# Patient Record
Sex: Female | Born: 1935 | Race: White | Hispanic: No | Marital: Married | Smoking: Never smoker
Health system: Southern US, Community
[De-identification: ages and names within clinical notes are randomized; demographics above are authoritative.]

## PROBLEM LIST (undated history)

## (undated) DIAGNOSIS — I639 Cerebral infarction, unspecified: Secondary | ICD-10-CM

## (undated) DIAGNOSIS — R569 Unspecified convulsions: Secondary | ICD-10-CM

## (undated) HISTORY — DX: Cerebral infarction, unspecified: I63.9

## (undated) HISTORY — PX: APPENDECTOMY: SHX54

## (undated) HISTORY — DX: Unspecified convulsions: R56.9

## (undated) HISTORY — PX: BREAST BIOPSY: SHX20

## (undated) HISTORY — PX: SHOULDER SURGERY: SHX246

---

## 2003-12-21 ENCOUNTER — Emergency Department: Payer: Self-pay | Admitting: Emergency Medicine

## 2003-12-21 ENCOUNTER — Other Ambulatory Visit: Payer: Self-pay

## 2004-05-08 ENCOUNTER — Ambulatory Visit: Payer: Self-pay | Admitting: Internal Medicine

## 2005-09-24 ENCOUNTER — Emergency Department: Payer: Self-pay | Admitting: Emergency Medicine

## 2005-09-24 ENCOUNTER — Other Ambulatory Visit: Payer: Self-pay

## 2005-12-10 ENCOUNTER — Ambulatory Visit: Payer: Self-pay | Admitting: Internal Medicine

## 2005-12-25 ENCOUNTER — Ambulatory Visit: Payer: Self-pay | Admitting: Internal Medicine

## 2006-01-08 ENCOUNTER — Ambulatory Visit: Payer: Self-pay | Admitting: Internal Medicine

## 2006-07-04 ENCOUNTER — Ambulatory Visit: Payer: Self-pay | Admitting: Internal Medicine

## 2008-04-05 ENCOUNTER — Ambulatory Visit: Payer: Self-pay | Admitting: Internal Medicine

## 2008-08-04 ENCOUNTER — Ambulatory Visit: Payer: Self-pay | Admitting: Internal Medicine

## 2009-06-05 ENCOUNTER — Ambulatory Visit: Payer: Self-pay | Admitting: Internal Medicine

## 2010-04-09 ENCOUNTER — Inpatient Hospital Stay: Payer: Self-pay | Admitting: Internal Medicine

## 2010-04-11 ENCOUNTER — Encounter: Payer: Self-pay | Admitting: Internal Medicine

## 2010-04-12 ENCOUNTER — Encounter: Payer: Self-pay | Admitting: Internal Medicine

## 2010-05-13 ENCOUNTER — Encounter: Payer: Self-pay | Admitting: Internal Medicine

## 2010-06-12 ENCOUNTER — Encounter: Payer: Self-pay | Admitting: Internal Medicine

## 2010-06-14 ENCOUNTER — Encounter: Payer: Self-pay | Admitting: Internal Medicine

## 2010-07-13 ENCOUNTER — Encounter: Payer: Self-pay | Admitting: Internal Medicine

## 2010-08-12 ENCOUNTER — Encounter: Payer: Self-pay | Admitting: Internal Medicine

## 2010-09-12 ENCOUNTER — Encounter: Payer: Self-pay | Admitting: Internal Medicine

## 2011-05-14 ENCOUNTER — Ambulatory Visit: Payer: Self-pay | Admitting: Internal Medicine

## 2011-06-09 LAB — COMPREHENSIVE METABOLIC PANEL
Albumin: 3.7 g/dL (ref 3.4–5.0)
Anion Gap: 8 (ref 7–16)
BUN: 11 mg/dL (ref 7–18)
Bilirubin,Total: 0.2 mg/dL (ref 0.2–1.0)
Calcium, Total: 8.1 mg/dL — ABNORMAL LOW (ref 8.5–10.1)
Chloride: 102 mmol/L (ref 98–107)
Creatinine: 0.71 mg/dL (ref 0.60–1.30)
EGFR (African American): >60
EGFR (Non-African Amer.): >60
Glucose: 84 mg/dL (ref 65–99)
Potassium: 4.1 mmol/L (ref 3.5–5.1)
SGOT(AST): 37 U/L (ref 15–37)
SGPT (ALT): 39 U/L
Sodium: 135 mmol/L — ABNORMAL LOW (ref 136–145)

## 2011-06-09 LAB — URINALYSIS, COMPLETE
Bacteria: NONE SEEN
Bilirubin,UR: NEGATIVE
Blood: NEGATIVE
Glucose,UR: NEGATIVE mg/dL (ref 0–75)
Ketone: NEGATIVE
Ph: 7 (ref 4.5–8.0)
Specific Gravity: 1.003 (ref 1.003–1.030)
Squamous Epithelial: 1

## 2011-06-09 LAB — TROPONIN I: Troponin-I: <0.02 ng/mL

## 2011-06-09 LAB — CBC
HCT: 41.6 % (ref 35.0–47.0)
HGB: 13.9 g/dL (ref 12.0–16.0)
MCH: 31.1 pg (ref 26.0–34.0)
MCHC: 33.3 g/dL (ref 32.0–36.0)
MCV: 93 fL (ref 80–100)
Platelet: 197 10*3/uL (ref 150–440)
RDW: 13.1 % (ref 11.5–14.5)

## 2011-06-09 LAB — CK TOTAL AND CKMB (NOT AT ARMC): CK, Total: 78 U/L (ref 21–215)

## 2011-06-09 LAB — PHENYTOIN LEVEL, TOTAL: Dilantin: 19.6 ug/mL (ref 10.0–20.0)

## 2011-06-10 ENCOUNTER — Observation Stay: Payer: Self-pay | Admitting: *Deleted

## 2011-06-10 LAB — CK TOTAL AND CKMB (NOT AT ARMC)
CK, Total: 66 U/L (ref 21–215)
CK-MB: 1.7 ng/mL (ref 0.5–3.6)
CK-MB: 1.5 ng/mL (ref 0.5–3.6)

## 2011-06-10 LAB — PHENOBARBITAL LEVEL: Phenobarbital: 37.3 ug/mL (ref 15.0–40.0)

## 2011-06-11 LAB — MAGNESIUM: Magnesium: 1.9 mg/dL

## 2011-06-11 LAB — CBC WITH DIFFERENTIAL/PLATELET
Basophil #: 0.1 10*3/uL (ref 0.0–0.1)
Basophil %: 1.3 %
Eosinophil #: 0.3 10*3/uL (ref 0.0–0.7)
Eosinophil %: 6 %
HCT: 40.6 % (ref 35.0–47.0)
HGB: 13.7 g/dL (ref 12.0–16.0)
Lymphocyte #: 1.6 10*3/uL (ref 1.0–3.6)
MCH: 31.1 pg (ref 26.0–34.0)
MCV: 92 fL (ref 80–100)
Monocyte #: 0.6 x10 3/mm (ref 0.2–0.9)
Neutrophil %: 41.1 %
RBC: 4.41 10*6/uL (ref 3.80–5.20)

## 2011-06-11 LAB — BASIC METABOLIC PANEL
Anion Gap: 7 (ref 7–16)
BUN: 7 mg/dL (ref 7–18)
Calcium, Total: 8 mg/dL — ABNORMAL LOW (ref 8.5–10.1)
Creatinine: 0.73 mg/dL (ref 0.60–1.30)
EGFR (African American): >60
Glucose: 75 mg/dL (ref 65–99)
Potassium: 4.6 mmol/L (ref 3.5–5.1)

## 2011-06-11 LAB — LIPID PANEL
HDL Cholesterol: 63 mg/dL — ABNORMAL HIGH (ref 40–60)
VLDL Cholesterol, Calc: 16 mg/dL (ref 5–40)

## 2011-06-11 LAB — PHENYTOIN LEVEL, TOTAL: Dilantin: 15.6 ug/mL (ref 10.0–20.0)

## 2011-08-19 ENCOUNTER — Other Ambulatory Visit: Payer: Self-pay | Admitting: Family Medicine

## 2011-08-19 LAB — COMPREHENSIVE METABOLIC PANEL
Anion Gap: 7 (ref 7–16)
Chloride: 100 mmol/L (ref 98–107)
Co2: 28 mmol/L (ref 21–32)
EGFR (African American): >60
EGFR (Non-African Amer.): >60
Osmolality: 269 (ref 275–301)
SGOT(AST): 38 U/L — ABNORMAL HIGH (ref 15–37)
SGPT (ALT): 40 U/L
Sodium: 135 mmol/L — ABNORMAL LOW (ref 136–145)

## 2011-08-19 LAB — CBC WITH DIFFERENTIAL/PLATELET
Basophil %: 1.2 %
Eosinophil %: 3 %
HCT: 41.2 % (ref 35.0–47.0)
HGB: 13.7 g/dL (ref 12.0–16.0)
Lymphocyte #: 1.7 10*3/uL (ref 1.0–3.6)
Lymphocyte %: 29.2 %
Monocyte %: 11.9 %
Neutrophil %: 54.7 %
Platelet: 271 10*3/uL (ref 150–440)
RBC: 4.4 10*6/uL (ref 3.80–5.20)
WBC: 6 10*3/uL (ref 3.6–11.0)

## 2012-05-14 ENCOUNTER — Ambulatory Visit: Payer: Self-pay | Admitting: Internal Medicine

## 2012-09-15 ENCOUNTER — Ambulatory Visit: Payer: Self-pay | Admitting: Internal Medicine

## 2013-06-23 ENCOUNTER — Ambulatory Visit: Payer: Self-pay | Admitting: Internal Medicine

## 2013-07-02 ENCOUNTER — Ambulatory Visit: Payer: Self-pay | Admitting: Internal Medicine

## 2013-09-06 ENCOUNTER — Ambulatory Visit: Payer: Self-pay | Admitting: Internal Medicine

## 2013-11-21 ENCOUNTER — Emergency Department: Payer: Self-pay | Admitting: Emergency Medicine

## 2013-11-21 LAB — CBC WITH DIFFERENTIAL/PLATELET
Basophil #: 0 10*3/uL (ref 0.0–0.1)
Basophil %: 0.5 %
EOS PCT: 1.2 %
Eosinophil #: 0.1 10*3/uL (ref 0.0–0.7)
HCT: 47 % (ref 35.0–47.0)
HGB: 15.4 g/dL (ref 12.0–16.0)
Lymphocyte #: 1.5 10*3/uL (ref 1.0–3.6)
Lymphocyte %: 17.5 %
MCH: 30.6 pg (ref 26.0–34.0)
MCHC: 32.7 g/dL (ref 32.0–36.0)
MCV: 94 fL (ref 80–100)
MONO ABS: 1.1 x10 3/mm — AB (ref 0.2–0.9)
MONOS PCT: 12.4 %
NEUTROS ABS: 5.8 10*3/uL (ref 1.4–6.5)
Neutrophil %: 68.4 %
PLATELETS: 248 10*3/uL (ref 150–440)
RBC: 5.01 10*6/uL (ref 3.80–5.20)
RDW: 12.7 % (ref 11.5–14.5)
WBC: 8.5 10*3/uL (ref 3.6–11.0)

## 2013-11-21 LAB — URINALYSIS, COMPLETE
BLOOD: NEGATIVE
Bilirubin,UR: NEGATIVE
Glucose,UR: NEGATIVE mg/dL (ref 0–75)
Ketone: NEGATIVE
NITRITE: NEGATIVE
PROTEIN: NEGATIVE
Ph: 7 (ref 4.5–8.0)
RBC,UR: 2 /HPF (ref 0–5)
Specific Gravity: 1.006 (ref 1.003–1.030)
Squamous Epithelial: 1
WBC UR: 12 /HPF (ref 0–5)

## 2013-11-21 LAB — BASIC METABOLIC PANEL
ANION GAP: 6 — AB (ref 7–16)
BUN: 9 mg/dL (ref 7–18)
CO2: 27 mmol/L (ref 21–32)
CREATININE: 0.88 mg/dL (ref 0.60–1.30)
Calcium, Total: 8.3 mg/dL — ABNORMAL LOW (ref 8.5–10.1)
Chloride: 107 mmol/L (ref 98–107)
EGFR (Non-African Amer.): >60
Glucose: 111 mg/dL — ABNORMAL HIGH (ref 65–99)
Osmolality: 279 (ref 275–301)
Potassium: 3.9 mmol/L (ref 3.5–5.1)
SODIUM: 140 mmol/L (ref 136–145)

## 2013-11-21 LAB — TROPONIN I

## 2014-06-05 NOTE — Discharge Summary (Signed)
PATIENT NAME:  Brittany Hogan, Brittany Hogan MR#:  [id number] DATE OF BIRTH:  05/16/1935  DATE OF ADMISSION:  06/10/2011 DATE OF DISCHARGE:  06/11/2011  DISCHARGE DIAGNOSES:  1. Lower extremity weakness, most likely secondary to Lipitor. Stroke ruled out. MRI of brain negative for acute ischemia.  2. Seizure disorder. 3. History of left thalamic infarct in February 2012.  4. History of anxiety and depression. 5. History of hyperlipidemia.  CONSULTANTS: Suzan SlickPeter Clarke, MD - Neurology.  HOSPITAL COURSE: This is a 79 year old female who has history of seizure disorder, hyperlipidemia, and history of cerebrovascular accident in February with left thalamic infarct with some residual right-sided weakness, also history of anxiety and depression. She presented with lower extremity weakness going on for 2 to 3 weeks. She says that her legs gave away. She denies any back pain or any lower extremity pain or numbness. She was taking Lipitor in the past and her primary care physician suggested to stop her Lipitor, so she stopped her Lipitor. She was admitted as lower extremity weakness to rule out any stroke. When she came in, she had a CT of the head done that was negative for any acute abnormality. She had a MRI of the brain done and it showed chronic ischemic changes, no acute intracranial abnormality. Diffusion weighted images were negative, no evidence of acute ischemia . We checked vitamin B12 levels on her and her B12 level is normal at 403. We also checked Dilantin levels for her and they are therapeutic, not in the toxic range. Her Dilantin level when she came was 19.6 and today is 15.6. Her phenobarbital levels were also therapeutic, in the range of 37.3. She ruled out for myocardial infarction. Her LFTs are normal. Her creatinine is stable at 0.7. Her LDL is stable at 72. She already stopped Lipitor at home. Her urinalysis has been negative for any infection. Neurology, Dr. Suzan SlickPeter Clarke, saw her and thinks that this is  possibly a side effect of Lipitor, stroke has been ruled out, and this is unlikely secondary to Dilantin toxicity considering the therapeutic Dilantin levels. The patient was evaluated by physical therapy today and she did extremely well. She did walk 300 feet with physical therapy and they suggested that the patient does not have any skilled needs at home and they only suggested home with assist. The patient feels better. Her lower extremity weakness is better. She uses a cane at home.  DISCHARGE MEDICATIONS: Home Medications: 1. Sertraline 50 mg daily. 2. Dilantin 100 mg three times daily, extended-release. 3. Phenobarbital 32.4 mg two tablets in the morning and three at night.  4. Aspirin 325 mg daily. 5. Alendronate 70 mg once a week.   NOTE: Do not take Lipitor.   DIET: Low cholesterol diet.   CONDITION AT DISCHARGE: She is comfortable, no acute distress. She walked with physical therapy 300 feet. T-max 98.7, heart rate 69, blood pressure 122/74, and saturating 96% on room air. Chest is clear. Heart sounds are regular. Abdomen is soft and nontender. She is awake, alert, and oriented x3. She has minimal residual right lower extremity weakness from her prior stroke.   DISCHARGE FOLLOWUP: The patient should follow-up with Dr. Dewaine Oatsenny Tate in 1 to 2 weeks.   TIME SPENT ON DISCHARGE: 40 minutes. ____________________________ Fredia SorrowAbhinav Bethene Hankinson, MD ag:slb D: 06/11/2011 09:50:24 ET T: 06/11/2011 12:49:46 ET JOB#: [id number]  cc: Fredia SorrowAbhinav Rily Nickey, MD, <Dictator> Jillene Bucksenny C. Arlana Pouchate, MD Fredia SorrowABHINAV Denora Wysocki MD ELECTRONICALLY SIGNED 06/19/2011 12:01

## 2014-06-05 NOTE — H&P (Signed)
PATIENT NAME:  Brittany Hogan, Brittany Hogan MR#:  [id number] DATE OF BIRTH:  04-28-1935  DATE OF ADMISSION:  06/10/2011  REFERRING PHYSICIAN: ER physician, Dr. Carollee MassedKaminski PRIMARY CARE PHYSICIAN: Dr. Arlana Pouchate   CHIEF COMPLAINT: Lower extremity weakness.   HISTORY OF PRESENT ILLNESS: Patient is a 79 year old female with past medical history of prior cerebrovascular accident, seizures, anxiety who reports that she has been having progressive bilateral lower extremity weakness for the past 2 to 3 weeks. Her physician thought that it may be related to her Lipitor therefore it was discontinued two weeks ago. Patient reports that she was more or less at her baseline today, did all her usual activities, went to church, however, after dinner she developed sudden onset of extreme bilateral lower extremity weakness, with her legs giving under her and being unable to bear her weight. She denies having any recent falls and inability to bear weight. She reports that her legs are just giving out on her. The ER physician tried ambulating the patient and patient was unable to ambulate in the ED.   PAST MEDICAL HISTORY: 1. Seizures. 2. Hyperlipidemia, patient was taken off statin recently because it was thought to be contributing to her symptoms.  3. Anxiety, depression.  4. History of cerebrovascular accident. MRI of the brain done in February 2012 showed left thalamic infarct. Patient has some residual right-sided weakness. Her carotid ultrasound and echo done at admission were normal.   ALLERGIES: No known drug allergies.   CURRENT MEDICATIONS:  1. Zoloft 50 mg daily.  2. Dilantin 100 mg t.i.d.  3. Phenobarbital 32.4 mg 2 tablets in the morning and 3 tablets in the evening. 4. Aspirin 325 mg daily.  5. Alendronate 70 mg once a week.  6. Patient was taking Lipitor 40 mg a day which was stopped by her PCP about two weeks ago.   PAST SURGICAL HISTORY: Appendectomy.   FAMILY HISTORY: Mother died at the age of 77 of old age.  Father died of myocardial infarction at the age of 77.   SOCIAL HISTORY: Patient is married, lives with her husband. There is no history of smoking, alcohol or drug abuse.   REVIEW OF SYSTEMS: CONSTITUTIONAL: Denies any fever, fatigue. EYES: Denies any blurred or double vision. ENT: Denies any tinnitus, ear pain. RESPIRATORY: Denies any coughing, wheezing. CARDIOVASCULAR: Denies any chest pain, palpitations. GASTROINTESTINAL: Denies any nausea, vomiting, diarrhea, abdominal pain. GENITOURINARY: Denies in dysuria, hematuria. ENDO: Denies any polyuria, nocturia. HEME/LYMPH: Denies any easy bruisability. INTEGUMENT: Denies any acne, rash. MUSCULOSKELETAL: Denies any swelling, gout. NEUROLOGICAL: Denies any dysarthria. Has a prior history of cerebrovascular accident. Reports bilateral lower extremity weakness. PSYCH: Has history of anxiety and depression.   PHYSICAL EXAMINATION:  VITAL SIGNS: Temperature 98.5, heart rate 69, respiratory rate 20, blood pressure 164/89, pulse oximetry 95% on room air.   GENERAL: Patient is a 79 year old Caucasian female lying comfortably in bed, not in acute distress.   HEAD: Atraumatic, normocephalic.   EYES: There is no pallor, icterus, or cyanosis. Pupils are equal, round, and reactive to light and accommodation. Extraocular movements intact.   ENT: Wet mucous membranes. No oropharyngeal erythema or thrush.   NECK: Supple. No masses. No JVD. No thyromegaly. No lymphadenopathy.   CHEST WALL: No tenderness to palpation. Not using accessory muscles of respiration. No intercostal muscle retractions.   LUNGS: Bilaterally clear to auscultation. No wheezing, rales, or rhonchi.   CARDIOVASCULAR: S1, S2 regular. No murmur, rubs, or gallops.   ABDOMEN: Soft, nontender, nondistended. No guarding. No  rigidity. No organomegaly. Normal bowel sounds.   SKIN: No rashes or lesions.   PERIPHERIES: No pedal edema. 2+ pedal pulses.   MUSCULOSKELETAL: No cyanosis or clubbing.    NEUROLOGIC: Patient is awake, alert, oriented x3. Motor strength 5/5 in upper extremities. Cranial nerves grossly intact. In bed patient appears to have motor strength of 5/5 in her lower extremities with intact position sense.   PSYCH: Normal mood and affect.   LABORATORY, DIAGNOSTIC AND RADIOLOGICAL DATA: White count normal. Cardiac enzymes negative. EKG normal. Complete metabolic panel essentially normal. Urinalysis shows no evidence of infection. Dilantin level 19.6. CT of the head shows no acute abnormalities.   ASSESSMENT AND PLAN: 79 year old female with history of seizure disorder, anxiety, depression, prior CVA presents with bilateral lower extremity weakness. 1. Bilateral lower extremity weakness. Patient reported that she has some weakness in her right lower extremity from her prior stroke in February 2012. The current symptoms could be related to cerebrovascular accident/transient ischemic attack but could also be related to Dilantin. Her Dilantin level is therapeutic but it is in the high normal range. No changes in the dose was made recently. Will obtain MRI of the brain, PT consultation. Continued her aspirin. Will get a neurology consultation. Repeat Dilantin level in a.m. Will not repeat a 2-D echo or carotid ultrasound because they were essentially normal in February 2012.  2. Seizure disorder. Will continue Dilantin and phenobarbital.  3. Hyperlipidemia. Patient reports that her Lipitor was discontinued about two weeks ago because it was felt to be contributing to her symptoms. Will hold her Lipitor for the time being and check a fasting lipid profile.  4. Will place on GI and deep vein thrombosis prophylaxis.   Reviewed old medical records, discussed with the ED physician, discussed with the patient and husband the plan of care and management.   TIME SPENT: 75 minutes.   ____________________________ Darrick Meigs, MD sp:cms D: 06/10/2011 00:30:45 ET T: 06/10/2011  08:44:19 ET JOB#: [id number]  cc: Darrick Meigs, MD, <Dictator> Jillene Bucks. Arlana Pouch, MD Darrick Meigs MD ELECTRONICALLY SIGNED 06/15/2011 6:18

## 2014-06-05 NOTE — Consult Note (Signed)
PATIENT NAME:  Brittany Hogan, Brittany Hogan MR#:  [id number] DATE OF BIRTH:  06/02/1935  DATE OF CONSULTATION:  06/10/2011  REFERRING PHYSICIAN:  Dr. Dava Hogan CONSULTING PHYSICIAN:  Brittany PhiPeter R. Kemper Durielarke, MD  HISTORY: Brittany Hogan is a 79 year old right-handed married white patient of Dr. Dewaine Oatsenny Hogan, a former Surveyor, miningpoultry worker with history of Dilantin and phenobarbital treatment for chronic temporal lobe epilepsy, history of anxiety and depression, remote Bell'Hogan palsy in November 2005, and history of  atherosclerotic cerebrovascular disease status post 04/08/2010 left lateral thalamus infarction leaving mild right upper more than lower extremity weakness. The patient is now admitted 06/10/2011 and is referred for evaluation of lower extremity weakness. History comes from the patient, her hospital chart, and her hospital records.   The patient was brought to the Emergency Room at 8:00 p.m. on 06/09/2011 by EMTs summoned to her home secondary to leg weakness. She reports the onset approximately three weeks prior to admission of a feeling of weakness of her legs which gradually worsened to the point that, on getting up from sitting in the afternoon of 06/09/2011, she was unable to walk safely for fear of falling, secondary to legs feeling too weak.   She reports she was taken off of Lipitor 40 mg a day 05/28/2011 by Dr. Arlana Pouchate because of developing weakness feeling of her legs.  Lipitor had been started during her admission to [facility] February to March 2012 for stroke prevention. Her lipid values at that time included cholesterol 177, triglycerides 50, HDL 67 (range 40-60). Along with feeling of weakness of the legs in the past few weeks, the patient denies feeling of weakness of her upper extremities or her face or neck, trouble chewing or swallowing, double vision, eyelids droop, change of vision or hearing, change of bowel/ bladder habits, pain of her legs or arms, or numbness of her limbs or face. She denies vertigo, lightheadedness, or  feeling of off balance with the exception of a feeling a risk of falling secondary to feeling of weakness of her legs or feeling "weak all over". She denies shortness of breath, chest pressure or pain, lower extremity swelling, orthopnea, or PND. Other than discontinuation of Lipitor, she has had no recent change of prescriptions. She remains on phenobarbital 65 mg in the morning, and 100 mg at night, and on Dilantin 100 mg three times a day. Her Dilantin level on admission was 19.6 with albumin 3.7. On 04/09/2010, Dilantin was 18.5 with albumin 3.7. She denies any recent illness, denies having cold or flu symptoms. Brain MRI scan performed this morning showed no acute findings.   EXAMINATION: The patient is a well-developed and mild to moderately overweight elderly white woman who was examined initially lying semisupine, in no apparent distress with blood pressure 140/75 and heart rate 64. There was no fever. She was normocephalic without evidence of trauma. Her neck was supple. Mental status was normal, though cognitive testing was not performed in detail. She was alert and oriented with clear speech and normal expression, and was lucid and a good historian with normal affect. Cranial nerve examination was normal with the exception of very mild asymmetry of facial appearance with lesser nasolabial fold on the right and palpebral fissure a little greater on the left than the right. Eye movements were normal and visual fields were full to finger count for each eye.   On motor examination of the extremities, there was mild increase of tone in the right arm and leg. Strength was five out of five throughout bilaterally  in the arms and legs with the exception of slight weakness of right hand grip and of right ankle dorsiflexion. Reflexes were asymmetric, rated 3+ on the right and only 2+ on the left in the upper extremities, 2 to 3 on the right and 1 to 2 on the left at the knees, 1 on the right versus trace on the  left at the ankles. On coordination examination, there was mild right side hand and foot tapping dysrhythmia, and very mild right finger-to-nose dystaxia, mild impairment of right hand fine finger movements. She was able to sit at the side of the bed without complaint, required one hand assistance to stand and slowly walk forward and backward, reporting feeling of weakness of her legs. With one hand balancing assistance the patient was able to stand on each foot alone.   IMPRESSION:  1. History of remote Bell'Hogan palsy with very mild residual facial asymmetry.  2. History of 04/08/2010 left lateral thalamus infarction leaving mild residual right upper lower extremity impairment of coordination and very mild weakness of right hand grip and ankle dorsiflexion.  3. Recent subacute onset, of approximately three weeks, of problems with feeling of weakness of the legs, suggestive of a side effect of Lipitor treatment. I have low suspicion that this reflects feeling of poor gait steadiness associated with high therapeutic Dilantin level.  Her examination at this point does not suggest myasthenia or other neuromuscular disorder.   RECOMMENDATIONS:  1. Her phenobarbital level will be checked.  2. I agree with her present work-up and treatment in the hospital including imaging and laboratory studies and physical therapy referral.  3. I recommend continuing off of statin treatment.  4. Hold for now on consideration of decreasing her daily Dilantin dose.      I appreciate being asked to see this pleasant and interesting lady.      ____________________________ Brittany Hogan. Kemper Durie, MD prc:bjt D: 06/10/2011 14:20:53 ET T: 06/10/2011 15:44:37 ET JOB#: [id number]  cc: Brittany Hogan. Kemper Durie, MD, <Dictator> Jillene Bucks. Arlana Pouch, MD Gaspar Garbe MD ELECTRONICALLY SIGNED 06/13/2011 16:07

## 2014-06-27 ENCOUNTER — Other Ambulatory Visit: Payer: Self-pay | Admitting: Internal Medicine

## 2014-06-27 DIAGNOSIS — Z1231 Encounter for screening mammogram for malignant neoplasm of breast: Secondary | ICD-10-CM

## 2014-07-08 ENCOUNTER — Ambulatory Visit
Admission: RE | Admit: 2014-07-08 | Discharge: 2014-07-08 | Disposition: A | Discharge status: Home / Self Care | Payer: Medicare HMO | Source: Ambulatory Visit | Attending: Internal Medicine | Admitting: Internal Medicine

## 2014-07-08 ENCOUNTER — Other Ambulatory Visit: Payer: Self-pay | Admitting: Internal Medicine

## 2014-07-08 DIAGNOSIS — Z1231 Encounter for screening mammogram for malignant neoplasm of breast: Secondary | ICD-10-CM | POA: Diagnosis present

## 2014-09-14 ENCOUNTER — Other Ambulatory Visit: Payer: Self-pay | Admitting: Internal Medicine

## 2014-09-14 DIAGNOSIS — R109 Unspecified abdominal pain: Secondary | ICD-10-CM

## 2014-09-16 ENCOUNTER — Ambulatory Visit
Admission: RE | Admit: 2014-09-16 | Discharge: 2014-09-16 | Disposition: A | Discharge status: Home / Self Care | Payer: Medicare HMO | Source: Ambulatory Visit | Attending: Internal Medicine | Admitting: Internal Medicine

## 2014-09-16 DIAGNOSIS — R109 Unspecified abdominal pain: Secondary | ICD-10-CM | POA: Diagnosis present

## 2014-09-16 DIAGNOSIS — R160 Hepatomegaly, not elsewhere classified: Secondary | ICD-10-CM | POA: Diagnosis not present

## 2014-09-21 ENCOUNTER — Encounter: Payer: Self-pay | Admitting: General Surgery

## 2014-09-21 ENCOUNTER — Other Ambulatory Visit: Payer: Medicare HMO

## 2014-09-21 ENCOUNTER — Ambulatory Visit (INDEPENDENT_AMBULATORY_CARE_PROVIDER_SITE_OTHER): Payer: Medicare HMO | Admitting: General Surgery

## 2014-09-21 DIAGNOSIS — K8010 Calculus of gallbladder with chronic cholecystitis without obstruction: Secondary | ICD-10-CM

## 2014-09-21 NOTE — Patient Instructions (Addendum)

## 2014-09-21 NOTE — Progress Notes (Signed)
Patient ID: Brittany Hogan, female   DOB: 12/08/1935, 79 y.o.   MRN: [id number]  Chief Complaint  Patient presents with  . Other    gallbladder    HPI Brittany Hogan is a 79 y.o. female here today for a evaluation of her gallbladder. Patient states she has been going on for about 3 weeks. Right upper abdomen pain that comes and go. She states it last about 10-30 minutes and occurs during the daytime. Not associated with any particular foods. She did have an abdominal ultrasound last week.  HPI  Past Medical History  Diagnosis Date  . Stroke   . Seizures     Past Surgical History  Procedure Laterality Date  . Breast biopsy Right     neg  . Appendectomy      History reviewed. No pertinent family history.  Social History Social History  Substance Use Topics  . Smoking status: Never Smoker   . Smokeless tobacco: Never Used  . Alcohol Use: No    No Known Allergies  Current Outpatient Prescriptions  Medication Sig Dispense Refill  . aspirin 325 MG tablet Take 325 mg by mouth daily.    Marland Kitchen DILANTIN 100 MG ER capsule Take 1 tablet by mouth daily.    Marland Kitchen PHENobarbital (LUMINAL) 64.8 MG tablet Take 1 tablet by mouth 2 (two) times daily.     No current facility-administered medications for this visit.    Review of Systems Review of Systems  Constitutional: Negative.   Respiratory: Negative.   Cardiovascular: Negative.   Gastrointestinal: Positive for constipation. Negative for nausea, vomiting and diarrhea.    Blood pressure 118/64, height  (1.575 m), weight 173 lb (78.472 kg).  Physical Exam Physical Exam  Constitutional: She is oriented to person, place, and time. She appears well-developed and well-nourished.  HENT:  Mouth/Throat: Oropharynx is clear and moist.  Eyes: Conjunctivae are normal. No scleral icterus.  Neck: Neck supple.  Cardiovascular: Normal rate, regular rhythm and normal heart sounds.   Pulmonary/Chest: Effort normal and breath sounds normal.   Abdominal: Soft. Normal appearance and bowel sounds are normal. There is tenderness.  Lymphadenopathy:    She has no cervical adenopathy.  Neurological: She is alert and oriented to person, place, and time.  Skin: Skin is warm and dry.  Psychiatric: Her behavior is normal.    Data Reviewed Notes and ultrasound reviewed.  CBC and MetC from 7/19 normal  Assessment    Chronic cholecystitis and cholelithiasis. Patient seems to have pain consistent with biliary colic      Plan    Laparoscopic Cholecystectomy with Intraoperative Cholangiogram. The procedure, including it's potential risks and complications (including but not limited to infection, bleeding, injury to intra-abdominal organs or bile ducts, bile leak, poor cosmetic result, sepsis and death) were discussed with the patient in detail. Non-operative options, including their inherent risks (acute calculous cholecystitis with possible choledocholithiasis or gallstone pancreatitis, with the risk of ascending cholangitis, sepsis, and death) were discussed as well. The patient expressed and understanding of what we discussed and wishes to proceed with laparoscopic cholecystectomy. The patient further understands that if it is technically not possible, or it is unsafe to proceed laparoscopically, that I will convert to an open cholecystectomy.      Patient's surgery has been scheduled for 09-27-14 at [facility]. This patient has been asked to decrease current 325 mg aspirin to 81 mg aspirin starting tomorrow.   PCP:  Doroteo Glassman G 09/21/2014, 2:13 PM

## 2014-09-22 ENCOUNTER — Inpatient Hospital Stay: Admission: RE | Admit: 2014-09-22 | Payer: Medicare HMO | Source: Ambulatory Visit

## 2014-09-26 ENCOUNTER — Encounter
Admission: RE | Admit: 2014-09-26 | Discharge: 2014-09-26 | Disposition: A | Discharge status: Home / Self Care | Payer: Medicare HMO | Source: Ambulatory Visit | Attending: General Surgery | Admitting: General Surgery

## 2014-09-26 DIAGNOSIS — Z8673 Personal history of transient ischemic attack (TIA), and cerebral infarction without residual deficits: Secondary | ICD-10-CM | POA: Diagnosis not present

## 2014-09-26 DIAGNOSIS — R1010 Upper abdominal pain, unspecified: Secondary | ICD-10-CM | POA: Diagnosis present

## 2014-09-26 DIAGNOSIS — R569 Unspecified convulsions: Secondary | ICD-10-CM | POA: Diagnosis not present

## 2014-09-26 DIAGNOSIS — K219 Gastro-esophageal reflux disease without esophagitis: Secondary | ICD-10-CM | POA: Diagnosis not present

## 2014-09-26 DIAGNOSIS — Z7982 Long term (current) use of aspirin: Secondary | ICD-10-CM | POA: Diagnosis not present

## 2014-09-26 DIAGNOSIS — Z79899 Other long term (current) drug therapy: Secondary | ICD-10-CM | POA: Diagnosis not present

## 2014-09-26 DIAGNOSIS — K8010 Calculus of gallbladder with chronic cholecystitis without obstruction: Secondary | ICD-10-CM | POA: Diagnosis not present

## 2014-09-26 NOTE — Patient Instructions (Signed)
  Your procedure is scheduled on: 09/27/14 Report to Day Surgery. 2nd floor medical mall entrance To find out your arrival time please call [phone] between 1PM - 3PM on TODAY.  Remember: Instructions that are not followed completely may result in serious medical risk, up to and including death, or upon the discretion of your surgeon and anesthesiologist your surgery may need to be rescheduled.    __X__ 1. Do not eat food or drink liquids after midnight. No gum chewing or hard candies.     __X__ 2. No Alcohol for 24 hours before or after surgery.   ____ 3. Bring all medications with you on the day of surgery if instructed.    __X__ 4. Notify your doctor if there is any change in your medical condition     (cold, fever, infections).     Do not wear jewelry, make-up, hairpins, clips or nail polish.  Do not wear lotions, powders, or perfumes. You may wear deodorant.  Do not shave 48 hours prior to surgery. Men may shave face and neck.  Do not bring valuables to the hospital.    [facility] is not responsible for any belongings or valuables.               Contacts, dentures or bridgework may not be worn into surgery.  Leave your suitcase in the car. After surgery it may be brought to your room.  For patients admitted to the hospital, discharge time is determined by your                treatment team.   Patients discharged the day of surgery will not be allowed to drive home.   Please read over the following fact sheets that you were given:   Surgical Site Infection Prevention   __X__ Take these medicines the morning of surgery with A SIP OF WATER:    1. DILANTIN  2. PHENOBARBITOL  3.   4.  5.  6.  ____ Fleet Enema (as directed)   __X__ Use CHG Soap as directed  ____ Use inhalers on the day of surgery  ____ Stop metformin 2 days prior to surgery    ____ Take 1/2 of usual insulin dose the night before surgery and none on the morning of surgery.   __X__ Stop  Coumadin/Plavix/aspirin on Continue aspirin 81 mg as directed by Dr. Bernadette Hoit  ____ Stop Anti-inflammatories on    ____ Stop supplements until after surgery.    ____ Bring C-Pap to the hospital.

## 2014-09-26 NOTE — OR Nursing (Signed)
DR Isabella Stalling REQUESTED PCP REVIEW PREOP EKG. CALLED AND FAXED TO DR TATE. ALSO CALLED TO DR [facility] OFFICE

## 2014-09-27 ENCOUNTER — Ambulatory Visit: Payer: Medicare HMO | Admitting: Registered Nurse

## 2014-09-27 ENCOUNTER — Ambulatory Visit
Admission: RE | Admit: 2014-09-27 | Discharge: 2014-09-27 | Disposition: A | Discharge status: Home / Self Care | Payer: Medicare HMO | Source: Ambulatory Visit | Attending: General Surgery | Admitting: General Surgery

## 2014-09-27 ENCOUNTER — Ambulatory Visit: Payer: Medicare HMO

## 2014-09-27 ENCOUNTER — Encounter: Payer: Self-pay | Admitting: *Deleted

## 2014-09-27 ENCOUNTER — Encounter
Admission: RE | Disposition: A | Discharge status: Home / Self Care | Payer: Self-pay | Source: Ambulatory Visit | Attending: General Surgery

## 2014-09-27 DIAGNOSIS — Z7982 Long term (current) use of aspirin: Secondary | ICD-10-CM | POA: Insufficient documentation

## 2014-09-27 DIAGNOSIS — Z8673 Personal history of transient ischemic attack (TIA), and cerebral infarction without residual deficits: Secondary | ICD-10-CM | POA: Insufficient documentation

## 2014-09-27 DIAGNOSIS — R569 Unspecified convulsions: Secondary | ICD-10-CM | POA: Insufficient documentation

## 2014-09-27 DIAGNOSIS — K8010 Calculus of gallbladder with chronic cholecystitis without obstruction: Secondary | ICD-10-CM | POA: Diagnosis not present

## 2014-09-27 DIAGNOSIS — K219 Gastro-esophageal reflux disease without esophagitis: Secondary | ICD-10-CM | POA: Insufficient documentation

## 2014-09-27 DIAGNOSIS — Z79899 Other long term (current) drug therapy: Secondary | ICD-10-CM | POA: Insufficient documentation

## 2014-09-27 DIAGNOSIS — K8020 Calculus of gallbladder without cholecystitis without obstruction: Secondary | ICD-10-CM

## 2014-09-27 HISTORY — PX: CHOLECYSTECTOMY: SHX55

## 2014-09-27 SURGERY — LAPAROSCOPIC CHOLECYSTECTOMY
Anesthesia: General | Wound class: Clean Contaminated

## 2014-09-27 MED ORDER — LIDOCAINE HCL (CARDIAC) 20 MG/ML IV SOLN
INTRAVENOUS | Status: DC | PRN
  Administered 2014-09-27: 100 mg via INTRAVENOUS

## 2014-09-27 MED ORDER — FENTANYL CITRATE (PF) 100 MCG/2ML IJ SOLN: 25.0000 ug | INTRAMUSCULAR | Status: DC | PRN

## 2014-09-27 MED ORDER — ROCURONIUM BROMIDE 100 MG/10ML IV SOLN
INTRAVENOUS | Status: DC | PRN
  Administered 2014-09-27: 40 mg via INTRAVENOUS
  Administered 2014-09-27: 10 mg via INTRAVENOUS

## 2014-09-27 MED ORDER — CEFAZOLIN SODIUM-DEXTROSE 2-3 GM-% IV SOLR
2.0000 g | INTRAVENOUS | Status: AC
  Administered 2014-09-27: 2 g via INTRAVENOUS

## 2014-09-27 MED ORDER — MIDAZOLAM HCL 2 MG/2ML IJ SOLN
INTRAMUSCULAR | Status: DC | PRN
  Administered 2014-09-27: 1 mg via INTRAVENOUS

## 2014-09-27 MED ORDER — CEFAZOLIN SODIUM-DEXTROSE 2-3 GM-% IV SOLR
INTRAVENOUS | Status: AC
  Administered 2014-09-27: 2 g via INTRAVENOUS
  Filled 2014-09-27: qty 50

## 2014-09-27 MED ORDER — ONDANSETRON HCL 4 MG/2ML IJ SOLN
INTRAMUSCULAR | Status: DC | PRN
  Administered 2014-09-27: 4 mg via INTRAVENOUS

## 2014-09-27 MED ORDER — SODIUM CHLORIDE 0.9 % IJ SOLN
INTRAMUSCULAR | Status: AC
  Filled 2014-09-27: qty 50

## 2014-09-27 MED ORDER — CHLORHEXIDINE GLUCONATE 4 % EX LIQD: 1.0000 "application " | Freq: Once | CUTANEOUS | Status: DC

## 2014-09-27 MED ORDER — EPHEDRINE SULFATE 50 MG/ML IJ SOLN
INTRAMUSCULAR | Status: DC | PRN
  Administered 2014-09-27: 10 mg via INTRAVENOUS

## 2014-09-27 MED ORDER — LACTATED RINGERS IR SOLN
Status: DC | PRN
  Administered 2014-09-27: 100 mL

## 2014-09-27 MED ORDER — GLYCOPYRROLATE 0.2 MG/ML IJ SOLN
INTRAMUSCULAR | Status: DC | PRN
  Administered 2014-09-27: 0.2 mg via INTRAVENOUS

## 2014-09-27 MED ORDER — OXYCODONE-ACETAMINOPHEN 5-325 MG PO TABS: 1.0000 | ORAL_TABLET | ORAL | Status: DC | PRN

## 2014-09-27 MED ORDER — SODIUM CHLORIDE 0.9 % IV SOLN
INTRAVENOUS | Status: DC | PRN
  Administered 2014-09-27: 20 mL

## 2014-09-27 MED ORDER — DEXAMETHASONE SODIUM PHOSPHATE 4 MG/ML IJ SOLN
INTRAMUSCULAR | Status: DC | PRN
  Administered 2014-09-27: 10 mg via INTRAVENOUS

## 2014-09-27 MED ORDER — ONDANSETRON HCL 4 MG/2ML IJ SOLN: 4.0000 mg | Freq: Once | INTRAMUSCULAR | Status: DC | PRN

## 2014-09-27 MED ORDER — SUGAMMADEX SODIUM 200 MG/2ML IV SOLN
INTRAVENOUS | Status: DC | PRN
  Administered 2014-09-27: 200 mg via INTRAVENOUS

## 2014-09-27 MED ORDER — FAMOTIDINE 20 MG PO TABS
20.0000 mg | ORAL_TABLET | Freq: Once | ORAL | Status: AC
  Administered 2014-09-27: 20 mg via ORAL

## 2014-09-27 MED ORDER — FENTANYL CITRATE (PF) 100 MCG/2ML IJ SOLN
INTRAMUSCULAR | Status: DC | PRN
  Administered 2014-09-27: 50 ug via INTRAVENOUS

## 2014-09-27 MED ORDER — PROPOFOL 10 MG/ML IV BOLUS
INTRAVENOUS | Status: DC | PRN
  Administered 2014-09-27: 20 mg via INTRAVENOUS
  Administered 2014-09-27: 130 mg via INTRAVENOUS

## 2014-09-27 MED ORDER — ACETAMINOPHEN 10 MG/ML IV SOLN
INTRAVENOUS | Status: AC
  Filled 2014-09-27: qty 100

## 2014-09-27 MED ORDER — KETOROLAC TROMETHAMINE 30 MG/ML IJ SOLN
INTRAMUSCULAR | Status: DC | PRN
  Administered 2014-09-27: 30 mg via INTRAVENOUS

## 2014-09-27 MED ORDER — FAMOTIDINE 20 MG PO TABS
ORAL_TABLET | ORAL | Status: AC
  Administered 2014-09-27: 20 mg via ORAL
  Filled 2014-09-27: qty 1

## 2014-09-27 MED ORDER — LACTATED RINGERS IV SOLN
INTRAVENOUS | Status: DC
  Administered 2014-09-27: 09:00:00 via INTRAVENOUS

## 2014-09-27 MED ORDER — ACETAMINOPHEN 10 MG/ML IV SOLN
INTRAVENOUS | Status: DC | PRN
  Administered 2014-09-27: 1000 mg via INTRAVENOUS

## 2014-09-27 SURGICAL SUPPLY — 40 items
ANCHOR TIS RET SYS 235ML (MISCELLANEOUS) ×3 IMPLANT
APPLICATOR SURGIFLO (MISCELLANEOUS) IMPLANT
APPLIER CLIP LOGIC TI 5 (MISCELLANEOUS) ×3 IMPLANT
BENZOIN TINCTURE PRP APPL 2/3 (GAUZE/BANDAGES/DRESSINGS) ×3 IMPLANT
BLADE SURG 11 STRL SS SAFETY (MISCELLANEOUS) ×3 IMPLANT
CANISTER SUCT 1200ML W/VALVE (MISCELLANEOUS) ×3 IMPLANT
CANNULA DILATOR 10 W/SLV (CANNULA) ×2 IMPLANT
CANNULA DILATOR 10MM W/SLV (CANNULA) ×1
CATH CHOLANG 76X19 KUMAR (CATHETERS) ×3 IMPLANT
CHLORAPREP W/TINT 26ML (MISCELLANEOUS) ×3 IMPLANT
CLOSURE WOUND 1/2 X4 (GAUZE/BANDAGES/DRESSINGS) ×1
DEFOGGER SCOPE WARMER CLEARIFY (MISCELLANEOUS) ×3 IMPLANT
DRAPE C-ARM XRAY 36X54 (DRAPES) ×3 IMPLANT
DRAPE INCISE IOBAN 66X45 STRL (DRAPES) ×3 IMPLANT
DRESSING TELFA 4X3 1S ST N-ADH (GAUZE/BANDAGES/DRESSINGS) ×3 IMPLANT
DRSG TEGADERM 2-3/8X2-3/4 SM (GAUZE/BANDAGES/DRESSINGS) ×3 IMPLANT
GLOVE BIO SURGEON STRL SZ7 (GLOVE) ×15 IMPLANT
GOWN STRL REUS W/ TWL LRG LVL3 (GOWN DISPOSABLE) ×3 IMPLANT
GOWN STRL REUS W/TWL LRG LVL3 (GOWN DISPOSABLE) ×6
GRASPER SUT TROCAR 14GX15 (MISCELLANEOUS) ×3 IMPLANT
HEMOSTAT SURGICEL 2X3 (HEMOSTASIS) IMPLANT
IRRIGATION STRYKERFLOW (MISCELLANEOUS) ×1 IMPLANT
IRRIGATOR STRYKERFLOW (MISCELLANEOUS) ×3
IV LACTATED RINGERS 1000ML (IV SOLUTION) ×3 IMPLANT
KIT RM TURNOVER STRD PROC AR (KITS) ×3 IMPLANT
LABEL OR SOLS (LABEL) ×3 IMPLANT
NEEDLE INSUFFLATION 150MM (ENDOMECHANICALS) ×3 IMPLANT
PACK LAP CHOLECYSTECTOMY (MISCELLANEOUS) ×3 IMPLANT
PAD GROUND ADULT SPLIT (MISCELLANEOUS) ×3 IMPLANT
SCISSORS METZENBAUM CVD 33 (INSTRUMENTS) ×3 IMPLANT
SLEEVE ENDOPATH XCEL 5M (ENDOMECHANICALS) ×6 IMPLANT
SPOGE SURGIFLO 8M (HEMOSTASIS)
SPONGE SURGIFLO 8M (HEMOSTASIS) IMPLANT
STRIP CLOSURE SKIN 1/2X4 (GAUZE/BANDAGES/DRESSINGS) ×2 IMPLANT
SUT VIC AB 0 SH 27 (SUTURE) ×3 IMPLANT
SUT VIC AB 4-0 FS2 27 (SUTURE) ×3 IMPLANT
SWABSTK COMLB BENZOIN TINCTURE (MISCELLANEOUS) ×3 IMPLANT
TROCAR XCEL NON-BLD 5MMX100MML (ENDOMECHANICALS) ×3 IMPLANT
TROCAR XCEL UNIV SLVE 11M 100M (ENDOMECHANICALS) IMPLANT
TUBING INSUFFLATOR HI FLOW (MISCELLANEOUS) ×3 IMPLANT

## 2014-09-27 NOTE — H&P (View-Only) (Signed)
Patient ID: Brittany Hogan, female   DOB: 05/30/1935, 78 y.o.   MRN: [id number]  Chief Complaint  Patient presents with  . Other    gallbladder    HPI Brittany Hogan is a 78 y.o. female here today for a evaluation of her gallbladder. Patient states she has been going on for about 3 weeks. Right upper abdomen pain that comes and go. She states it last about 10-30 minutes and occurs during the daytime. Not associated with any particular foods. She did have an abdominal ultrasound last week.  HPI  Past Medical History  Diagnosis Date  . Stroke   . Seizures     Past Surgical History  Procedure Laterality Date  . Breast biopsy Right     neg  . Appendectomy      History reviewed. No pertinent family history.  Social History Social History  Substance Use Topics  . Smoking status: Never Smoker   . Smokeless tobacco: Never Used  . Alcohol Use: No    No Known Allergies  Current Outpatient Prescriptions  Medication Sig Dispense Refill  . aspirin 325 MG tablet Take 325 mg by mouth daily.    . DILANTIN 100 MG ER capsule Take 1 tablet by mouth daily.    . PHENobarbital (LUMINAL) 64.8 MG tablet Take 1 tablet by mouth 2 (two) times daily.     No current facility-administered medications for this visit.    Review of Systems Review of Systems  Constitutional: Negative.   Respiratory: Negative.   Cardiovascular: Negative.   Gastrointestinal: Positive for constipation. Negative for nausea, vomiting and diarrhea.    Blood pressure 118/64, height 5' 2" (1.575 m), weight 173 lb (78.472 kg).  Physical Exam Physical Exam  Constitutional: She is oriented to person, place, and time. She appears well-developed and well-nourished.  HENT:  Mouth/Throat: Oropharynx is clear and moist.  Eyes: Conjunctivae are normal. No scleral icterus.  Neck: Neck supple.  Cardiovascular: Normal rate, regular rhythm and normal heart sounds.   Pulmonary/Chest: Effort normal and breath sounds normal.   Abdominal: Soft. Normal appearance and bowel sounds are normal. There is tenderness.  Lymphadenopathy:    She has no cervical adenopathy.  Neurological: She is alert and oriented to person, place, and time.  Skin: Skin is warm and dry.  Psychiatric: Her behavior is normal.    Data Reviewed Notes and ultrasound reviewed.  CBC and MetC from 7/19 normal  Assessment    Chronic cholecystitis and cholelithiasis. Patient seems to have pain consistent with biliary colic      Plan    Laparoscopic Cholecystectomy with Intraoperative Cholangiogram. The procedure, including it's potential risks and complications (including but not limited to infection, bleeding, injury to intra-abdominal organs or bile ducts, bile leak, poor cosmetic result, sepsis and death) were discussed with the patient in detail. Non-operative options, including their inherent risks (acute calculous cholecystitis with possible choledocholithiasis or gallstone pancreatitis, with the risk of ascending cholangitis, sepsis, and death) were discussed as well. The patient expressed and understanding of what we discussed and wishes to proceed with laparoscopic cholecystectomy. The patient further understands that if it is technically not possible, or it is unsafe to proceed laparoscopically, that I will convert to an open cholecystectomy.      Patient's surgery has been scheduled for 09-27-14 at [facility]. This patient has been asked to decrease current 325 mg aspirin to 81 mg aspirin starting tomorrow.   PCP:  Tate, Allen D  Faylinn Schwenn G 09/21/2014, 2:13 PM    

## 2014-09-27 NOTE — Transfer of Care (Signed)
Immediate Anesthesia Transfer of Care Note  Patient: Brittany Hogan  Procedure(s) Performed: Procedure(s): LAPAROSCOPIC CHOLECYSTECTOMY with cholangiogram (N/A)  Patient Location: PACU  Anesthesia Type:General  Level of Consciousness: sedated  Airway & Oxygen Therapy: Patient Spontanous Breathing and Patient connected to face mask oxygen  Post-op Assessment: Report given to RN and Post -op Vital signs reviewed and stable  Post vital signs: Reviewed and stable  Last Vitals:  Filed Vitals:   09/27/14 1036  BP: 117/70  Pulse: 78  Temp: 36.8 C  Resp: 16    Complications: No apparent anesthesia complications

## 2014-09-27 NOTE — Anesthesia Procedure Notes (Signed)
Date/Time: 09/27/2014 9:20 AM Performed by: Stormy Fabian Pre-anesthesia Checklist: Patient identified, Patient being monitored, Timeout performed, Emergency Drugs available and Suction available Patient Re-evaluated:Patient Re-evaluated prior to inductionOxygen Delivery Method: Circle system utilized Preoxygenation: Pre-oxygenation with 100% oxygen Intubation Type: IV induction Ventilation: Mask ventilation without difficulty Laryngoscope Size: Mac and 3 Grade View: Grade I Tube type: Oral Tube size: 7.0 mm Number of attempts: 1 Airway Equipment and Method: Stylet Placement Confirmation: positive ETCO2 and breath sounds checked- equal and bilateral Secured at: 21 cm Tube secured with: Tape Dental Injury: Teeth and Oropharynx as per pre-operative assessment

## 2014-09-27 NOTE — Op Note (Signed)
Preop diagnosis: Chronic cholecystitis and cholelithiasis  Post op diagnosis: Same  Operation: Laparoscopy cholecystectomy and attempted cholangiogram  Surgeon: S.G.Sankar  Assistant:     Anesthesia: Gen.  Complications: None  EBL: Less than 25 mL  Drains: None  Description: Patient was put to sleep in supine position the operating table. Abdomen was prepped and draped as a sterile. Timeout was performed. Port site incision made at the inferior lip of the umbilicus and a Veress needle introduced in the peritoneal cavity verified of the hanging drop method. Pneumoperitoneum was obtained and 10 mm port was placed. Camera was introduced with good visualization visualization the peritoneal cavity. Epigastric and 2 lateral 5 mm ports were placed. The gallbladder was draped with the adhesions tenting up the stomach and the hepatic flexure and transverse colon area. With careful traction and cautery these adhesions were taken down completely. The Hartman's pouch of the gallbladder was lifted up and the cystic duct and artery were easily identified and freed. Cystic artery was hemoclipped and cut and attempt was made for cholangiogram with the Kumar clamp and catheter. However the flow was preferentially going into the gallbladder and the cystic duct could not the filled with the contrast normal bile duct. The cystic duct was fairly long and narrow and was felt that this likely was obstructed at some point. Accordingly the cystic duct was hemoclipped and cut on the gallbladder was then dissected free from its bed using cautery for control of bleeding. After this was freed and the area was irrigated with some fluid and all fluid suctioned out the clips were noted to be intact. The gallbladder was placed in a retrieval bag and brought out through the umbilical port site. It contained several small 4-5 mm round stones. The fascial opening the umbilicus closed 0 Vicryl. After ensuring hemostasis the ports were  removed. All skin incisions were closed with subcuticular 4-0 Vicryl reinforced with tincture benzoin and  Steri-Strips. Sterile dressing with Telfa and Tegaderm placed. Patient subsequently extubated and returned recovery room stable condition.

## 2014-09-27 NOTE — Interval H&P Note (Signed)
History and Physical Interval Note:  09/27/2014 8:42 AM  Brittany Hogan  has presented today for surgery, with the diagnosis of GALLSTONE  The various methods of treatment have been discussed with the patient and family. After consideration of risks, benefits and other options for treatment, the patient has consented to  Procedure(s): LAPAROSCOPIC CHOLECYSTECTOMY (N/A) as a surgical intervention .  The patient's history has been reviewed, patient examined, no change in status, stable for surgery.  I have reviewed the patient's chart and labs.  Questions were answered to the patient's satisfaction.     [name] G

## 2014-09-27 NOTE — Anesthesia Preprocedure Evaluation (Signed)
Anesthesia Evaluation  Patient identified by MRN, date of birth, ID band Patient awake    Reviewed: Allergy & Precautions, H&P , NPO status , Patient's Chart, lab work & pertinent test results, reviewed documented beta blocker date and time   History of Anesthesia Complications Negative for: history of anesthetic complications  Airway Mallampati: II  TM Distance: >3 FB Neck ROM: full    Dental no notable dental hx. (+) Poor Dentition   Pulmonary neg pulmonary ROS,  breath sounds clear to auscultation  Pulmonary exam normal       Cardiovascular Exercise Tolerance: Good negative cardio ROS Normal cardiovascular examRhythm:regular Rate:Normal     Neuro/Psych Seizures -, Well Controlled,  CVA (residual R-sided weakness (LE > UE)), Residual Symptoms negative psych ROS   GI/Hepatic Neg liver ROS, GERD-  ,  Endo/Other  negative endocrine ROS  Renal/GU negative Renal ROS  negative genitourinary   Musculoskeletal   Abdominal   Peds  Hematology negative hematology ROS (+)   Anesthesia Other Findings Past Medical History:   Stroke                                                       Seizures                                                     Reproductive/Obstetrics negative OB ROS                             Anesthesia Physical Anesthesia Plan  ASA: II  Anesthesia Plan: General   Post-op Pain Management:    Induction:   Airway Management Planned:   Additional Equipment:   Intra-op Plan:   Post-operative Plan:   Informed Consent: I have reviewed the patients History and Physical, chart, labs and discussed the procedure including the risks, benefits and alternatives for the proposed anesthesia with the patient or authorized representative who has indicated his/her understanding and acceptance.   Dental Advisory Given  Plan Discussed with: Anesthesiologist, CRNA and  Surgeon  Anesthesia Plan Comments:         Anesthesia Quick Evaluation

## 2014-09-27 NOTE — Discharge Instructions (Signed)

## 2014-09-28 LAB — SURGICAL PATHOLOGY

## 2014-09-28 NOTE — Anesthesia Postprocedure Evaluation (Signed)
  Anesthesia Post-op Note  Patient: Brittany Hogan  Procedure(s) Performed: Procedure(s): LAPAROSCOPIC CHOLECYSTECTOMY with cholangiogram (N/A)  Anesthesia type:General  Patient location: PACU  Post pain: Pain level controlled  Post assessment: Post-op Vital signs reviewed, Patient's Cardiovascular Status Stable, Respiratory Function Stable, Patent Airway and No signs of Nausea or vomiting  Post vital signs: Reviewed and stable  Last Vitals:  Filed Vitals:   09/27/14 1132  BP: 126/73  Pulse: 72  Temp: 36.1 C  Resp: 16    Level of consciousness: awake, alert  and patient cooperative  Complications: No apparent anesthesia complications

## 2014-10-04 ENCOUNTER — Ambulatory Visit (INDEPENDENT_AMBULATORY_CARE_PROVIDER_SITE_OTHER): Payer: Medicare HMO | Admitting: General Surgery

## 2014-10-04 ENCOUNTER — Encounter: Payer: Self-pay | Admitting: General Surgery

## 2014-10-04 VITALS — BP 132/84 | HR 80 | Resp 16 | Ht 62.0 in | Wt 174.0 lb

## 2014-10-04 DIAGNOSIS — K8010 Calculus of gallbladder with chronic cholecystitis without obstruction: Secondary | ICD-10-CM

## 2014-10-04 NOTE — Progress Notes (Signed)
Here today for postoperative visit, laparoscopic cholecystectomy done 09-27-14. Minimal to no pain. She does admit to some constipation, no BM in 2 days.   Port sites healing well, minimal bruising. Lungs clear, abdomen soft.   Miralax as needed for constipation.  The patient is aware to call back for any questions or concerns. Follow up as needed.  PCP:  Dewaine Oats

## 2014-10-04 NOTE — Patient Instructions (Signed)
The patient is aware to call back for any questions or concerns.  

## 2015-01-30 ENCOUNTER — Emergency Department
Admission: EM | Admit: 2015-01-30 | Discharge: 2015-01-30 | Disposition: A | Discharge status: Home / Self Care | Payer: Medicare HMO | Attending: Emergency Medicine | Admitting: Emergency Medicine

## 2015-01-30 ENCOUNTER — Encounter: Payer: Self-pay | Admitting: Emergency Medicine

## 2015-01-30 DIAGNOSIS — Z79899 Other long term (current) drug therapy: Secondary | ICD-10-CM | POA: Diagnosis not present

## 2015-01-30 DIAGNOSIS — Z7982 Long term (current) use of aspirin: Secondary | ICD-10-CM | POA: Diagnosis not present

## 2015-01-30 DIAGNOSIS — K5900 Constipation, unspecified: Secondary | ICD-10-CM | POA: Diagnosis not present

## 2015-01-30 MED ORDER — POLYETHYLENE GLYCOL 3350 17 G PO PACK: 17.0000 g | PACK | Freq: Every day | ORAL | Status: AC | PRN

## 2015-01-30 MED ORDER — MINERAL OIL RE ENEM
1.0000 | ENEMA | Freq: Once | RECTAL | Status: AC
  Administered 2015-01-30: 1 via RECTAL

## 2015-01-30 MED ORDER — DOCUSATE SODIUM 50 MG/5ML PO LIQD
100.0000 mg | Freq: Once | ORAL | Status: AC
  Administered 2015-01-30: 100 mg
  Filled 2015-01-30: qty 10

## 2015-01-30 MED ORDER — MAGNESIUM CITRATE PO SOLN
150.0000 mL | Freq: Once | ORAL | Status: AC
  Administered 2015-01-30: 150 mL
  Filled 2015-01-30: qty 296

## 2015-01-30 NOTE — ED Notes (Signed)
Pt had minimal output of feces after enema X2. States that her stomach does feel better however.

## 2015-01-30 NOTE — Discharge Instructions (Signed)

## 2015-01-30 NOTE — ED Notes (Signed)
Pt given drink. Awaiting colace from pharmacy.

## 2015-01-30 NOTE — ED Notes (Signed)
Pt c/o constipation X 6 days. Constipation followed diarrhea that the patient was experiencing before. Pt c/o abdominal pain when trying to go to the bathroom. Stool softeners have not helped patient. Pt has not tried enema.

## 2015-01-30 NOTE — ED Notes (Signed)
Pt on toilet at this time 

## 2015-01-30 NOTE — ED Provider Notes (Signed)
[facility] Emergency Department Provider Note  ____________________________________________  Time seen: Approximately 1120 AM  I have reviewed the triage vital signs and the nursing notes.   HISTORY  Chief Complaint Constipation    HPI Brittany Hogan is a 79 y.o. female with a history of constipation who is presenting today without a bowel movement over the past 6 days. She says she has had intermittent abdominal pain over this time. She says that she was having a diarrheal illness which ceased. She took 1 dose of loperamide to control her diarrhea. She is passing gas. No nausea or vomiting.Says that she has been using prune is without any relief.   Past Medical History  Diagnosis Date  . Stroke (HCC)   . Seizures (HCC)     There are no active problems to display for this patient.   Past Surgical History  Procedure Laterality Date  . Breast biopsy Right     neg  . Appendectomy    . Shoulder surgery Right   . Cholecystectomy N/A 09/27/2014    Procedure: LAPAROSCOPIC CHOLECYSTECTOMY with cholangiogram;  Surgeon: Kieth BrightlySeeplaputhur G Sankar, MD;  Location: [facility] ORS;  Service: General;  Laterality: N/A;    Current Outpatient Rx  Name  Route  Sig  Dispense  Refill  . alendronate (FOSAMAX) 70 MG tablet   Oral   Take 70 mg by mouth once a week. On Tuesday         . aspirin EC 325 MG tablet   Oral   Take 325 mg by mouth daily.         Marland Kitchen. PHENobarbital (LUMINAL) 64.8 MG tablet   Oral   Take 64.8-97.2 tablets by mouth See admin instructions. 64.8 mg every morning and 97.2 every evening         . phenytoin (DILANTIN) 100 MG ER capsule   Oral   Take 100 mg by mouth 3 (three) times daily.           Allergies Review of patient's allergies indicates no known allergies.  No family history on file.  Social History Social History  Substance Use Topics  . Smoking status: Never Smoker   . Smokeless tobacco: Never Used  . Alcohol Use: No     Review of Systems Constitutional: No fever/chills Eyes: No visual changes. ENT: No sore throat. Cardiovascular: Denies chest pain. Respiratory: Denies shortness of breath. Gastrointestinal: No nausea, no vomiting.   Genitourinary: Negative for dysuria. Musculoskeletal: Negative for back pain. Skin: Negative for rash. Neurological: Negative for headaches, focal weakness or numbness.  10-point ROS otherwise negative.  ____________________________________________   PHYSICAL EXAM:  VITAL SIGNS: ED Triage Vitals  Enc Vitals Group     BP 01/30/15 0913 153/76 mmHg     Pulse Rate 01/30/15 0913 77     Resp 01/30/15 0913 18     Temp 01/30/15 0913 98.3 F (36.8 C)     Temp Source 01/30/15 0913 Oral     SpO2 01/30/15 0913 97 %     Weight 01/30/15 0913 170 lb (77.111 kg)     Height 01/30/15 0913 5\' 2"  (1.575 m)     Head Cir --      Peak Flow --      Pain Score 01/30/15 0923 6     Pain Loc --      Pain Edu? --      Excl. in GC? --     Constitutional: Alert and oriented. Well appearing and in no acute  distress. Eyes: Conjunctivae are normal. PERRL. EOMI. Head: Atraumatic. Nose: No congestion/rhinnorhea. Mouth/Throat: Mucous membranes are moist.  Oropharynx non-erythematous. Neck: No stridor.   Cardiovascular: Normal rate, regular rhythm. Grossly normal heart sounds.  Good peripheral circulation. Respiratory: Normal respiratory effort.  No retractions. Lungs CTAB. Gastrointestinal: Soft and nontender. No distention. No abdominal bruits. No CVA tenderness. Rectal exam without fecal impaction. Musculoskeletal: No lower extremity tenderness nor edema.  No joint effusions. Neurologic:  Normal speech and language. No gross focal neurologic deficits are appreciated. No gait instability. Skin:  Skin is warm, dry and intact. No rash noted. Psychiatric: Mood and affect are normal. Speech and behavior are normal.  ____________________________________________   LABS (all labs  ordered are listed, but only abnormal results are displayed)  Labs Reviewed - No data to display ____________________________________________  EKG   ____________________________________________  RADIOLOGY   ____________________________________________   PROCEDURES   ____________________________________________   INITIAL IMPRESSION / ASSESSMENT AND PLAN / ED COURSE  Pertinent labs & imaging results that were available during my care of the patient were reviewed by me and considered in my medical decision making (see chart for details).  ----------------------------------------- 2:40 PM on 01/30/2015 -----------------------------------------  Patient had small bowel movement after enemas. Patient was reexamined and the abdomen is nondistended and persistently soft. I discussed with the patient that I would've expected there to be more stool production after 2 enemas. I discussed with the patient and we could work her up further with labs and imaging but the patient said that she would rather try laxatives at home. We discussed eating a diet rich in fruits and vegetables as well as trying MiraLAX at home. I discussed with the patient coming back to the hospital she does not have more stool production over the next 24 hours as there may be another cause for this such as a mass. Discussed other return precautions chest pain nausea and vomiting. ____________________________________________   FINAL CLINICAL IMPRESSION(S) / ED DIAGNOSES  Constipation.    Myrna Blazer, MD 01/30/15 [phone]

## 2015-01-30 NOTE — ED Notes (Signed)
Pt alert and oriented X4, active, cooperative, pt in NAD. RR even and unlabored, color WNL.  Pt informed to return if any life threatening symptoms occur.   

## 2015-01-30 NOTE — ED Notes (Signed)
Pt presents with constipation for about five days now, has tried otc meds with no relief.

## 2015-02-01 ENCOUNTER — Emergency Department
Admission: EM | Admit: 2015-02-01 | Discharge: 2015-02-01 | Disposition: A | Discharge status: Home / Self Care | Payer: Medicare HMO | Attending: Emergency Medicine | Admitting: Emergency Medicine

## 2015-02-01 ENCOUNTER — Encounter: Payer: Self-pay | Admitting: Emergency Medicine

## 2015-02-01 ENCOUNTER — Emergency Department: Payer: Medicare HMO

## 2015-02-01 DIAGNOSIS — S32020A Wedge compression fracture of second lumbar vertebra, initial encounter for closed fracture: Secondary | ICD-10-CM

## 2015-02-01 DIAGNOSIS — S3991XA Unspecified injury of abdomen, initial encounter: Secondary | ICD-10-CM | POA: Diagnosis not present

## 2015-02-01 DIAGNOSIS — S32028A Other fracture of second lumbar vertebra, initial encounter for closed fracture: Secondary | ICD-10-CM | POA: Diagnosis not present

## 2015-02-01 DIAGNOSIS — Y92009 Unspecified place in unspecified non-institutional (private) residence as the place of occurrence of the external cause: Secondary | ICD-10-CM | POA: Diagnosis not present

## 2015-02-01 DIAGNOSIS — Y9389 Activity, other specified: Secondary | ICD-10-CM | POA: Diagnosis not present

## 2015-02-01 DIAGNOSIS — K5901 Slow transit constipation: Secondary | ICD-10-CM | POA: Insufficient documentation

## 2015-02-01 DIAGNOSIS — S3992XA Unspecified injury of lower back, initial encounter: Secondary | ICD-10-CM | POA: Diagnosis present

## 2015-02-01 DIAGNOSIS — R7989 Other specified abnormal findings of blood chemistry: Secondary | ICD-10-CM | POA: Insufficient documentation

## 2015-02-01 DIAGNOSIS — Y998 Other external cause status: Secondary | ICD-10-CM | POA: Insufficient documentation

## 2015-02-01 DIAGNOSIS — W1839XA Other fall on same level, initial encounter: Secondary | ICD-10-CM | POA: Diagnosis not present

## 2015-02-01 DIAGNOSIS — R778 Other specified abnormalities of plasma proteins: Secondary | ICD-10-CM

## 2015-02-01 LAB — URINALYSIS COMPLETE WITH MICROSCOPIC (ARMC ONLY)
BACTERIA UA: NONE SEEN
Bilirubin Urine: NEGATIVE
Glucose, UA: NEGATIVE mg/dL
HGB URINE DIPSTICK: NEGATIVE
Ketones, ur: NEGATIVE mg/dL
Leukocytes, UA: NEGATIVE
Nitrite: NEGATIVE
PH: 7 (ref 5.0–8.0)
PROTEIN: NEGATIVE mg/dL
Specific Gravity, Urine: 1.011 (ref 1.005–1.030)

## 2015-02-01 LAB — COMPREHENSIVE METABOLIC PANEL
ALK PHOS: 82 U/L (ref 38–126)
ALT: 24 U/L (ref 14–54)
AST: 29 U/L (ref 15–41)
Albumin: 3.7 g/dL (ref 3.5–5.0)
Anion gap: 7 (ref 5–15)
BUN: 9 mg/dL (ref 6–20)
CALCIUM: 8.3 mg/dL — AB (ref 8.9–10.3)
CHLORIDE: 104 mmol/L (ref 101–111)
CO2: 24 mmol/L (ref 22–32)
CREATININE: 0.76 mg/dL (ref 0.44–1.00)
GFR calc non Af Amer: >60 mL/min (ref >60)
Glucose, Bld: 121 mg/dL — ABNORMAL HIGH (ref 65–99)
Potassium: 3.9 mmol/L (ref 3.5–5.1)
SODIUM: 135 mmol/L (ref 135–145)
Total Bilirubin: 0.7 mg/dL (ref 0.3–1.2)
Total Protein: 6.9 g/dL (ref 6.5–8.1)

## 2015-02-01 LAB — CBC WITH DIFFERENTIAL/PLATELET
BASOS ABS: 0 10*3/uL (ref 0–0.1)
Basophils Relative: 1 %
EOS ABS: 0.2 10*3/uL (ref 0–0.7)
Eosinophils Relative: 2 %
HCT: 42.4 % (ref 35.0–47.0)
HEMOGLOBIN: 13.8 g/dL (ref 12.0–16.0)
LYMPHS ABS: 1.1 10*3/uL (ref 1.0–3.6)
LYMPHS PCT: 13 %
MCH: 30 pg (ref 26.0–34.0)
MCHC: 32.5 g/dL (ref 32.0–36.0)
MCV: 92.2 fL (ref 80.0–100.0)
Monocytes Absolute: 1.1 10*3/uL — ABNORMAL HIGH (ref 0.2–0.9)
Monocytes Relative: 13 %
Neutro Abs: 6.1 10*3/uL (ref 1.4–6.5)
Neutrophils Relative %: 71 %
Platelets: 221 10*3/uL (ref 150–440)
RBC: 4.6 MIL/uL (ref 3.80–5.20)
RDW: 13.1 % (ref 11.5–14.5)
WBC: 8.5 10*3/uL (ref 3.6–11.0)

## 2015-02-01 LAB — TROPONIN I
TROPONIN I: 0.07 ng/mL — AB (ref <0.031)
TROPONIN I: 0.07 ng/mL — AB (ref <0.031)

## 2015-02-01 LAB — LIPASE, BLOOD: Lipase: 15 U/L (ref 11–51)

## 2015-02-01 MED ORDER — IOHEXOL 300 MG/ML  SOLN
100.0000 mL | Freq: Once | INTRAMUSCULAR | Status: AC | PRN
  Administered 2015-02-01: 100 mL via INTRAVENOUS

## 2015-02-01 MED ORDER — TRAMADOL HCL 50 MG PO TABS: 50.0000 mg | ORAL_TABLET | Freq: Four times a day (QID) | ORAL | Status: AC | PRN

## 2015-02-01 MED ORDER — IOHEXOL 240 MG/ML SOLN
25.0000 mL | Freq: Once | INTRAMUSCULAR | Status: AC | PRN
  Administered 2015-02-01: 25 mL via INTRAVENOUS

## 2015-02-01 MED ORDER — PEG 3350-KCL-NA BICARB-NACL 420 G PO SOLR: 4000.0000 mL | Freq: Once | ORAL | Status: AC

## 2015-02-01 NOTE — ED Provider Notes (Signed)
[facility] Emergency Department Provider Note     Time seen: ----------------------------------------- 8:21 AM on 02/01/2015 -----------------------------------------    I have reviewed the triage vital signs and the nursing notes.   HISTORY  Chief Complaint Abdominal Pain and Back Pain    HPI Brittany Hogan is a 79 y.o. female who presents ER for abdominal pain and low back pain is started about a week ago after she fell at home. She was seen here in the ER 2 days ago for abdominal pain and back pain was thought to have constipation. She was sent home after receiving an enema and directions to take MiraLAX. Patient states she did have some relief but has not a bowel movement since Monday, states pain is worsened. She points to her lower abdomen, nothing makes it better. Having some back pain is better when she lays down.   Past Medical History  Diagnosis Date  . Stroke (HCC)   . Seizures (HCC)     There are no active problems to display for this patient.   Past Surgical History  Procedure Laterality Date  . Breast biopsy Right     neg  . Appendectomy    . Shoulder surgery Right   . Cholecystectomy N/A 09/27/2014    Procedure: LAPAROSCOPIC CHOLECYSTECTOMY with cholangiogram;  Surgeon: Kieth Brightly, MD;  Location: [facility] ORS;  Service: General;  Laterality: N/A;    Allergies Review of patient's allergies indicates no known allergies.  Social History Social History  Substance Use Topics  . Smoking status: Never Smoker   . Smokeless tobacco: Never Used  . Alcohol Use: No    Review of Systems Constitutional: Negative for fever. Eyes: Negative for visual changes. ENT: Negative for sore throat. Cardiovascular: Negative for chest pain. Respiratory: Negative for shortness of breath. Gastrointestinal: Positive for abdominal pain, positive for constipation Genitourinary: Negative for dysuria. Musculoskeletal: Positive for low back  pain Skin: Negative for rash. Neurological: Negative for headaches, focal weakness or numbness.  10-point ROS otherwise negative.  ____________________________________________   PHYSICAL EXAM:  VITAL SIGNS: ED Triage Vitals  Enc Vitals Group     BP 02/01/15 0813 154/89 mmHg     Pulse Rate 02/01/15 0813 82     Resp 02/01/15 0814 18     Temp 02/01/15 0813 97.9 F (36.6 C)     Temp Source 02/01/15 0813 Oral     SpO2 02/01/15 0813 94 %     Weight 02/01/15 0813 170 lb (77.111 kg)     Height 02/01/15 0813  (1.575 m)     Head Cir --      Peak Flow --      Pain Score 02/01/15 0814 8     Pain Loc --      Pain Edu? --      Excl. in GC? --     Constitutional: Alert and oriented. Well appearing and in no distress. Eyes: Conjunctivae are normal. PERRL. Normal extraocular movements. ENT   Head: Normocephalic and atraumatic.   Nose: No congestion/rhinnorhea.   Mouth/Throat: Mucous membranes are moist.   Neck: No stridor. Cardiovascular: Normal rate, regular rhythm. Normal and symmetric distal pulses are present in all extremities. No murmurs, rubs, or gallops. Respiratory: Normal respiratory effort without tachypnea nor retractions. Breath sounds are clear and equal bilaterally. No wheezes/rales/rhonchi. Gastrointestinal: Nonfocal abdominal tenderness, no rebound or guarding. Normal bowel sounds.  [facility] range of motion in all extremities. No joint effusions.  No lower  extremity tenderness nor edema. Neurologic:  Normal speech and language. No gross focal neurologic deficits are appreciated. Speech is normal. No gait instability. Skin:  Skin is warm, dry and intact. No rash noted. Psychiatric: Mood and affect are normal. Speech and behavior are normal. Patient exhibits appropriate insight and judgment.  ____________________________________________  ED COURSE:  Pertinent labs & imaging results that were available during my care of the  patient were reviewed by me and considered in my medical decision making (see chart for details). Patient is in no acute distress, will check abdominal labs and likely imaging  EKG: Interpreted by me, normal sinus rhythm with a rate of 70 bpm, normal PR interval, normal QS with, normal QT interval. There are anterolateral ST and T wave changes. ____________________________________________    LABS (pertinent positives/negatives)  Labs Reviewed  CBC WITH DIFFERENTIAL/PLATELET - Abnormal; Notable for the following:    Monocytes Absolute 1.1 (*)    All other components within normal limits  COMPREHENSIVE METABOLIC PANEL - Abnormal; Notable for the following:    Glucose, Bld 121 (*)    Calcium 8.3 (*)    All other components within normal limits  URINALYSIS COMPLETEWITH MICROSCOPIC ([facility] ONLY) - Abnormal; Notable for the following:    Color, Urine YELLOW (*)    APPearance CLEAR (*)    Squamous Epithelial / LPF 0-5 (*)    All other components within normal limits  TROPONIN I - Abnormal; Notable for the following:    Troponin I 0.07 (*)    All other components within normal limits  TROPONIN I - Abnormal; Notable for the following:    Troponin I 0.07 (*)    All other components within normal limits  LIPASE, BLOOD    RADIOLOGY Images were viewed by me  CT abdomen and pelvis  IMPRESSION: Changes consistent with a subacute compression deformity at L2. This is likely the etiology of the patient's underlying pain.  Chronic changes without acute abnormality. ____________________________________________  FINAL ASSESSMENT AND PLAN  Abdominal pain, elevated troponin  Plan: Patient with labs and imaging as dictated above. Patient with an L2 compression fracture from recent fall. Troponin here is elevated of uncertain etiology. EKG hasn't changed from prior. Troponin has not changed from one checked in the next 3 to be discharged with pain medicine and go lightly to improve her  constipation.   Hser Belanger, JonEmily Filbertathan E, MD   Emily FilbertJonathan E Jacson Rapaport, MD 02/01/15 1300

## 2015-02-01 NOTE — Discharge Instructions (Signed)
Constipation, Adult Constipation is when a person has fewer than three bowel movements a week, has difficulty having a bowel movement, or has stools that are dry, hard, or larger than normal. As people grow older, constipation is more common. A low-fiber diet, not taking in enough fluids, and taking certain medicines may make constipation worse.  CAUSES   Certain medicines, such as antidepressants, pain medicine, iron supplements, antacids, and water pills.   Certain diseases, such as diabetes, irritable bowel syndrome (IBS), thyroid disease, or depression.   Not drinking enough water.   Not eating enough fiber-rich foods.   Stress or travel.   Lack of physical activity or exercise.   Ignoring the urge to have a bowel movement.   Using laxatives too much.  SIGNS AND SYMPTOMS   Having fewer than three bowel movements a week.   Straining to have a bowel movement.   Having stools that are hard, dry, or larger than normal.   Feeling full or bloated.   Pain in the lower abdomen.   Not feeling relief after having a bowel movement.  DIAGNOSIS  Your health care provider will take a medical history and perform a physical exam. Further testing may be done for severe constipation. Some tests may include:  A barium enema X-ray to examine your rectum, colon, and, sometimes, your small intestine.   A sigmoidoscopy to examine your lower colon.   A colonoscopy to examine your entire colon. TREATMENT  Treatment will depend on the severity of your constipation and what is causing it. Some dietary treatments include drinking more fluids and eating more fiber-rich foods. Lifestyle treatments may include regular exercise. If these diet and lifestyle recommendations do not help, your health care provider may recommend taking over-the-counter laxative medicines to help you have bowel movements. Prescription medicines may be prescribed if over-the-counter medicines do not work.    HOME CARE INSTRUCTIONS   Eat foods that have a lot of fiber, such as fruits, vegetables, whole grains, and beans.  Limit foods high in fat and processed sugars, such as french fries, hamburgers, cookies, candies, and soda.   A fiber supplement may be added to your diet if you cannot get enough fiber from foods.   Drink enough fluids to keep your urine clear or pale yellow.   Exercise regularly or as directed by your health care provider.   Go to the restroom when you have the urge to go. Do not hold it.   Only take over-the-counter or prescription medicines as directed by your health care provider. Do not take other medicines for constipation without talking to your health care provider first.  SEEK IMMEDIATE MEDICAL CARE IF:   You have bright red blood in your stool.   Your constipation lasts for more than 4 days or gets worse.   You have abdominal or rectal pain.   You have thin, pencil-like stools.   You have unexplained weight loss. MAKE SURE YOU:   Understand these instructions.  Will watch your condition.  Will get help right away if you are not doing well or get worse.   This information is not intended to replace advice given to you by your health care provider. Make sure you discuss any questions you have with your health care provider.   Document Released: 10/27/2003 Document Revised: 02/18/2014 Document Reviewed: 11/09/2012 Elsevier Interactive Patient Education 2016 [employer]  Vertebral Fracture A vertebral fracture is a break in one of the bones that make up the spine (vertebrae).  The vertebrae are stacked on top of each other to form the spinal column. They support the body and protect the spinal cord. The vertebral column has an upper part (cervical spine), a middle part (thoracic spine), and a lower part (lumbar spine). Most vertebral fractures occur in the thoracic spine or lumbar spine. There are three main types of vertebral  fractures:  Flexion fracture. This happens when vertebrae collapse. Vertebrae can collapse:  In the front (compression fracture). This type of fracture is common in people who have a condition that causes their bones to be weak and brittle (osteoporosis). The fracture can make a person lose height.  In the front and back (axial burst fracture).  Extension fracture. This happens when an external force pulls apart the vertebrae.  Rotation fracture. This happens when the spine bends extremely in one direction. This type can cause a piece of a vertebra to break off (transverse process fracture) or move out of its normal position (fracture dislocation). This type of fracture has a high risk for spinal cord injury. Vertebral fractures can range from mild to very severe. The most severe types are those that cause the broken bones to move out of place (unstable) and those that injure or press on the spinal cord. CAUSES This condition is usually caused by a forceful injury. This type of injury commonly results from:  Car accidents.  Falling or jumping from a great height.  Collisions in contact sports.  Violent acts, such as an assault or a gunshot wound. RISK FACTORS This injury is more likely to happen to people who:  Have osteoporosis.  Participate in contact sports.  Are in situations that could result in falls or other violent injuries. SYMPTOMS Symptoms of this injury depend on the location and the type of fracture. The most common symptom is back pain that gets worse with movement. You may also have trouble standing or walking. If a fracture has damaged your spinal cord or is pressing on it, you may also have:  Numbness.  Tingling.  Weakness.  Loss of movement.  Loss of bowel or bladder control. DIAGNOSIS This injury may be diagnosed based on symptoms, medical history, and a physical exam. You may also have imaging tests to confirm the diagnosis. These may include:  Spine  X-ray.  CT scan.  MRI. TREATMENT Treatment for this injury depends on the type of fracture. If your fracture is stable and does not affect your spinal cord, it may heal with nonsurgical treatment, such as:  Taking pain medicine.  Wearing a cast or a brace.  Doing physical therapy exercises. If your vertebral fracture is unstable or it affects your spinal cord, you may need surgical treatment, such as:  Laminectomy. This procedure involves removing the part of a vertebra that is pushing on the spinal cord (spinal decompression surgery). Bone fragments may also be removed.  Spinal fusion. This procedure is used to stabilize an unstable fracture. Vertebrae may be joined together with a piece of bone from another part of your body (graft) and held in place with rods, plates, or screws.  Vertebroplasty. In this procedure, bone cement is used to rebuild collapsed vertebrae. HOME CARE INSTRUCTIONS General Instructions  Take medicines only as directed by your health care provider.  Do not drive or operate heavy machinery while taking pain medicine.  If directed, apply ice to the injured area:  Put ice in a plastic bag.  Place a towel between your skin and the bag.  Leave the  ice on for 30 minutes every two hours at first. Then apply the ice as needed.  Wear your neck brace or back brace as directed by your health care provider.  Do not drink alcohol. Alcohol can interfere with your treatment.  Keep all follow-up visits as directed by your health care provider. This is important. It can help to prevent permanent injury, disability, and long-lasting (chronic) pain. Activity  Stay in bed (on bed rest) only as directed by your health care provider. Being on bed rest for too long can make your condition worse.  Return to your normal activities as directed by your health care provider. Ask what activities are safe for you.  Do exercises to improve motion and strength in your back  (physical therapy), as recommended by your health care provider.   Exercise regularly as directed by your health care provider. SEEK MEDICAL CARE IF:  You have a fever.  You develop a cough that makes your pain worse.  Your pain medicine is not helping.  Your pain does not get better over time.  You cannot return to your normal activities as planned or expected. SEEK IMMEDIATE MEDICAL CARE IF:  Your pain is very bad and it suddenly gets worse.  You are unable to move any body part (paralysis) that is below the level of your injury.  You have numbness, tingling, or weakness in any body part that is below the level of your injury.  You cannot control your bladder or bowels.   This information is not intended to replace advice given to you by your health care provider. Make sure you discuss any questions you have with your health care provider.   Document Released: 03/07/2004 Document Revised: 06/14/2014 Document Reviewed: 02/02/2014 Elsevier Interactive Patient Education [employer]

## 2015-02-01 NOTE — ED Notes (Signed)
Patient transported to CT 

## 2015-02-01 NOTE — ED Notes (Signed)
Patient brought to ED by her husband due to lower abdominal and lower back pain that started 1 week ago after patient fell at home.  Pt was seen in [facility] ED 2 days ago for the abdominal and back pain, and was thought to have had constipation, and was sent home after receiving enema and directions to take miralax.  Pt states she did have some relief, but has not had a bowel movement since Monday.  Pt continuing to have pain.

## 2015-02-23 ENCOUNTER — Other Ambulatory Visit: Payer: Self-pay | Admitting: Physician Assistant

## 2015-02-23 DIAGNOSIS — R932 Abnormal findings on diagnostic imaging of liver and biliary tract: Secondary | ICD-10-CM

## 2015-03-08 ENCOUNTER — Ambulatory Visit
Admission: RE | Admit: 2015-03-08 | Discharge: 2015-03-08 | Disposition: A | Discharge status: Home / Self Care | Payer: Medicare HMO | Source: Ambulatory Visit | Attending: Physician Assistant | Admitting: Physician Assistant

## 2015-03-08 DIAGNOSIS — K7689 Other specified diseases of liver: Secondary | ICD-10-CM | POA: Insufficient documentation

## 2015-03-08 DIAGNOSIS — R109 Unspecified abdominal pain: Secondary | ICD-10-CM | POA: Diagnosis not present

## 2015-03-08 DIAGNOSIS — K449 Diaphragmatic hernia without obstruction or gangrene: Secondary | ICD-10-CM | POA: Diagnosis not present

## 2015-03-08 DIAGNOSIS — R932 Abnormal findings on diagnostic imaging of liver and biliary tract: Secondary | ICD-10-CM | POA: Insufficient documentation

## 2015-03-08 MED ORDER — GADOBENATE DIMEGLUMINE 529 MG/ML IV SOLN
20.0000 mL | Freq: Once | INTRAVENOUS | Status: AC | PRN
  Administered 2015-03-08: 16 mL via INTRAVENOUS

## 2015-07-12 ENCOUNTER — Other Ambulatory Visit: Payer: Self-pay | Admitting: Internal Medicine

## 2015-07-12 ENCOUNTER — Ambulatory Visit
Admission: RE | Admit: 2015-07-12 | Discharge: 2015-07-12 | Disposition: A | Discharge status: Home / Self Care | Payer: Medicare HMO | Source: Ambulatory Visit | Attending: Internal Medicine | Admitting: Internal Medicine

## 2015-07-12 DIAGNOSIS — M5137 Other intervertebral disc degeneration, lumbosacral region: Secondary | ICD-10-CM | POA: Diagnosis not present

## 2015-07-12 DIAGNOSIS — M5489 Other dorsalgia: Secondary | ICD-10-CM

## 2015-07-12 DIAGNOSIS — M549 Dorsalgia, unspecified: Secondary | ICD-10-CM | POA: Diagnosis present

## 2015-07-17 ENCOUNTER — Other Ambulatory Visit: Payer: Self-pay | Admitting: Internal Medicine

## 2015-07-17 DIAGNOSIS — M8580 Other specified disorders of bone density and structure, unspecified site: Secondary | ICD-10-CM

## 2015-07-18 ENCOUNTER — Other Ambulatory Visit: Payer: Self-pay | Admitting: Internal Medicine

## 2015-07-18 DIAGNOSIS — M8580 Other specified disorders of bone density and structure, unspecified site: Secondary | ICD-10-CM

## 2015-08-17 ENCOUNTER — Ambulatory Visit
Admission: RE | Admit: 2015-08-17 | Discharge: 2015-08-17 | Disposition: A | Discharge status: Home / Self Care | Payer: Medicare HMO | Source: Ambulatory Visit | Attending: Internal Medicine | Admitting: Internal Medicine

## 2015-08-17 DIAGNOSIS — Z13820 Encounter for screening for osteoporosis: Secondary | ICD-10-CM | POA: Insufficient documentation

## 2015-08-17 DIAGNOSIS — M8580 Other specified disorders of bone density and structure, unspecified site: Secondary | ICD-10-CM | POA: Insufficient documentation

## 2016-01-31 ENCOUNTER — Encounter: Payer: Self-pay | Admitting: Obstetrics and Gynecology

## 2016-10-29 ENCOUNTER — Ambulatory Visit (INDEPENDENT_AMBULATORY_CARE_PROVIDER_SITE_OTHER): Payer: Medicare HMO | Admitting: Obstetrics & Gynecology

## 2016-10-29 ENCOUNTER — Encounter: Payer: Self-pay | Admitting: Obstetrics & Gynecology

## 2016-10-29 VITALS — BP 138/80 | HR 69 | Ht 62.0 in | Wt 163.0 lb

## 2016-10-29 DIAGNOSIS — N898 Other specified noninflammatory disorders of vagina: Secondary | ICD-10-CM | POA: Insufficient documentation

## 2016-10-29 DIAGNOSIS — R1030 Lower abdominal pain, unspecified: Secondary | ICD-10-CM | POA: Insufficient documentation

## 2016-10-29 DIAGNOSIS — Z124 Encounter for screening for malignant neoplasm of cervix: Secondary | ICD-10-CM

## 2016-10-29 NOTE — Patient Instructions (Signed)
Vulva/Vagina Biopsy, Care After Refer to this sheet in the next few weeks. These instructions provide you with information about caring for yourself after your procedure. Your health care provider may also give you more specific instructions. Your treatment has been planned according to current medical practices, but problems sometimes occur. Call your health care provider if you have any problems or questions after your procedure. What can I expect after the procedure? After the procedure, it is common to have:  Slight bleeding from the biopsy site.  Discomfort at the biopsy site.  Follow these instructions at home: Biopsy Site Care   Do not rub the biopsy area after urinating. Gently pat the area dry or use a bottle filled with warm water (peri-bottle) to clean the area. Gently wipe from front to back.  Follow instructions from your health care provider about how to take care of your biopsy site. Make sure you: ? Clean the area using water and mild soap twice a day or as told by your health care provider. Gently pat the area dry. ? If you were prescribed an antibiotic medical ointment, apply it as told by your health care provider. Do not stop using the antibiotic even if your condition improves. ? Take a warm water bath that is taken while you are sitting down (sitz bath) as needed to help with pain or discomfort. ? Leave stitches (sutures), skin glue, or adhesive strips in place. These skin closures may need to stay in place for 2 weeks or longer. If adhesive strip edges start to loosen and curl up, you may trim the loose edges. Do not remove adhesive strips completely unless your health care provider tells you to do that.  Check your biopsy site every day for signs of infection. Check for: ? More redness, swelling, or pain. ? More fluid or blood. ? Warmth. ? Pus or a bad smell. Lifestyle  Wear loose, cotton underwear. Do not wear tight pants.  Do not use a tampon, douche, or put  anything inside your vagina for at least one week or until your health care provider approves.  Do not have sex for at least one week or until your health care provider approves.  Do not exercise, such as running or biking, until your health care provider approves.  Do not take baths, swim, or use a hot tub until your health care provider approves. General instructions  Take over-the-counter and prescription medicines only as told by your health care provider.  Use a sanitary napkin until bleeding stops.  Keep all follow-up visits as told by your health care provider. This is important.  If the sample is being sent for testing, it is your responsibility to get the results of your procedure. Ask your health care provider or the department performing the procedure when your results will be ready. Contact a health care provider if:  You have more redness, swelling, or pain around your biopsy site.  You have more fluid or blood coming from your biopsy site.  Your biopsy site feels warm to the touch.  Your pain is not controlled with medicine. Get help right away if:  You have heavy bleeding from the vulva.  You have pus or a bad smell coming from your biopsy site.  You have a fever.  You have lower belly pain. This information is not intended to replace advice given to you by your health care provider. Make sure you discuss any questions you have with your health care provider. Document  You have a fever.   You have lower belly pain.  This information is not intended to replace advice given to you by your health care provider. Make sure you discuss any questions you have with your health care provider.  Document Released: 01/15/2012 Document Revised: 07/06/2015 Document Reviewed: 12/19/2014  Elsevier Interactive Patient Education  2018 Elsevier Inc.

## 2016-10-29 NOTE — Progress Notes (Signed)
HPI:      [name] is a 81 y.o. G2P2, No LMP recorded. Patient is postmenopausal., presents today for a problem visit.  She complains of:  Vulvar concern:   This is a 81 y.o. old Caucasian/White female who presents for the evaluation 2 weeks h/o lower abdominal discomfort and fatigue/lethargy.  No VB.  Not sexually active.  Prior stroke.  Has occas hemorrhoidal light bleeding.   PMHx: She  has a past medical history of Seizures (HCC) and Stroke (HCC). Also,  has a past surgical history that includes Breast biopsy (Right); Appendectomy; Shoulder surgery (Right); and Cholecystectomy (N/A, 09/27/2014)., family history is not on file.,  reports that she has never smoked. She has never used smokeless tobacco. She reports that she does not drink alcohol or use drugs.  She has a current medication list which includes the following prescription(s): phenytoin, alendronate, aspirin ec, hydrochlorothiazide, pantoprazole, phenobarbital, polyethylene glycol, and polyethylene glycol-electrolytes. Also, has No Known Allergies.  Review of Systems  Constitutional: Negative for chills, fever and malaise/fatigue.  HENT: Negative for congestion, sinus pain and sore throat.   Eyes: Negative for blurred vision and pain.  Respiratory: Negative for cough and wheezing.   Cardiovascular: Negative for chest pain and leg swelling.  Gastrointestinal: Negative for abdominal pain, constipation, diarrhea, heartburn, nausea and vomiting.  Genitourinary: Negative for dysuria, frequency, hematuria and urgency.  Musculoskeletal: Negative for back pain, joint pain, myalgias and neck pain.  Skin: Negative for itching and rash.  Neurological: Negative for dizziness, tremors and weakness.  Endo/Heme/Allergies: Does not bruise/bleed easily.  Psychiatric/Behavioral: Negative for depression. The patient is not nervous/anxious and does not have insomnia.    Objective: BP 138/80   Pulse 69   Ht  (1.575 m)   Wt 163 lb  (73.9 kg)   BMI 29.81 kg/m  Physical Exam  Constitutional: She is oriented to person, place, and time. She appears well-developed and well-nourished. No distress.  Genitourinary: Vagina normal and uterus normal. Pelvic exam was performed with patient supine. There is no rash, tenderness or lesion on the right labia. There is no rash, tenderness or lesion on the left labia. No erythema or bleeding in the vagina. Right adnexum does not display mass and does not display tenderness. Left adnexum does not display mass and does not display tenderness. Cervix does not exhibit motion tenderness, discharge, polyp or nabothian cyst.   Uterus is mobile and midaxial. Uterus is not enlarged or exhibiting a mass.  Genitourinary Comments: Cyst like lesions of vulva and vagina.   Posterior vagina near introitus feels firm with lesion.    This is where biopsy done Cervix normal, vagina atrophy No sig prolapse  Abdominal: Soft. She exhibits no distension. There is no tenderness.  Musculoskeletal: Normal range of motion.  Neurological: She is alert and oriented to person, place, and time. No cranial nerve deficit.  Skin: Skin is warm and dry.  Psychiatric: She has a normal mood and affect.    ASSESSMENT/PLAN:    Problem List Items Addressed This Visit      Other   Lower abdominal pain - Primary   Vaginal lesion   Relevant Orders   Pathology    Other Visit Diagnoses    Screening for cervical cancer       Relevant Orders   Pap IG (Image Guided)    VAGINAL BIOPSY NOTE The indications for vulvar biopsy (rule out neoplasia or cancer) were reviewed.   Risks of the biopsy including pain,  bleeding, infection, inadequate specimen, scarring and need for additional procedures  were discussed. The patient stated understanding and agreed to undergo procedure today. Consent was signed,  time out performed.   The patient's vagina was prepped with Betadine. 1% lidocaine was injected into area of concern. A 3 -mm  punch biopsy was done, biopsy tissue was picked up with sterile forceps and sterile scissors were used to excise the lesion.  Small bleeding was noted and hemostasis was achieved using suture.  The patient tolerated the procedure well. Post-procedure instructions  (pelvic rest for one week) were given to the patient. The patient is to call with heavy bleeding, fever greater than 100.4, foul smelling vaginal discharge or other concerns.    Annamarie Major, MD, [facility], [facility] 10/29/2016  3:40 PM

## 2016-10-31 ENCOUNTER — Telehealth: Payer: Self-pay | Admitting: Obstetrics & Gynecology

## 2016-10-31 LAB — PATHOLOGY

## 2016-10-31 NOTE — Telephone Encounter (Signed)
[name] Husband [name] is calling back for his wife Charmain due to missed call. Please advise

## 2016-10-31 NOTE — Progress Notes (Signed)
LM

## 2016-10-31 NOTE — Telephone Encounter (Signed)
D/W pt.

## 2016-11-01 LAB — PAP IG (IMAGE GUIDED): PAP SMEAR COMMENT: 0

## 2016-12-31 IMAGING — US US ABDOMEN COMPLETE
1 series · 14 of 25 positions shown · non-contrast
Comparison: None.

CLINICAL DATA: Right-sided abdominal pain for 3 weeks

EXAM:
ULTRASOUND ABDOMEN COMPLETE

[Series 1: us abdomen complete · 0.25mm/px · 14 of 97 slices shown]
[im 1/97]
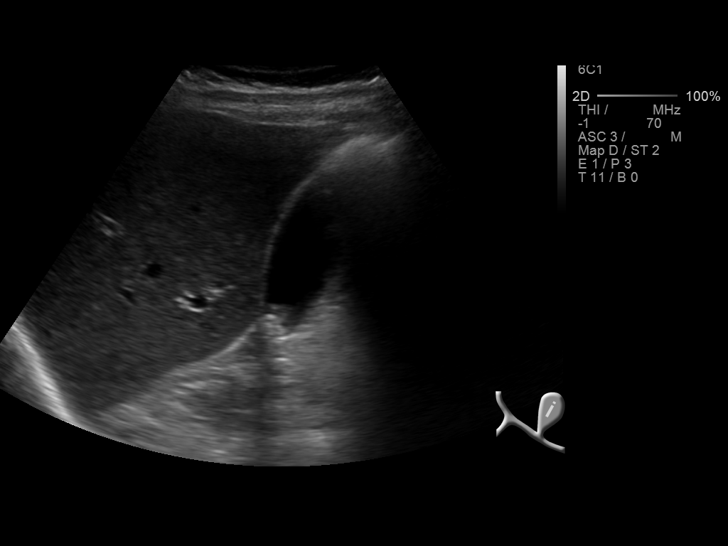
[im 9/97]
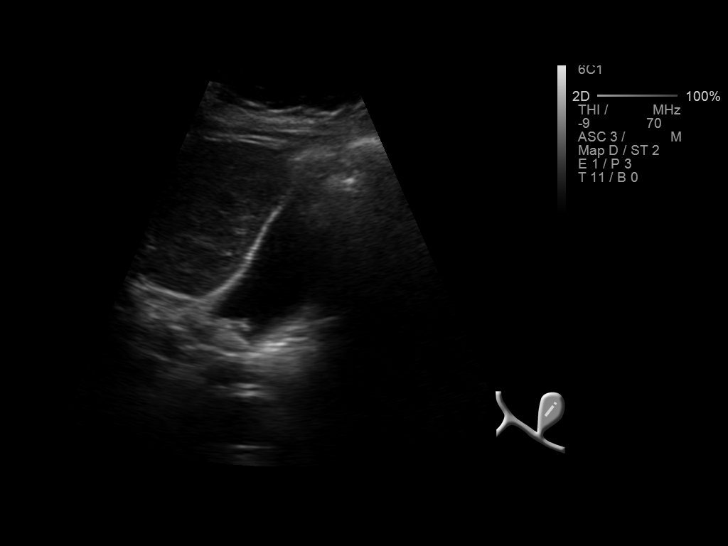
[im 17/97]
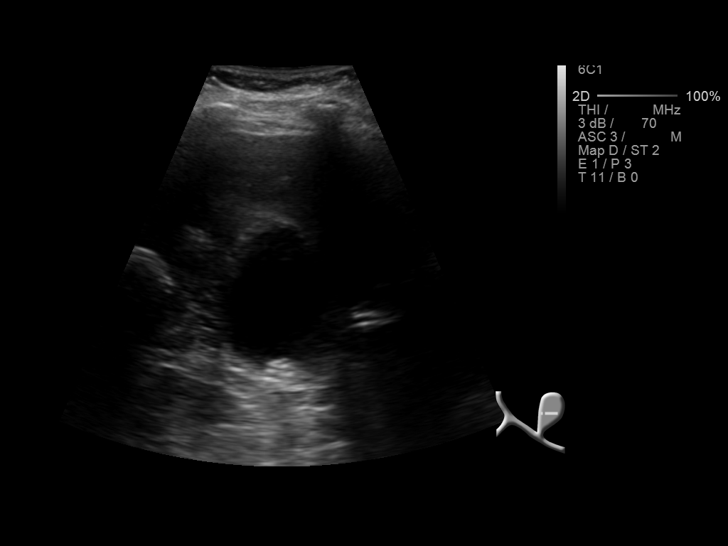
[im 25/97]
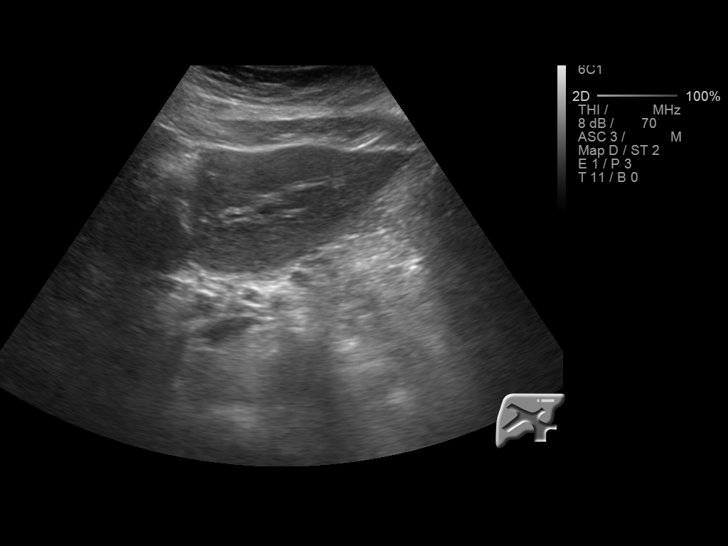
[im 33/97]
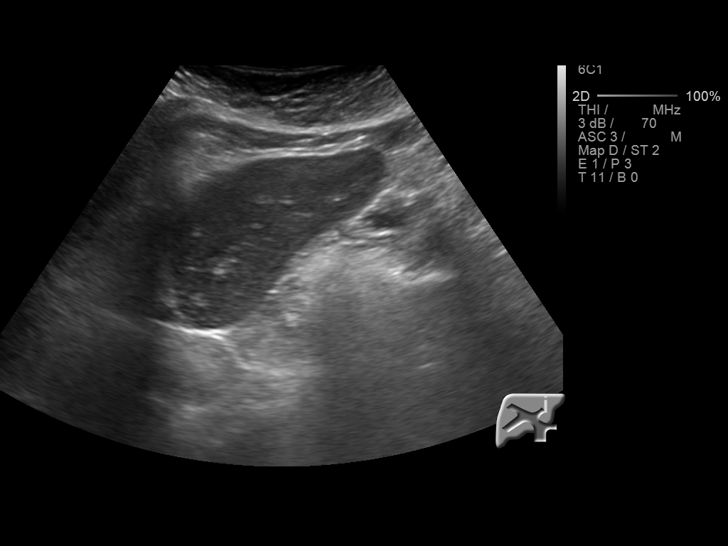
[im 37/97]
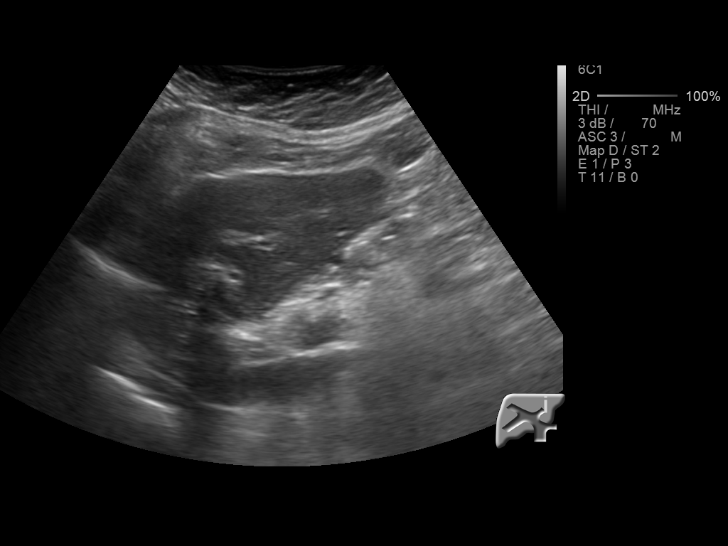
[im 45/97]
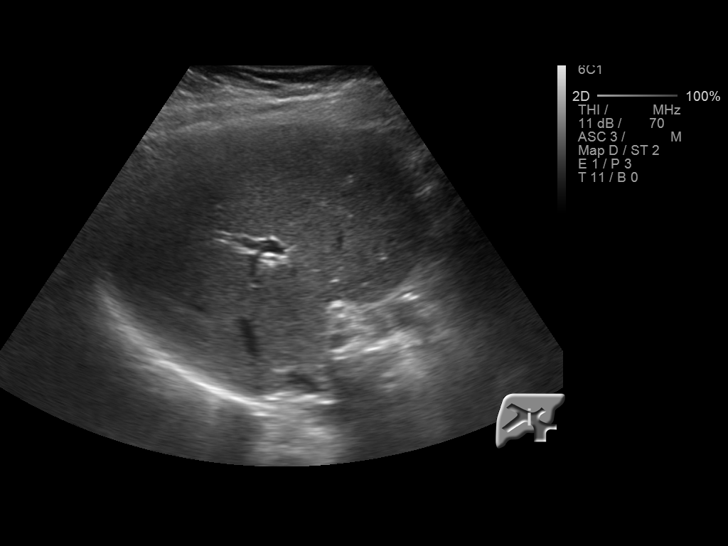
[im 53/97]
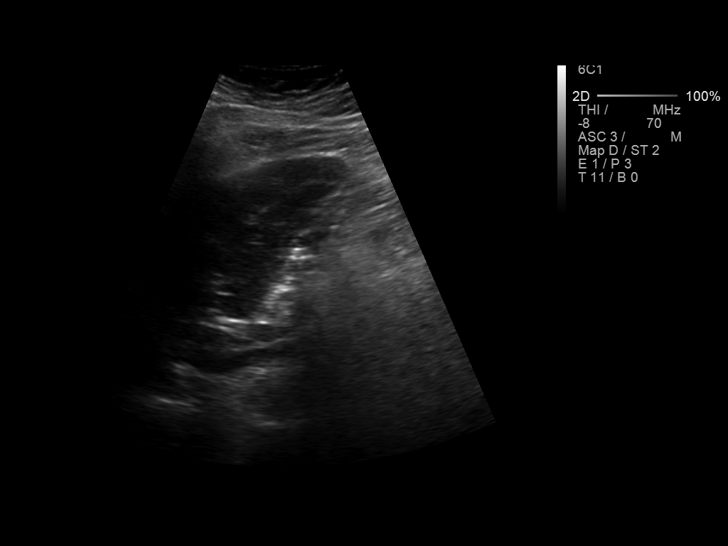
[im 61/97]
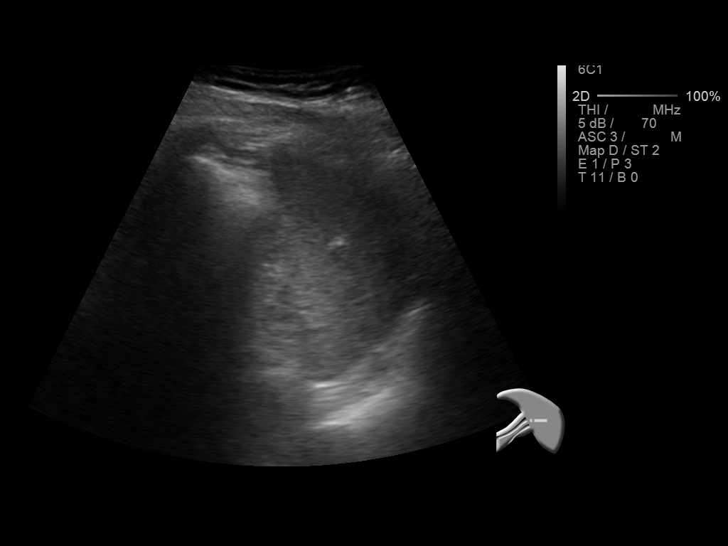
[im 65/97]
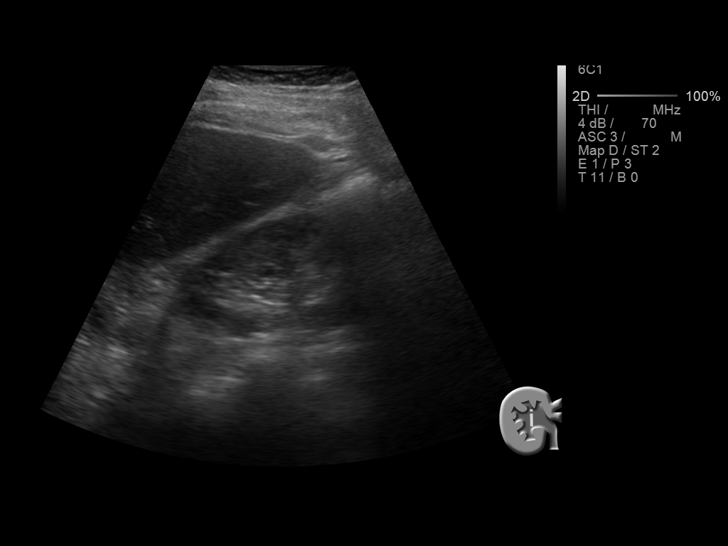
[im 73/97]
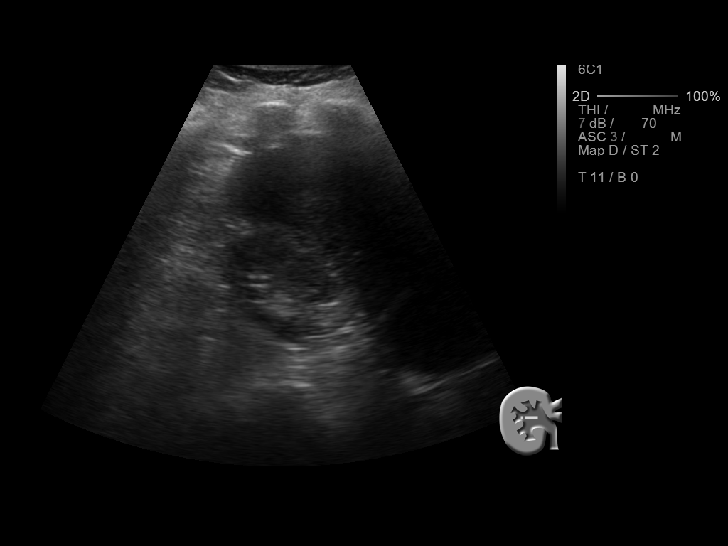
[im 81/97]
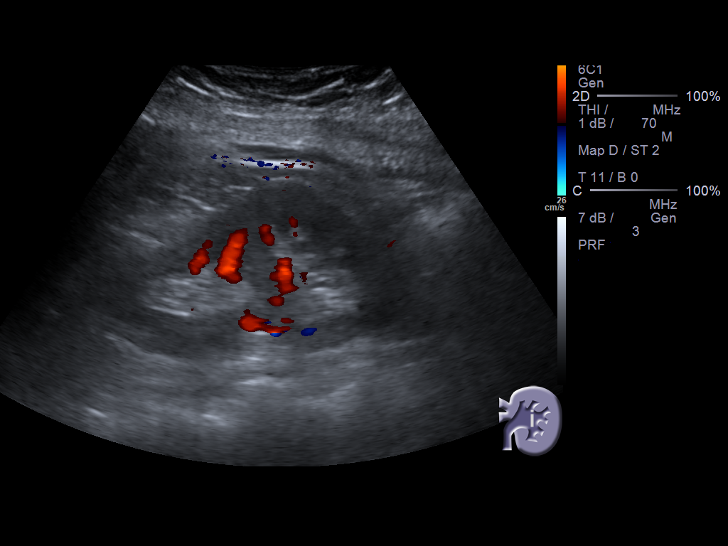
[im 89/97]
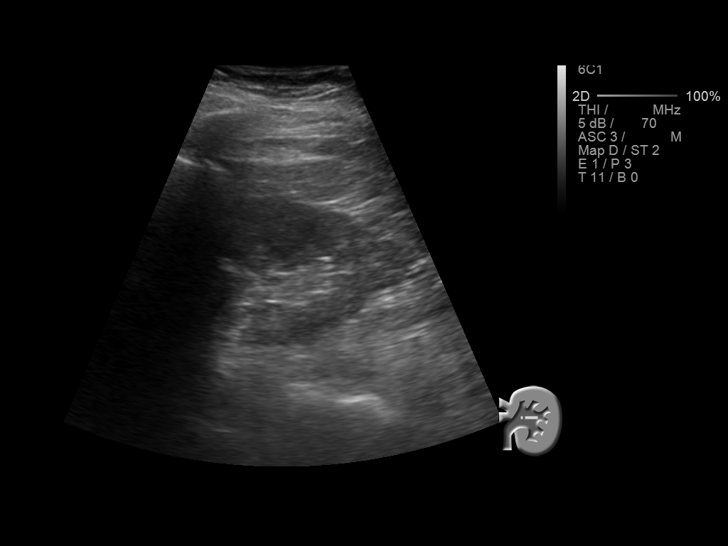
[im 97/97]
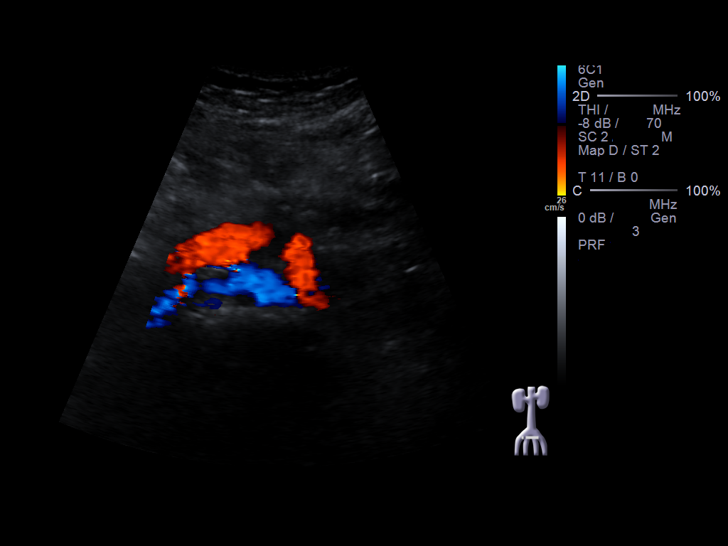

[14 of 25 positions shown; findings below may reference images not displayed]

FINDINGS: Gallbladder: There are several mobile echogenic oval structures
within the gallbladder, but they do not cause significant posterior
acoustic shadowing. The largest is 1 cm. Gallbladder wall is not
thickened. There is no Murphy's sign.

Common bile duct: Diameter: 3 mm

Liver: 1 cm oval echogenic mass right lobe.

IVC: No abnormality visualized.

Pancreas: Visualized portion unremarkable.

Spleen: Size and appearance within normal limits.

Right Kidney: Length: 8.5 cm. Echogenicity within normal limits. No
mass or hydronephrosis visualized.

Left Kidney: Length: 9.9 cm. Echogenicity within normal limits. No
mass or hydronephrosis visualized.

Abdominal aorta: No aneurysm visualized.

Other findings: None.
IMPRESSION: Numerous gallbladder filling defects likely representing
noncalcified calculi or foci of tumefactive sludge.

1 cm mass right liver. Differential diagnostic possibilities include
hemangioma or metastatic lesion. Further evaluation with hepatic MRI
suggested.

## 2017-07-07 ENCOUNTER — Inpatient Hospital Stay
Admission: EM | Admit: 2017-07-07 | Discharge: 2017-07-10 | DRG: 481 | Disposition: A | Discharge status: Skilled Nursing Facility | Payer: Medicare HMO | Source: Other Acute Inpatient Hospital | Attending: Internal Medicine | Admitting: Internal Medicine

## 2017-07-07 ENCOUNTER — Other Ambulatory Visit: Payer: Self-pay

## 2017-07-07 DIAGNOSIS — Z7982 Long term (current) use of aspirin: Secondary | ICD-10-CM | POA: Diagnosis not present

## 2017-07-07 DIAGNOSIS — Z9049 Acquired absence of other specified parts of digestive tract: Secondary | ICD-10-CM

## 2017-07-07 DIAGNOSIS — K219 Gastro-esophageal reflux disease without esophagitis: Secondary | ICD-10-CM | POA: Diagnosis present

## 2017-07-07 DIAGNOSIS — Y92009 Unspecified place in unspecified non-institutional (private) residence as the place of occurrence of the external cause: Secondary | ICD-10-CM | POA: Diagnosis not present

## 2017-07-07 DIAGNOSIS — M810 Age-related osteoporosis without current pathological fracture: Secondary | ICD-10-CM | POA: Diagnosis present

## 2017-07-07 DIAGNOSIS — M25552 Pain in left hip: Secondary | ICD-10-CM | POA: Diagnosis present

## 2017-07-07 DIAGNOSIS — W1830XA Fall on same level, unspecified, initial encounter: Secondary | ICD-10-CM | POA: Diagnosis present

## 2017-07-07 DIAGNOSIS — I69341 Monoplegia of lower limb following cerebral infarction affecting right dominant side: Secondary | ICD-10-CM | POA: Diagnosis not present

## 2017-07-07 DIAGNOSIS — Z79899 Other long term (current) drug therapy: Secondary | ICD-10-CM | POA: Diagnosis not present

## 2017-07-07 DIAGNOSIS — S72009A Fracture of unspecified part of neck of unspecified femur, initial encounter for closed fracture: Secondary | ICD-10-CM

## 2017-07-07 DIAGNOSIS — E871 Hypo-osmolality and hyponatremia: Secondary | ICD-10-CM | POA: Diagnosis present

## 2017-07-07 DIAGNOSIS — E876 Hypokalemia: Secondary | ICD-10-CM | POA: Diagnosis present

## 2017-07-07 DIAGNOSIS — S72002A Fracture of unspecified part of neck of left femur, initial encounter for closed fracture: Secondary | ICD-10-CM

## 2017-07-07 DIAGNOSIS — R609 Edema, unspecified: Secondary | ICD-10-CM

## 2017-07-07 DIAGNOSIS — Z7983 Long term (current) use of bisphosphonates: Secondary | ICD-10-CM

## 2017-07-07 DIAGNOSIS — Z419 Encounter for procedure for purposes other than remedying health state, unspecified: Secondary | ICD-10-CM

## 2017-07-07 DIAGNOSIS — S72142A Displaced intertrochanteric fracture of left femur, initial encounter for closed fracture: Principal | ICD-10-CM | POA: Diagnosis present

## 2017-07-07 DIAGNOSIS — G40909 Epilepsy, unspecified, not intractable, without status epilepticus: Secondary | ICD-10-CM | POA: Diagnosis present

## 2017-07-07 MED ORDER — ONDANSETRON HCL 4 MG PO TABS
4.0000 mg | ORAL_TABLET | Freq: Four times a day (QID) | ORAL | Status: DC | PRN
Start: 2017-07-07 — End: 2017-07-10

## 2017-07-07 MED ORDER — DOCUSATE SODIUM 100 MG PO CAPS: 100.0000 mg | ORAL_CAPSULE | Freq: Two times a day (BID) | ORAL | Status: DC

## 2017-07-07 MED ORDER — ACETAMINOPHEN 650 MG RE SUPP: 650.0000 mg | Freq: Four times a day (QID) | RECTAL | Status: DC | PRN

## 2017-07-07 MED ORDER — ALENDRONATE SODIUM 70 MG PO TABS: 70.0000 mg | ORAL_TABLET | ORAL | Status: DC

## 2017-07-07 MED ORDER — SODIUM CHLORIDE 0.9 % IV SOLN
INTRAVENOUS | Status: DC
  Administered 2017-07-08: via INTRAVENOUS

## 2017-07-07 MED ORDER — PANTOPRAZOLE SODIUM 40 MG PO TBEC
40.0000 mg | DELAYED_RELEASE_TABLET | Freq: Every day | ORAL | Status: DC
  Administered 2017-07-09 – 2017-07-10 (×2): 40 mg via ORAL
  Filled 2017-07-07 (×2): qty 1

## 2017-07-07 MED ORDER — TRAZODONE HCL 50 MG PO TABS: 25.0000 mg | ORAL_TABLET | Freq: Every evening | ORAL | Status: DC | PRN

## 2017-07-07 MED ORDER — PHENYTOIN SODIUM EXTENDED 100 MG PO CAPS
100.0000 mg | ORAL_CAPSULE | Freq: Three times a day (TID) | ORAL | Status: DC
  Administered 2017-07-08 – 2017-07-10 (×7): 100 mg via ORAL
  Filled 2017-07-07 (×10): qty 1

## 2017-07-07 MED ORDER — POLYETHYLENE GLYCOL 3350 17 G PO PACK: 17.0000 g | PACK | Freq: Every day | ORAL | Status: DC | PRN

## 2017-07-07 MED ORDER — BISACODYL 5 MG PO TBEC: 5.0000 mg | DELAYED_RELEASE_TABLET | Freq: Every day | ORAL | Status: DC | PRN

## 2017-07-07 MED ORDER — ONDANSETRON HCL 4 MG/2ML IJ SOLN: 4.0000 mg | Freq: Four times a day (QID) | INTRAMUSCULAR | Status: DC | PRN

## 2017-07-07 MED ORDER — ACETAMINOPHEN 325 MG PO TABS: 650.0000 mg | ORAL_TABLET | Freq: Four times a day (QID) | ORAL | Status: DC | PRN

## 2017-07-07 MED ORDER — HYDROCODONE-ACETAMINOPHEN 5-325 MG PO TABS
1.0000 | ORAL_TABLET | ORAL | Status: DC | PRN
  Administered 2017-07-10: 1 via ORAL
  Filled 2017-07-07: qty 1

## 2017-07-07 NOTE — H&P (Signed)
[facility] - [place] at [facility]   PATIENT NAME: Brittany Hogan    MR#:  [id number]  DATE OF BIRTH:  07-18-1935  DATE OF ADMISSION:  07/07/2017  PRIMARY CARE PHYSICIAN: Jaclyn Shaggy, MD   REQUESTING/REFERRING PHYSICIAN:   CHIEF COMPLAINT:  No chief complaint on file.   HISTORY OF PRESENT ILLNESS: Brittany Hogan  is a 82 y.o. female with a known history of seizures, acid reflux, osteoporosis and stroke with residual right lower extremity weakness. Patient is a transfer from [facility] for left hip fracture, status post mechanical fall earlier today. Patient's husband witnessed the fall.  Apparently, she tripped on her right leg which is weak secondary to the stroke.  Patient immediately complained of LT hip pain and was unable to bear weight to her LT leg. LT hip xray done at [facility] ER showed a comminuted intertrochanteric fx. she was accepted by Dr. Mardene Speak, orthopedist at [facility] regional for surgical repair. Patient is ambulatory with a walker at baseline.  She is usually able to walk more than 40 feet without any chest pain or shortness of breath.  She denies any history of heart disease. EKG done at [facility], reviewed by myself, shows normal sinus rhythm with heart rate of 72 bpm.  There are T wave inversions in V1 through V6, noticed on prior EKG, from 2017. Blood test results, done at [facility], including CBC and BMP are unremarkable. Patient is admitted for surgical repair of her left hip fracture.  PAST MEDICAL HISTORY:   Past Medical History:  Diagnosis Date  . Seizures (HCC)   . Stroke [facility])     PAST SURGICAL HISTORY:  Past Surgical History:  Procedure Laterality Date  . APPENDECTOMY    . BREAST BIOPSY Right    neg  . CHOLECYSTECTOMY N/A 09/27/2014   Procedure: LAPAROSCOPIC CHOLECYSTECTOMY with cholangiogram;  Surgeon: Kieth Brightly, MD;  Location: [facility] ORS;  Service: General;  Laterality: N/A;  . SHOULDER SURGERY  Right     SOCIAL HISTORY:  Social History   Tobacco Use  . Smoking status: Never Smoker  . Smokeless tobacco: Never Used  Substance Use Topics  . Alcohol use: No    Alcohol/week: 0.0 oz    FAMILY HISTORY: No family history on file.  DRUG ALLERGIES: No Known Allergies  REVIEW OF SYSTEMS:   CONSTITUTIONAL: No fever/chills.  EYES: No blurred or double vision.  EARS, NOSE, AND THROAT: No tinnitus or ear pain.  RESPIRATORY: No cough, shortness of breath, wheezing or hemoptysis.  CARDIOVASCULAR: No chest pain, orthopnea, edema.  GASTROINTESTINAL: No nausea, vomiting, diarrhea or abdominal pain.  GENITOURINARY: No dysuria, hematuria.  ENDOCRINE: No polyuria, nocturia,  HEMATOLOGY: No bleeding SKIN: No rash or lesion. MUSCULOSKELETAL: Severe left hip pain.   NEUROLOGIC: Chronic lower extremity weakness, status post remote stroke.  PSYCHIATRY: No anxiety or depression.   MEDICATIONS AT HOME:  Prior to Admission medications   Medication Sig Start Date End Date Taking? Authorizing Provider  alendronate (FOSAMAX) 70 MG tablet Take 70 mg by mouth every Tuesday.     [provider]  aspirin EC 325 MG tablet Take 325 mg by mouth daily.    [provider]  hydrochlorothiazide (HYDRODIURIL) 25 MG tablet  10/14/16   [provider]  pantoprazole (PROTONIX) 40 MG tablet  10/21/16   [provider]  PHENobarbital (LUMINAL) 64.8 MG tablet Take 64.8-97.2 tablets by mouth See admin instructions. 64.8 mg every morning and 97.2 every evening  [provider]  phenytoin (DILANTIN) 100 MG ER capsule Take 100 mg by mouth 3 (three) times daily.    [provider]  polyethylene glycol (MIRALAX) packet Take 17 g by mouth daily as needed for mild constipation or moderate constipation. 01/30/15   Myrna Blazer, MD  polyethylene glycol-electrolytes (TRILYTE) 420 G solution Take 4,000 mLs by mouth once. 02/01/15   Emily Filbert, MD       PHYSICAL EXAMINATION:   VITAL SIGNS: Blood pressure 137/71, pulse 79, temperature 98.2 F (36.8 C), temperature source Oral, resp. rate 19, height  (1.575 m), weight 74.8 kg (164 lb 12.8 oz), SpO2 98 %.  GENERAL:  82 y.o.-year-old patient lying in the bed with moderate distress, secondary to left hip pain.  EYES: Pupils equal, round, reactive to light and accommodation. No scleral icterus. Extraocular muscles intact.  HEENT: Head atraumatic, normocephalic. Oropharynx and nasopharynx clear.  NECK:  Supple, no jugular venous distention. No thyroid enlargement, no tenderness.  LUNGS: Normal breath sounds bilaterally, no wheezing, rales,rhonchi or crepitation. No use of accessory muscles of respiration.  CARDIOVASCULAR: S1, S2 normal. No murmurs, rubs, or gallops.  ABDOMEN: Soft, nontender, nondistended. Bowel sounds present. No organomegaly or mass.  EXTREMITIES: No pedal edema, cyanosis, or clubbing.  NEUROLOGIC: Right lower extremity weakness, secondary to remote stroke. MUSCULOSKELETAL: Left lower extremity is shortened and externally rotated. PSYCHIATRIC: The patient is alert and oriented x 3.  SKIN: No obvious rash, lesion, or ulcer.   LABORATORY PANEL:   CBC No results for input(s): WBC, HGB, HCT, PLT, MCV, MCH, MCHC, RDW, LYMPHSABS, MONOABS, EOSABS, BASOSABS, BANDABS in the last 168 hours.  Invalid input(s): NEUTRABS, BANDSABD ------------------------------------------------------------------------------------------------------------------  Chemistries  No results for input(s): NA, K, CL, CO2, GLUCOSE, BUN, CREATININE, CALCIUM, MG, AST, ALT, ALKPHOS, BILITOT in the last 168 hours.  Invalid input(s): GFRCGP ------------------------------------------------------------------------------------------------------------------ CrCl cannot be calculated (Patient's most recent lab result is older than the maximum 21 days  allowed.). ------------------------------------------------------------------------------------------------------------------ No results for input(s): TSH, T4TOTAL, T3FREE, THYROIDAB in the last 72 hours.  Invalid input(s): FREET3   Coagulation profile No results for input(s): INR, PROTIME in the last 168 hours. ------------------------------------------------------------------------------------------------------------------- No results for input(s): DDIMER in the last 72 hours. -------------------------------------------------------------------------------------------------------------------  Cardiac Enzymes No results for input(s): CKMB, TROPONINI, MYOGLOBIN in the last 168 hours.  Invalid input(s): CK ------------------------------------------------------------------------------------------------------------------ Invalid input(s): POCBNP  ---------------------------------------------------------------------------------------------------------------  Urinalysis    Component Value Date/Time   COLORURINE YELLOW (A) 02/01/2015 0825   APPEARANCEUR CLEAR (A) 02/01/2015 0825   APPEARANCEUR Clear 11/21/2013 1101   LABSPEC 1.011 02/01/2015 0825   LABSPEC 1.006 11/21/2013 1101   PHURINE 7.0 02/01/2015 0825   GLUCOSEU NEGATIVE 02/01/2015 0825   GLUCOSEU Negative 11/21/2013 1101   HGBUR NEGATIVE 02/01/2015 0825   BILIRUBINUR NEGATIVE 02/01/2015 0825   BILIRUBINUR Negative 11/21/2013 1101   KETONESUR NEGATIVE 02/01/2015 0825   PROTEINUR NEGATIVE 02/01/2015 0825   NITRITE NEGATIVE 02/01/2015 0825   LEUKOCYTESUR NEGATIVE 02/01/2015 0825   LEUKOCYTESUR 1+ 11/21/2013 1101     RADIOLOGY: No results found.  EKG: Orders placed or performed during the hospital encounter of 02/01/15  . ED EKG  . ED EKG  . EKG 12-Lead  . EKG 12-Lead  . EKG    IMPRESSION AND PLAN:  1.  Left hip fracture, for surgical repair in a.m. Ortho service is consulted.  We will keep patient n.p.o. and  avoid any anticoagulants. Based on patient's clinical review, physical capacity and EKG results, she is medically clear for the  above mentioned surgical procedure.  She is at low risk for postop cardiopulmonary complications. 2.  Seizure disorder, stable, continue home medications. 3.  Right lower extremity paresis, status post old stroke.    All the records are reviewed and case discussed with ED provider. Management plans discussed with the patient, family and they are in agreement.  CODE STATUS: FULL    TOTAL TIME TAKING CARE OF THIS PATIENT: 45 minutes.    Cammy Copa M.D on 07/07/2017 at 11:45 PM  Between 7am to 6pm - Pager - [phone]  After 6pm go to [email or web address] - password [email or web address] [facility]  [place] [facility]  Office  [phone]  CC: Primary care physician; Jaclyn Shaggy, MD

## 2017-07-08 ENCOUNTER — Inpatient Hospital Stay: Payer: Medicare HMO

## 2017-07-08 ENCOUNTER — Inpatient Hospital Stay: Payer: Medicare HMO | Admitting: Anesthesiology

## 2017-07-08 ENCOUNTER — Encounter
Admission: EM | Disposition: A | Discharge status: Skilled Nursing Facility | Payer: Self-pay | Source: Other Acute Inpatient Hospital | Attending: Internal Medicine

## 2017-07-08 ENCOUNTER — Encounter: Payer: Self-pay | Admitting: Anesthesiology

## 2017-07-08 HISTORY — PX: INTRAMEDULLARY (IM) NAIL INTERTROCHANTERIC: SHX5875

## 2017-07-08 LAB — BASIC METABOLIC PANEL
Anion gap: 10 (ref 5–15)
BUN: 10 mg/dL (ref 6–20)
CO2: 25 mmol/L (ref 22–32)
CREATININE: 0.63 mg/dL (ref 0.44–1.00)
Calcium: 7.8 mg/dL — ABNORMAL LOW (ref 8.9–10.3)
Chloride: 94 mmol/L — ABNORMAL LOW (ref 101–111)
GFR calc non Af Amer: >60 mL/min (ref >60)
Glucose, Bld: 110 mg/dL — ABNORMAL HIGH (ref 65–99)
Potassium: 3.2 mmol/L — ABNORMAL LOW (ref 3.5–5.1)
Sodium: 129 mmol/L — ABNORMAL LOW (ref 135–145)

## 2017-07-08 LAB — CBC
HCT: 33.7 % — ABNORMAL LOW (ref 35.0–47.0)
HEMOGLOBIN: 11.8 g/dL — AB (ref 12.0–16.0)
MCH: 32.2 pg (ref 26.0–34.0)
MCHC: 35 g/dL (ref 32.0–36.0)
MCV: 91.9 fL (ref 80.0–100.0)
Platelets: 190 10*3/uL (ref 150–440)
RBC: 3.66 MIL/uL — AB (ref 3.80–5.20)
RDW: 13.1 % (ref 11.5–14.5)
WBC: 8.6 10*3/uL (ref 3.6–11.0)

## 2017-07-08 LAB — PROTIME-INR
INR: 1.14
Prothrombin Time: 14.5 seconds (ref 11.4–15.2)

## 2017-07-08 LAB — PHENOBARBITAL LEVEL: PHENOBARBITAL: 41 ug/mL — AB (ref 15.0–30.0)

## 2017-07-08 LAB — TYPE AND SCREEN
ABO/RH(D): A POS
ANTIBODY SCREEN: NEGATIVE

## 2017-07-08 LAB — GLUCOSE, CAPILLARY: GLUCOSE-CAPILLARY: 129 mg/dL — AB (ref 65–99)

## 2017-07-08 LAB — MRSA PCR SCREENING: MRSA BY PCR: NEGATIVE

## 2017-07-08 LAB — APTT: aPTT: 31 seconds (ref 24–36)

## 2017-07-08 SURGERY — FIXATION, FRACTURE, INTERTROCHANTERIC, WITH INTRAMEDULLARY ROD
Anesthesia: Spinal | Site: Hip | Laterality: Left | Wound class: Clean

## 2017-07-08 MED ORDER — PROPOFOL 500 MG/50ML IV EMUL
INTRAVENOUS | Status: AC
  Filled 2017-07-08: qty 50

## 2017-07-08 MED ORDER — ENOXAPARIN SODIUM 40 MG/0.4ML ~~LOC~~ SOLN
40.0000 mg | SUBCUTANEOUS | Status: DC
  Administered 2017-07-09 – 2017-07-10 (×2): 40 mg via SUBCUTANEOUS
  Filled 2017-07-08 (×2): qty 0.4

## 2017-07-08 MED ORDER — POTASSIUM CHLORIDE IN NACL 20-0.9 MEQ/L-% IV SOLN
INTRAVENOUS | Status: DC
  Administered 2017-07-08 – 2017-07-09 (×2): via INTRAVENOUS
  Filled 2017-07-08 (×6): qty 1000

## 2017-07-08 MED ORDER — ACETAMINOPHEN 10 MG/ML IV SOLN
INTRAVENOUS | Status: DC | PRN
  Administered 2017-07-08: 1000 mg via INTRAVENOUS

## 2017-07-08 MED ORDER — SODIUM CHLORIDE 0.9 % IV SOLN
INTRAVENOUS | Status: DC | PRN
  Administered 2017-07-08: 50 ug/min via INTRAVENOUS

## 2017-07-08 MED ORDER — FENTANYL CITRATE (PF) 100 MCG/2ML IJ SOLN
INTRAMUSCULAR | Status: AC
  Filled 2017-07-08: qty 2

## 2017-07-08 MED ORDER — PHENYLEPHRINE HCL 10 MG/ML IJ SOLN
INTRAMUSCULAR | Status: DC | PRN
  Administered 2017-07-08: 200 ug via INTRAVENOUS
  Administered 2017-07-08 (×2): 100 ug via INTRAVENOUS

## 2017-07-08 MED ORDER — MAGNESIUM CITRATE PO SOLN
1.0000 | Freq: Once | ORAL | Status: AC | PRN
  Administered 2017-07-10: 1 via ORAL
  Filled 2017-07-08 (×2): qty 296

## 2017-07-08 MED ORDER — LACTATED RINGERS IV SOLN
INTRAVENOUS | Status: DC | PRN
  Administered 2017-07-08: 11:00:00 via INTRAVENOUS

## 2017-07-08 MED ORDER — CEFAZOLIN (ANCEF) 1 G IV SOLR: 2.0000 g | INTRAVENOUS | Status: DC

## 2017-07-08 MED ORDER — NEOMYCIN-POLYMYXIN B GU 40-200000 IR SOLN
Status: DC | PRN
  Administered 2017-07-08: 11:00:00

## 2017-07-08 MED ORDER — ALUM & MAG HYDROXIDE-SIMETH 200-200-20 MG/5ML PO SUSP
30.0000 mL | ORAL | Status: DC | PRN
Start: 2017-07-08 — End: 2017-07-10

## 2017-07-08 MED ORDER — FERROUS SULFATE 325 (65 FE) MG PO TABS
325.0000 mg | ORAL_TABLET | Freq: Three times a day (TID) | ORAL | Status: DC
  Administered 2017-07-08 – 2017-07-10 (×7): 325 mg via ORAL
  Filled 2017-07-08 (×7): qty 1

## 2017-07-08 MED ORDER — POTASSIUM CHLORIDE 10 MEQ/100ML IV SOLN
10.0000 meq | INTRAVENOUS | Status: AC
  Administered 2017-07-08: 10 meq via INTRAVENOUS
  Filled 2017-07-08: qty 100

## 2017-07-08 MED ORDER — PHENOBARBITAL 97.2 MG PO TABS
97.2000 mg | ORAL_TABLET | Freq: Every day | ORAL | Status: DC
  Administered 2017-07-08 – 2017-07-09 (×2): 97.2 mg via ORAL
  Filled 2017-07-08 (×2): qty 1

## 2017-07-08 MED ORDER — METHOCARBAMOL 500 MG PO TABS
500.0000 mg | ORAL_TABLET | Freq: Four times a day (QID) | ORAL | Status: DC | PRN
Start: 2017-07-08 — End: 2017-07-10

## 2017-07-08 MED ORDER — PROPOFOL 500 MG/50ML IV EMUL
INTRAVENOUS | Status: DC | PRN
  Administered 2017-07-08: 50 ug/kg/min via INTRAVENOUS
  Administered 2017-07-08: 35 ug/kg/min via INTRAVENOUS

## 2017-07-08 MED ORDER — METHOCARBAMOL 1000 MG/10ML IJ SOLN
500.0000 mg | Freq: Four times a day (QID) | INTRAVENOUS | Status: DC | PRN
  Filled 2017-07-08: qty 5

## 2017-07-08 MED ORDER — ONDANSETRON HCL 4 MG PO TABS: 4.0000 mg | ORAL_TABLET | Freq: Four times a day (QID) | ORAL | Status: DC | PRN

## 2017-07-08 MED ORDER — MIDAZOLAM HCL 2 MG/2ML IJ SOLN
INTRAMUSCULAR | Status: AC
  Filled 2017-07-08: qty 2

## 2017-07-08 MED ORDER — CEFAZOLIN SODIUM-DEXTROSE 1-4 GM/50ML-% IV SOLN
1.0000 g | Freq: Four times a day (QID) | INTRAVENOUS | Status: AC
  Administered 2017-07-08 (×2): 1 g via INTRAVENOUS
  Filled 2017-07-08 (×2): qty 50

## 2017-07-08 MED ORDER — BISACODYL 10 MG RE SUPP
10.0000 mg | Freq: Every day | RECTAL | Status: DC | PRN
  Administered 2017-07-10: 10 mg via RECTAL
  Filled 2017-07-08: qty 1

## 2017-07-08 MED ORDER — DOCUSATE SODIUM 100 MG PO CAPS
100.0000 mg | ORAL_CAPSULE | Freq: Two times a day (BID) | ORAL | Status: DC
  Administered 2017-07-08 – 2017-07-10 (×4): 100 mg via ORAL
  Filled 2017-07-08 (×4): qty 1

## 2017-07-08 MED ORDER — ONDANSETRON HCL 4 MG/2ML IJ SOLN
INTRAMUSCULAR | Status: DC | PRN
  Administered 2017-07-08: 4 mg via INTRAVENOUS

## 2017-07-08 MED ORDER — ONDANSETRON HCL 4 MG/2ML IJ SOLN: 4.0000 mg | Freq: Four times a day (QID) | INTRAMUSCULAR | Status: DC | PRN

## 2017-07-08 MED ORDER — DEXAMETHASONE SODIUM PHOSPHATE 4 MG/ML IJ SOLN
INTRAMUSCULAR | Status: DC | PRN
  Administered 2017-07-08: 5 mg via INTRAVENOUS

## 2017-07-08 MED ORDER — MIDAZOLAM HCL 5 MG/5ML IJ SOLN
INTRAMUSCULAR | Status: DC | PRN
  Administered 2017-07-08: 1 mg via INTRAVENOUS

## 2017-07-08 MED ORDER — PROPOFOL 10 MG/ML IV BOLUS
INTRAVENOUS | Status: DC | PRN
  Administered 2017-07-08: 30 mg via INTRAVENOUS
  Administered 2017-07-08: 40 mg via INTRAVENOUS
  Administered 2017-07-08: 30 mg via INTRAVENOUS

## 2017-07-08 MED ORDER — BUPIVACAINE HCL (PF) 0.5 % IJ SOLN
INTRAMUSCULAR | Status: DC | PRN
  Administered 2017-07-08: 3 mL

## 2017-07-08 MED ORDER — PEG 3350-KCL-NA BICARB-NACL 420 G PO SOLR
4000.0000 mL | Freq: Once | ORAL | Status: DC
  Filled 2017-07-08: qty 4000

## 2017-07-08 MED ORDER — HYDROCHLOROTHIAZIDE 25 MG PO TABS
25.0000 mg | ORAL_TABLET | Freq: Every day | ORAL | Status: DC
  Filled 2017-07-08: qty 1

## 2017-07-08 MED ORDER — MORPHINE SULFATE (PF) 2 MG/ML IV SOLN: 0.5000 mg | INTRAVENOUS | Status: DC | PRN

## 2017-07-08 MED ORDER — ACETAMINOPHEN 10 MG/ML IV SOLN
INTRAVENOUS | Status: AC
  Filled 2017-07-08: qty 100

## 2017-07-08 MED ORDER — PHENOBARBITAL 32.4 MG PO TABS
64.8000 mg | ORAL_TABLET | Freq: Every day | ORAL | Status: DC
Start: 2017-07-09 — End: 2017-07-10
  Administered 2017-07-09 – 2017-07-10 (×2): 64.8 mg via ORAL
  Filled 2017-07-08 (×2): qty 2

## 2017-07-08 MED ORDER — CEFAZOLIN SODIUM-DEXTROSE 1-4 GM/50ML-% IV SOLN
1.0000 g | INTRAVENOUS | Status: AC
  Administered 2017-07-08: 2 g via INTRAVENOUS
  Filled 2017-07-08: qty 50

## 2017-07-08 MED ORDER — TRAMADOL HCL 50 MG PO TABS
50.0000 mg | ORAL_TABLET | Freq: Four times a day (QID) | ORAL | Status: DC
  Administered 2017-07-08 – 2017-07-10 (×7): 50 mg via ORAL
  Filled 2017-07-08 (×7): qty 1

## 2017-07-08 MED ORDER — ONDANSETRON HCL 4 MG/2ML IJ SOLN
INTRAMUSCULAR | Status: AC
  Filled 2017-07-08: qty 2

## 2017-07-08 MED ORDER — ACETAMINOPHEN 500 MG PO TABS
500.0000 mg | ORAL_TABLET | Freq: Four times a day (QID) | ORAL | Status: AC
  Administered 2017-07-08 – 2017-07-09 (×3): 500 mg via ORAL
  Filled 2017-07-08 (×3): qty 1

## 2017-07-08 SURGICAL SUPPLY — 43 items
BIT DRILL 4.3MMS DISTAL GRDTED (BIT) ×1 IMPLANT
BNDG COHESIVE 6X5 TAN STRL LF (GAUZE/BANDAGES/DRESSINGS) ×6 IMPLANT
CANISTER SUCT 1200ML W/VALVE (MISCELLANEOUS) ×6 IMPLANT
CORTICAL BONE SCR 5.0MM X 48MM (Screw) ×3 IMPLANT
DRAPE SHEET LG 3/4 BI-LAMINATE (DRAPES) ×9 IMPLANT
DRAPE SURG 17X11 SM STRL (DRAPES) ×6 IMPLANT
DRAPE U-SHAPE 47X51 STRL (DRAPES) ×3 IMPLANT
DRESSING ALLEVYN LIFE SACRUM (GAUZE/BANDAGES/DRESSINGS) ×3 IMPLANT
DRILL 4.3MMS DISTAL GRADUATED (BIT) ×3
DRSG OPSITE POSTOP 3X4 (GAUZE/BANDAGES/DRESSINGS) ×6 IMPLANT
DRSG OPSITE POSTOP 4X14 (GAUZE/BANDAGES/DRESSINGS) IMPLANT
DRSG OPSITE POSTOP 4X6 (GAUZE/BANDAGES/DRESSINGS) ×3 IMPLANT
DURAPREP 26ML APPLICATOR (WOUND CARE) ×3 IMPLANT
ELECT REM PT RETURN 9FT ADLT (ELECTROSURGICAL) ×3
ELECTRODE REM PT RTRN 9FT ADLT (ELECTROSURGICAL) ×1 IMPLANT
GAUZE XEROFORM 4X4 STRL (GAUZE/BANDAGES/DRESSINGS) ×3 IMPLANT
GLOVE BIOGEL PI IND STRL 7.0 (GLOVE) ×6 IMPLANT
GLOVE BIOGEL PI IND STRL 9 (GLOVE) ×1 IMPLANT
GLOVE BIOGEL PI INDICATOR 7.0 (GLOVE) ×12
GLOVE BIOGEL PI INDICATOR 9 (GLOVE) ×2
GLOVE SURG 9.0 ORTHO LTXF (GLOVE) ×9 IMPLANT
GOWN STRL REUS TWL 2XL XL LVL4 (GOWN DISPOSABLE) ×3 IMPLANT
GOWN STRL REUS W/ TWL LRG LVL3 (GOWN DISPOSABLE) ×2 IMPLANT
GOWN STRL REUS W/TWL LRG LVL3 (GOWN DISPOSABLE) ×4
GUIDEPIN VERSANAIL DSP 3.2X444 (ORTHOPEDIC DISPOSABLE SUPPLIES) ×3 IMPLANT
GUIDEWIRE BALL NOSE 100CM (WIRE) ×3 IMPLANT
HEMOVAC 400CC 10FR (MISCELLANEOUS) ×3 IMPLANT
HFN LH 130 DEG 9MM X 360MM (Nail) ×3 IMPLANT
KIT TURNOVER CYSTO (KITS) ×3 IMPLANT
MAT BLUE FLOOR 46X72 FLO (MISCELLANEOUS) ×3 IMPLANT
NS IRRIG 1000ML POUR BTL (IV SOLUTION) ×6 IMPLANT
PACK HIP COMPR (MISCELLANEOUS) ×3 IMPLANT
PAD ABD DERMACEA PRESS 5X9 (GAUZE/BANDAGES/DRESSINGS) ×3 IMPLANT
SCREW CORTICL BON 5.0MM X 48MM (Screw) ×1 IMPLANT
SCREW LAG 10.5MMX105MM HFN (Screw) ×3 IMPLANT
STAPLER SKIN PROX 35W (STAPLE) ×3 IMPLANT
SUCTION FRAZIER HANDLE 10FR (MISCELLANEOUS) ×2
SUCTION TUBE FRAZIER 10FR DISP (MISCELLANEOUS) ×1 IMPLANT
SUT VIC AB 0 CT1 36 (SUTURE) ×3 IMPLANT
SUT VIC AB 2-0 CT1 27 (SUTURE) ×2
SUT VIC AB 2-0 CT1 TAPERPNT 27 (SUTURE) ×1 IMPLANT
SUT VICRYL 0 AB UR-6 (SUTURE) ×3 IMPLANT
SYR 30ML LL (SYRINGE) ×3 IMPLANT

## 2017-07-08 NOTE — NC FL2 (Signed)
[place] MEDICAID FL2 LEVEL OF CARE SCREENING TOOL     IDENTIFICATION  Patient Name: Brittany Hogan Birthdate: [birth year] Sex: female Admission Date (Current Location): 07/07/2017  [place] and [place] Number:  Chiropodist and Address:  [facility], [address], [place], [place] [address]      Provider Number: [id number]  Attending Physician Name and Address:  Enedina Finner, MD  Relative Name and Phone Number:       Current Level of Care: Hospital Recommended Level of Care: Skilled Nursing Facility Prior Approval Number:    Date Approved/Denied:   PASRR Number: [id number])  Discharge Plan: SNF    Current Diagnoses: Patient Active Problem List   Diagnosis Date Noted  . Hip fracture, unspecified laterality, closed, initial encounter (HCC) 07/07/2017  . Hip fracture (HCC) 07/07/2017  . Lower abdominal pain 10/29/2016  . Vaginal lesion 10/29/2016    Orientation RESPIRATION BLADDER Height & Weight     Self, Time, Situation, Place  Normal Continent Weight: 164 lb (74.4 kg) Height:   (157.5 cm)  BEHAVIORAL SYMPTOMS/MOOD NEUROLOGICAL BOWEL NUTRITION STATUS    Convulsions/Seizures(history of seizures) Continent Diet: NPO for surgery to be advanced.   AMBULATORY STATUS COMMUNICATION OF NEEDS Skin   Extensive Assist Verbally Surgical wounds                       Personal Care Assistance Level of Assistance  Bathing, Feeding, Dressing Bathing Assistance: Limited assistance Feeding assistance: Independent Dressing Assistance: Limited assistance     Functional Limitations Info  Sight, Hearing, Speech Sight Info: Adequate Hearing Info: Adequate Speech Info: Adequate    SPECIAL CARE FACTORS FREQUENCY  PT (By licensed PT), OT (By licensed OT)     PT Frequency: (5) OT Frequency: (5)            Contractures      Additional Factors Info  Code Status, Allergies Code Status Info: (Full Code. ) Allergies Info: (No  Known Allergies. )           Current Medications (07/08/2017):  This is the current hospital active medication list Current Facility-Administered Medications  Medication Dose Route Frequency Provider Last Rate Last Dose  . 0.9 %  sodium chloride infusion   Intravenous Continuous Cammy Copa, MD 75 mL/hr at 07/08/17 0001    . acetaminophen (TYLENOL) tablet 650 mg  650 mg Oral Q6H PRN Cammy Copa, MD       Or  . acetaminophen (TYLENOL) suppository 650 mg  650 mg Rectal Q6H PRN Cammy Copa, MD      . bisacodyl (DULCOLAX) EC tablet 5 mg  5 mg Oral Daily PRN Cammy Copa, MD      . ceFAZolin (ANCEF) IVPB 1 g/50 mL premix  1 g Intravenous 30 min Pre-Op Juanell Fairly, MD      . docusate sodium (COLACE) capsule 100 mg  100 mg Oral BID Cammy Copa, MD      . HYDROcodone-acetaminophen (NORCO/VICODIN) 5-325 MG per tablet 1-2 tablet  1-2 tablet Oral Q4H PRN Cammy Copa, MD      . ondansetron [facility]) tablet 4 mg  4 mg Oral Q6H PRN Cammy Copa, MD       Or  . ondansetron [facility]) injection 4 mg  4 mg Intravenous Q6H PRN Cammy Copa, MD      . pantoprazole (PROTONIX) EC tablet 40 mg  40 mg Oral Daily Cammy Copa, MD      . phenytoin (  DILANTIN) ER capsule 100 mg  100 mg Oral TID Cammy Copa, MD      . polyethylene glycol (MIRALAX / GLYCOLAX) packet 17 g  17 g Oral Daily PRN Cammy Copa, MD      . traZODone (DESYREL) tablet 25 mg  25 mg Oral QHS PRN Cammy Copa, MD         Discharge Medications: Please see discharge summary for a list of discharge medications.  Relevant Imaging Results:  Relevant Lab Results:   Additional Information (SSN: [id number])  Tamaka Sawin, Darleen Crocker, LCSW

## 2017-07-08 NOTE — Transfer of Care (Signed)
Immediate Anesthesia Transfer of Care Note  Patient: Brittany Hogan  Procedure(s) Performed: INTRAMEDULLARY (IM) NAIL INTERTROCHANTRIC (Left Hip)  Patient Location: PACU  Anesthesia Type:Spinal  Level of Consciousness: awake  Airway & Oxygen Therapy: Patient Spontanous Breathing  Post-op Assessment: Post -op Vital signs reviewed and stable  Post vital signs: stable  Last Vitals:  Vitals Value Taken Time  BP 111/79 07/08/2017 12:35 PM  Temp    Pulse 69 07/08/2017 12:35 PM  Resp 13 07/08/2017 12:35 PM  SpO2 96 % 07/08/2017 12:35 PM  Vitals shown include unvalidated device data.  Last Pain:  Vitals:   07/08/17 0956  TempSrc: Temporal  PainSc: 0-No pain         Complications: No apparent anesthesia complications

## 2017-07-08 NOTE — Op Note (Signed)
07/08/2017  1:00 PM  PATIENT:  Brittany Hogan    PRE-OPERATIVE DIAGNOSIS:  LEFT INTERTROCHANTERIC HIP FRACTURE  POST-OPERATIVE DIAGNOSIS:  Same  PROCEDURE:  INTRAMEDULLARY FIXATION FOR LEFT  INTERTROCHANTRIC HIP FRACTURE  SURGEON:  Thornton Park, MD  ANESTHESIA:   General  PREOPERATIVE INDICATIONS:  Brittany Hogan is a  82 y.o. female with a diagnosis of LEFT INTERTOCHANTERIC HIP FRACTURE who failed conservative measures and elected for surgical management.    The risks, benefits and alternatives were discussed with the patient and their family.  The risks include but are not limited to infection, bleeding, nerve or blood vessel injury, malunion, nonunion, hardware prominence, hardware failure, change in leg lengths or lower extremity rotation need for further surgery including hardware removal with conversion to a total hip arthroplasty. Medical risks include but are not limited to DVT and pulmonary embolism, myocardial infarction, stroke, pneumonia, respiratory failure and death. The patient and their family understood these risks and wished to proceed with surgery.  IMPLANTS:  Biomet Affixus nail 360 x 67m, lag screw 105 mm and distal interlocking screw 456m  OPERATIVE PROCEDURE:  The patient was met in the preop area with her family at the bedside.  She was admitted overnight by the hospitalist service.  The on-call orthopedic surgeon consulted on the patient overnight.  I personally interviewed and examined the patient.  I marked the left hip according to the hospitals correct site of surgery protocol.  Splane the details of the operation as well as the postoperative course.  I also discussed the potential complications from the surgery with the patient and her family.  They were in agreement with the plan for surgery.    Patient was brought to the operating room and placed in the supine position on the fracture table after spinal anesthesia was administered.  A closed reduction was  performed under C-arm guidance.  The fracture reduction was confirmed on both AP and lateral views. A time out was performed to verify the patient's name, date of birth, medical record number, correct site of surgery correct procedure to be performed. The timeout was also used to verify the patient received antibiotics and all appropriate instruments, implants and radiographic studies were available in the room. Once all in attendance were in agreement, the case began. The patient was prepped and draped in a sterile fashion. The patient received preoperative antibiotics with 2 grams of ancef.  An incision was made proximal to the greater trochanter in line with the femur. A guidewire was placed over the tip of the greater trochanter and advanced into the proximal femur to the level of the lesser trochanter.  Confirmation of the drill pin position was made on AP and lateral C-arm images.  The threaded guidepin was then overdrilled with the proximal femoral drill.  A ball-tipped guidewire was then advanced across the fracture and down the femoral shaft to the knee. Its position at both the hip and the knee was confirmed on C-arm imaging. A depth gauge was used to measure the length of the long nail to be used. It was measured to be 360 mm. The nail was then inserted into the proximal femur, across the fracture site and down the femoral shaft. Its position was confirmed on AP and lateral C-arm images.   Once the nail was completely seated, the drill guide for the lag screw was placed through the guide arm for the Affixus nail. A guidepin was then placed through this drill guide and advanced through  the lateral cortex of the femur, across the fracture site and into the femoral head achieving a tip apex distance of less than 25 mm. The length of the drill pin was measured to 119m, and then the drill for the lag screw was advanced through the lateral cortex, across the fracture site and up into the femoral head to  the depth of the lag screw..  The lag screw was then advanced by hand into position across the fracture site into the femoral head. Its final position was confirmed on AP and lateral C-arm images. Compression was applied as traction was carefully released. The set screw in the top of the intramedullary rod was tightened by hand using a screwdriver. It was backed off a quarter turn to allow for compression at the fracture site.  The attention was then turned to placement of the distal interlocking screw. A perfect circle technique was used. A single small stab incisions were made over the distal interlocking screw holes. A free hand technique was used to drill both distal interlocking screws. The depth of the screw was measured with a depth gauge. The 429mscrew was then advanced into position and tightened by hand. Final C-arm images of the entire intramedullary construct were taken in both the AP and lateral planes.   The wounds were irrigated copiously and closed with 0 Vicryl for closure of the deep fascia and 2-0 Vicryl for subcutaneous closure. The skin was approximated with staples. A dry sterile dressing was applied. I was scrubbed and present the entire case and all sharp and instrument counts were correct at the conclusion of the case. Patient was transferred to hospital bed and brought to PACU in stable condition. I spoke with the patient's family in the post-op consultation room to let them know the case was performed without complication and the patient was stable in the recovery room.    KeTimoteo GaulMD

## 2017-07-08 NOTE — Progress Notes (Signed)
Clinical Child psychotherapist (CSW) presented bed offers to patient's husband [name] and he chose [facility]. [facility] liaison is aware of above.   [employer], LCSW [phone]

## 2017-07-08 NOTE — Consult Note (Signed)
Reason for Consult: Left hip fracture    Brittany Hogan is an 82 y.o. female.  HPI: Brittany Hogan is a transfer from [facility] for a left intertrochanteric hip fracture. She suffered a witnessed mechanical fall. Pt has underlying RLE weakness secondary to a stroke remotely.  Pt c/o left hip pain and unable to bear weight. She denies having any pain elsewhere or other associated injury. No LOC  Past Medical History:  Diagnosis Date  . Seizures (HCC)   . Stroke [facility])     Past Surgical History:  Procedure Laterality Date  . APPENDECTOMY    . BREAST BIOPSY Right    neg  . CHOLECYSTECTOMY N/A 09/27/2014   Procedure: LAPAROSCOPIC CHOLECYSTECTOMY with cholangiogram;  Surgeon: Kieth Brightly, MD;  Location: [facility] ORS;  Service: General;  Laterality: N/A;  . SHOULDER SURGERY Right     No family history on file.  Social History:  reports that she has never smoked. She has never used smokeless tobacco. She reports that she does not drink alcohol or use drugs.  Allergies: No Known Allergies  Medications: I have reviewed the patient's current medications.  Results for orders placed or performed during the hospital encounter of 07/07/17 (from the past 48 hour(s))  MRSA PCR Screening     Status: None   Collection Time: 07/07/17 11:35 PM  Result Value Ref Range   MRSA by PCR NEGATIVE NEGATIVE    Comment:        The GeneXpert MRSA Assay (FDA approved for NASAL specimens only), is one component of a comprehensive MRSA colonization surveillance program. It is not intended to diagnose MRSA infection nor to guide or monitor treatment for MRSA infections. Performed at [facility], [address] [place], [place] [address]   CBC     Status: Abnormal   Collection Time: 07/08/17  4:50 AM  Result Value Ref Range   WBC 8.6 3.6 - 11.0 K/uL   RBC 3.66 (L) 3.80 - 5.20 MIL/uL   Hemoglobin 11.8 (L) 12.0 - 16.0 g/dL   HCT 19.1 (L) 47.8 - 29.5 %   MCV 91.9 80.0 - 100.0 fL   MCH 32.2 26.0 - 34.0 pg   MCHC 35.0 32.0 - 36.0 g/dL   RDW 62.1 30.8 - 65.7 %   Platelets 190 150 - 440 K/uL    Comment: Performed at [facility], [address], [place], [place] [address]    No results found.  ROS  Left hip pain. Ambulatory dysfunction.  All remaining ROS tested and were negative  Blood pressure 137/71, pulse 79, temperature 98.2 F (36.8 C), temperature source Oral, resp. rate 19, height  (1.575 m), weight 74.8 kg (164 lb 12.8 oz), SpO2 98 %.   Physical Exam  Left lower extremity shortened and externally rotated. TTP left hip laterally over the trochanter. No open wounds. Compartments soft L hip ROM deferred due to known injury L knee nontender. No effusion.  5/5 PF/DF/EHL LLE Sensation intact distally L4-S1. + DP/PT pulses distally All remaining bony prominences of bilateral lower and upper extremity palpated without tenderness or deformity  Assessment/Plan: Left Intertrochanteric Hip Fracture  -New xrays ordered L femur for preop planning. New labs.  -bedrest/pain control -NPO. On call to OR today for IM rod left hip/femur with Dr. Martha Clan -pt medically cleared per hospitalist. Pt is not on anticoagulants.   Carnella Guadalajara 07/08/2017, 5:54 AM

## 2017-07-08 NOTE — Anesthesia Post-op Follow-up Note (Signed)
Anesthesia QCDR form completed.        

## 2017-07-08 NOTE — Anesthesia Preprocedure Evaluation (Signed)
Anesthesia Evaluation  Patient identified by MRN, date of birth, ID band Patient awake    Reviewed: Allergy & Precautions, H&P , NPO status , Patient's Chart, lab work & pertinent test results, reviewed documented beta blocker date and time   History of Anesthesia Complications Negative for: history of anesthetic complications  Airway Mallampati: II  TM Distance: >3 FB Neck ROM: full    Dental no notable dental hx. (+) Poor Dentition   Pulmonary neg pulmonary ROS,    Pulmonary exam normal breath sounds clear to auscultation       Cardiovascular Exercise Tolerance: Good negative cardio ROS Normal cardiovascular exam Rhythm:regular Rate:Normal     Neuro/Psych Seizures -, Well Controlled,  CVA (residual R-sided weakness (LE > UE)), Residual Symptoms negative psych ROS   GI/Hepatic Neg liver ROS, GERD  ,  Endo/Other  negative endocrine ROS  Renal/GU negative Renal ROS  negative genitourinary   Musculoskeletal   Abdominal   Peds  Hematology negative hematology ROS (+)   Anesthesia Other Findings Past Medical History:   Stroke                                                       Seizures                                                     Reproductive/Obstetrics negative OB ROS                             Anesthesia Physical  Anesthesia Plan  ASA: III  Anesthesia Plan: Spinal   Post-op Pain Management:    Induction:   PONV Risk Score and Plan:   Airway Management Planned: Nasal Cannula  Additional Equipment:   Intra-op Plan:   Post-operative Plan:   Informed Consent: I have reviewed the patients History and Physical, chart, labs and discussed the procedure including the risks, benefits and alternatives for the proposed anesthesia with the patient or authorized representative who has indicated his/her understanding and acceptance.   Dental Advisory Given  Plan Discussed  with: Anesthesiologist, CRNA and Surgeon  Anesthesia Plan Comments:         Anesthesia Quick Evaluation

## 2017-07-08 NOTE — Progress Notes (Signed)
[facility] [place] at [facility]   PATIENT NAME: Brittany Hogan    MR#:  [id number]  DATE OF BIRTH:  Aug 28, 1935  SUBJECTIVE:  patient came in after weakness mechanical fall at home. She is a chronically weak right leg due to previous stroke. Patient did not have her cane/walker with her and had a mechanical fall. No loss of consciousness. She was seen in the PACU. She is wide awake alert feeling hungry  REVIEW OF SYSTEMS:   Review of Systems  Constitutional: Negative for chills, fever and weight loss.  HENT: Negative for ear discharge, ear pain and nosebleeds.   Eyes: Negative for blurred vision, pain and discharge.  Respiratory: Negative for sputum production, shortness of breath, wheezing and stridor.   Cardiovascular: Negative for chest pain, palpitations, orthopnea and PND.  Gastrointestinal: Negative for abdominal pain, diarrhea, nausea and vomiting.  Genitourinary: Negative for frequency and urgency.  Musculoskeletal: Positive for joint pain. Negative for back pain.  Neurological: Negative for sensory change, speech change, focal weakness and weakness.  Psychiatric/Behavioral: Negative for depression and hallucinations. The patient is not nervous/anxious.      DRUG ALLERGIES:  No Known Allergies  VITALS:  Blood pressure 120/75, pulse 69, temperature 99.3 F (37.4 C), temperature source Axillary, resp. rate 16, height  (1.575 m), weight 74.4 kg (164 lb), SpO2 92 %.  PHYSICAL EXAMINATION:   Physical Exam  GENERAL:  82 y.o.-year-old patient lying in the bed with no acute distress.  EYES: Pupils equal, round, reactive to light and accommodation. No scleral icterus. Extraocular muscles intact.  HEENT: Head atraumatic, normocephalic. Oropharynx and nasopharynx clear.  NECK:  Supple, no jugular venous distention. No thyroid enlargement, no tenderness.  LUNGS: Normal breath sounds bilaterally, no wheezing, rales, rhonchi. No use of accessory  muscles of respiration.  CARDIOVASCULAR: S1, S2 normal. No murmurs, rubs, or gallops.  ABDOMEN: Soft, nontender, nondistended. Bowel sounds present. No organomegaly or mass.  EXTREMITIES: No cyanosis, clubbing or edema b/l.   Left hip surgical nursing present NEUROLOGIC: Cranial nerves II through XII are intact. No focal Motor or sensory deficits b/l.   PSYCHIATRIC:  patient is alert and oriented x 3.  SKIN: No obvious rash, lesion, or ulcer.   LABORATORY PANEL:  CBC Recent Labs  Lab 07/08/17 0450  WBC 8.6  HGB 11.8*  HCT 33.7*  PLT 190    Chemistries  Recent Labs  Lab 07/08/17 0450  NA 129*  K 3.2*  CL 94*  CO2 25  GLUCOSE 110*  BUN 10  CREATININE 0.63  CALCIUM 7.8*   Cardiac Enzymes No results for input(s): TROPONINI in the last 168 hours. RADIOLOGY:  Dg Pelvis 1-2 Views  Result Date: 07/08/2017 CLINICAL DATA:  Known left hip fracture following mechanical fall yesterday. History of previous seizure activity and chronic right lower extremity weakness. EXAM: PELVIS - 1-2 VIEW COMPARISON:  Limited views of the pelvis from an abdominal radiograph of Jul 12, 2015 FINDINGS: There is an acute comminuted mildly distracted fracture of the intertrochanteric region of the left hip. The bones are subjectively osteopenic. The iliac bones and pubic bones appear intact. The right hip is grossly normal. IMPRESSION: Acute intertrochanteric fracture of the left hip. Electronically Signed   By: David  [place] M.D.   On: 07/08/2017 07:24   Dg Hip Operative Unilat W Or W/o Pelvis Left  Result Date: 07/08/2017 CLINICAL DATA:  Status post ORIF for acute left hip fracture. EXAM: OPERATIVE left HIP (WITH PELVIS IF PERFORMED)  6 VIEWS TECHNIQUE: Fluoroscopic spot image(s) were submitted for interpretation post-operatively. Fluoro time reported is 1 minutes, 7 seconds COMPARISON:  Preoperative study of Jul 07, 2017 FINDINGS: The patient has undergone intramedullary rod and telescoping screw placement  for fixation of a fracture of the intertrochanteric region of the left hip. Radiographic positioning of the appliances is good. The fracture fragments are now in near anatomic in alignment. IMPRESSION: ORIF of an intertrochanteric fracture of the left hip. Electronically Signed   By: David  [place] M.D.   On: 07/08/2017 12:22   Dg Femur Min 2 Views Left  Result Date: 07/08/2017 CLINICAL DATA:  Postop left femur. EXAM: LEFT FEMUR 2 VIEWS COMPARISON:  07/08/2017. FINDINGS: ORIF left femur. Hardware intact. Anatomic alignment. Postsurgical changes noted about the soft tissues about the left hip. Surgical staples noted over the left hip. IMPRESSION: ORIF left femur.  Hardware intact.  Anatomic alignment. Electronically Signed   By: Maisie Fus  Register   On: 07/08/2017 13:49   Dg Femur Min 2 Views Left  Result Date: 07/08/2017 CLINICAL DATA:  Status post fall.  Known left hip fracture. EXAM: LEFT FEMUR 2 VIEWS COMPARISON:  AP view of the pelvis of today's date FINDINGS: Again demonstrated is the comminuted and distracted intertrochanteric fracture of the left hip. There is mild superior migration of the femoral shaft with respect to the base of the femoral neck. The femoral shaft is intact. The observed portions of the left knee are within the limits of normal for age. IMPRESSION: Acute comminuted distracted intertrochanteric fracture of the left hip. Electronically Signed   By: David  [place] M.D.   On: 07/08/2017 07:26   ASSESSMENT AND PLAN:  Brittany Hogan  is a 82 y.o. female with a known history of seizures, acid reflux, osteoporosis and stroke with residual right lower extremity weakness. Patient is a transfer from [facility] for left hip fracture, status post mechanical fall yday.  1.  Left hip fracture-status post mechanical fall at home weakness by husband -it is status post left hip heavy arthroplasty by Dr. Martha Clan. Input appreciated. -Physical therapy. = Resume diet  2.  Seizure disorder,  stable, continue home medications. -Patient takes phenobarbital. No evidence of seizure before or after the fall  3.  Right lower extremity paresis, status post old stroke.   4.Hyponatremia/ hypokalemia -IVF and replete K   Case discussed with Care Management/Social Worker. Management plans discussed with the patient, family and they are in agreement.  CODE STATUS: Full  DVT Prophylaxis: lovenox  TOTAL TIME TAKING CARE OF THIS PATIENT: *30* minutes.  >50% time spent on counselling and coordination of care  POSSIBLE D/C IN 1-2 DAYS, DEPENDING ON CLINICAL CONDITION.  Note: This dictation was prepared with Dragon dictation along with smaller phrase technology. Any transcriptional errors that result from this process are unintentional.  Enedina Finner M.D on 07/08/2017 at 3:16 PM  Between 7am to 6pm - Pager - [phone]  After 6pm go to [email or web address] - password [email or web address]  [facility]  Office  [phone]  CC: Primary care physician; Jaclyn Shaggy, MDPatient ID: Cherrie Gauze, female   DOB: [birth year], 82 y.o.   MRN: [id number]

## 2017-07-08 NOTE — Clinical Social Work Placement (Signed)
   CLINICAL SOCIAL WORK PLACEMENT  NOTE  Date:  07/08/2017  Patient Details  Name: Brittany Hogan MRN: [id number] Date of Birth: Feb 20, 1935  Clinical Social Work is seeking post-discharge placement for this patient at the Skilled  Nursing Facility level of care (*CSW will initial, date and re-position this form in  chart as items are completed):  Yes   Patient/family provided with [facility] Clinical Social Work Department's list of facilities offering this level of care within the geographic area requested by the patient (or if unable, by the patient's family).  Yes   Patient/family informed of their freedom to choose among providers that offer the needed level of care, that participate in Medicare, Medicaid or managed care program needed by the patient, have an available bed and are willing to accept the patient.  Yes   Patient/family informed of [facility]'s ownership interest in [facility] and [facility], as well as of the fact that they are under no obligation to receive care at these facilities.  PASRR submitted to EDS on       PASRR number received on       Existing PASRR number confirmed on 07/08/17     FL2 transmitted to all facilities in geographic area requested by pt/family on 07/08/17     FL2 transmitted to all facilities within larger geographic area on       Patient informed that his/her managed care company has contracts with or will negotiate with certain facilities, including the following:            Patient/family informed of bed offers received.  Patient chooses bed at       Physician recommends and patient chooses bed at      Patient to be transferred to   on  .  Patient to be transferred to facility by       Patient family notified on   of transfer.  Name of family member notified:        PHYSICIAN       Additional Comment:    _______________________________________________ Conception Doebler, Darleen Crocker, LCSW 07/08/2017, 9:41 AM

## 2017-07-08 NOTE — Anesthesia Procedure Notes (Signed)
Spinal  Patient location during procedure: OR Start time: 07/08/2017 10:27 AM End time: 07/08/2017 10:40 AM Staffing Anesthesiologist: Yves Dill, MD Resident/CRNA: Irving Burton, CRNA Performed: anesthesiologist  Preanesthetic Checklist Completed: patient identified, site marked, surgical consent, pre-op evaluation, IV checked, risks and benefits discussed and monitors and equipment checked Spinal Block Patient position: right lateral decubitus Prep: ChloraPrep Patient monitoring: heart rate, continuous pulse ox and blood pressure Approach: right paramedian Location: L3-4 Injection technique: single-shot Needle Needle type: Spinocan  Needle gauge: 25 G

## 2017-07-08 NOTE — Clinical Social Work Note (Signed)
Clinical Social Work Assessment  Patient Details  Name: DESTANI WAMSER MRN: [id number] Date of Birth: August 09, 1935  Date of referral:  07/08/17               Reason for consult:  Facility Placement                Permission sought to share information with:  Chartered certified accountant granted to share information::  Yes, Verbal Permission Granted  Name::      [place]::   [place]   Relationship::     Contact Information:     Housing/Transportation Living arrangements for the past 2 months:  [place] of Information:  Patient, Adult Children, Spouse Patient Interpreter Needed:  None Criminal Activity/Legal Involvement Pertinent to Current Situation/Hospitalization:  No - Comment as needed Significant Relationships:  Adult Children, Spouse, Other Family Members Lives with:  Spouse Do you feel safe going back to the place where you live?  Yes Need for family participation in patient care:  Yes (Comment)  Care giving concerns:  Patient lives in [place] with her husband Dow Adolph Phegley.    Social Worker assessment / plan:  Holiday representative ([place]) reviewed chart and noted that patient has a hip fracture. Surgery and PT are pending. CSW met with patient and her husband Grayce Sessions and daughter in law [place] were at bedside. Patient was alert and oriented X4 and was laying in the bed. CSW introduced self and explained role of CSW department. Patient reported that she lives in [place] with her husband and has 2 adult sons. Patient has an advanced directive on file under the media tab in Epic. Patient's husband is HPOA and her 2 sons are alternate HPOAs. CSW explained that PT will evaluate patient after surgery and make a recommendation of home health or SNF. Patient and her husband prefer SNF. Per patient she went to [facility] 7 years ago. CSW explained that [employer] will have to approve SNF. Patient and husband verbalized their  understanding. FL2 complete and faxed out. CSW will continue to follow and assist as needed.   Employment status:  Retired Nurse, adult PT Recommendations:  Not assessed at this time Information / Referral to community resources:  [place]  Patient/Family's Response to care:  Patient and her husband are agreeable to SNF search in [place].   Patient/Family's Understanding of and Emotional Response to Diagnosis, Current Treatment, and Prognosis:  Patient and her husband were very pleasant and thanked CSW for assistance.   Emotional Assessment Appearance:  Appears stated age Attitude/Demeanor/Rapport:    Affect (typically observed):  Accepting, Adaptable, Pleasant Orientation:  Oriented to Self, Oriented to Place, Oriented to  Time, Oriented to Situation Alcohol / Substance use:  Not Applicable Psych involvement (Current and /or in the community):  No (Comment)  Discharge Needs  Concerns to be addressed:  Discharge Planning Concerns Readmission within the last 30 days:  No Current discharge risk:  Dependent with Mobility Barriers to Discharge:  Continued Medical Work up   [employer], Veronia Beets, LCSW 07/08/2017, 9:42 AM

## 2017-07-08 NOTE — Progress Notes (Addendum)
Subjective:  Patient seen in the pre-op area with her family.  She sustained a mechanical fall yesterday and was transferred here from [facility].  Patient reports left pain as mild to moderate.  She denies numbness or tingling in her left lower extremity.  She denies other injuries.  Objective:   VITALS:   Vitals:   07/07/17 2338 07/08/17 0619 07/08/17 0832 07/08/17 0956  BP:   127/69 137/66  Pulse:   72 74  Resp:   18 18  Temp:   98.1 F (36.7 C) 98.8 F (37.1 C)  TempSrc:   Oral Temporal  SpO2:   98% 96%  Weight: 74.8 kg (164 lb 12.8 oz) 74.4 kg (164 lb)  74.4 kg (164 lb)  Height:  (1.575 m)    (1.575 m)    PHYSICAL EXAM: Left lower extremity: Neurovascular intact Sensation intact distally Intact pulses distally Dorsiflexion/Plantar flexion intact No cellulitis present Compartment soft  LABS  Results for orders placed or performed during the hospital encounter of 07/07/17 (from the past 24 hour(s))  MRSA PCR Screening     Status: None   Collection Time: 07/07/17 11:35 PM  Result Value Ref Range   MRSA by PCR NEGATIVE NEGATIVE  Basic metabolic panel     Status: Abnormal   Collection Time: 07/08/17  4:50 AM  Result Value Ref Range   Sodium 129 (L) 135 - 145 mmol/L   Potassium 3.2 (L) 3.5 - 5.1 mmol/L   Chloride 94 (L) 101 - 111 mmol/L   CO2 25 22 - 32 mmol/L   Glucose, Bld 110 (H) 65 - 99 mg/dL   BUN 10 6 - 20 mg/dL   Creatinine, Ser 4.09 0.44 - 1.00 mg/dL   Calcium 7.8 (L) 8.9 - 10.3 mg/dL   GFR calc non Af Amer >60 >60 mL/min   GFR calc Af Amer >60 >60 mL/min   Anion gap 10 5 - 15  CBC     Status: Abnormal   Collection Time: 07/08/17  4:50 AM  Result Value Ref Range   WBC 8.6 3.6 - 11.0 K/uL   RBC 3.66 (L) 3.80 - 5.20 MIL/uL   Hemoglobin 11.8 (L) 12.0 - 16.0 g/dL   HCT 81.1 (L) 91.4 - 78.2 %   MCV 91.9 80.0 - 100.0 fL   MCH 32.2 26.0 - 34.0 pg   MCHC 35.0 32.0 - 36.0 g/dL   RDW 95.6 21.3 - 08.6 %   Platelets 190 150 - 440 K/uL   Protime-INR     Status: None   Collection Time: 07/08/17  6:27 AM  Result Value Ref Range   Prothrombin Time 14.5 11.4 - 15.2 seconds   INR 1.14   APTT     Status: None   Collection Time: 07/08/17  6:27 AM  Result Value Ref Range   aPTT 31 24 - 36 seconds  Type and screen [facility]     Status: None   Collection Time: 07/08/17  6:27 AM  Result Value Ref Range   ABO/RH(D) A POS    Antibody Screen NEG    Sample Expiration      07/11/2017 Performed at [facility], [address] [place], [place] [address]   Glucose, capillary     Status: Abnormal   Collection Time: 07/08/17  8:30 AM  Result Value Ref Range   Glucose-Capillary 129 (H) 65 - 99 mg/dL    Dg Pelvis 1-2 Views  Result Date: 07/08/2017 CLINICAL DATA:  Known left hip fracture following mechanical fall yesterday. History of previous seizure activity and chronic right lower extremity weakness. EXAM: PELVIS - 1-2 VIEW COMPARISON:  Limited views of the pelvis from an abdominal radiograph of Jul 12, 2015 FINDINGS: There is an acute comminuted mildly distracted fracture of the intertrochanteric region of the left hip. The bones are subjectively osteopenic. The iliac bones and pubic bones appear intact. The right hip is grossly normal. IMPRESSION: Acute intertrochanteric fracture of the left hip. Electronically Signed   By: David  [place] M.D.   On: 07/08/2017 07:24   Dg Femur Min 2 Views Left  Result Date: 07/08/2017 CLINICAL DATA:  Status post fall.  Known left hip fracture. EXAM: LEFT FEMUR 2 VIEWS COMPARISON:  AP view of the pelvis of today's date FINDINGS: Again demonstrated is the comminuted and distracted intertrochanteric fracture of the left hip. There is mild superior migration of the femoral shaft with respect to the base of the femoral neck. The femoral shaft is intact. The observed portions of the left knee are within the limits of normal for age. IMPRESSION: Acute comminuted distracted  intertrochanteric fracture of the left hip. Electronically Signed   By: David  [place] M.D.   On: 07/08/2017 07:26    Assessment/Plan: Day of Surgery   Principal Problem:   Hip fracture, unspecified laterality, closed, initial encounter [facility]) Active Problems:   Hip fracture [facility])  Patient has been cleared by hospitalist for surgery.   I have reviewed the labs and radiographic studies in preparation for this case.  Potassium was ordered this AM.  I have reviewed the details of the operation and the post-op course with the patient and her family.   I also discussed the risks and benefits of surgery with the patient and her family.   They understand the risks include, but are not limited to infection, bleeding, nerve or blood vessel injury, persistent hip pain, leg length discrepancy, change in lower extremity rotation, hardware failure or screw cut-out and the need for further surgery.   They also understand the medical risks include DVT/PE, MI, stroke, pneumonia, respiratory failure and death.  The patient and her family were in agreement with the plan for surgery.    Juanell Fairly , MD 07/08/2017, 10:18 AM

## 2017-07-09 ENCOUNTER — Inpatient Hospital Stay: Payer: Medicare HMO

## 2017-07-09 ENCOUNTER — Encounter: Payer: Self-pay | Admitting: Orthopedic Surgery

## 2017-07-09 LAB — BASIC METABOLIC PANEL
ANION GAP: 8 (ref 5–15)
BUN: 9 mg/dL (ref 6–20)
CO2: 26 mmol/L (ref 22–32)
Calcium: 7.3 mg/dL — ABNORMAL LOW (ref 8.9–10.3)
Chloride: 97 mmol/L — ABNORMAL LOW (ref 101–111)
Creatinine, Ser: 0.72 mg/dL (ref 0.44–1.00)
GFR calc Af Amer: >60 mL/min (ref >60)
GLUCOSE: 106 mg/dL — AB (ref 65–99)
Potassium: 3.5 mmol/L (ref 3.5–5.1)
SODIUM: 131 mmol/L — AB (ref 135–145)

## 2017-07-09 LAB — GLUCOSE, CAPILLARY: GLUCOSE-CAPILLARY: 97 mg/dL (ref 65–99)

## 2017-07-09 LAB — CBC
HCT: 26.6 % — ABNORMAL LOW (ref 35.0–47.0)
Hemoglobin: 9.1 g/dL — ABNORMAL LOW (ref 12.0–16.0)
MCH: 31.3 pg (ref 26.0–34.0)
MCHC: 34.4 g/dL (ref 32.0–36.0)
MCV: 91.1 fL (ref 80.0–100.0)
PLATELETS: 159 10*3/uL (ref 150–440)
RBC: 2.92 MIL/uL — ABNORMAL LOW (ref 3.80–5.20)
RDW: 13.1 % (ref 11.5–14.5)
WBC: 9 10*3/uL (ref 3.6–11.0)

## 2017-07-09 MED ORDER — LEVOTHYROXINE SODIUM 25 MCG PO TABS
25.0000 ug | ORAL_TABLET | Freq: Every day | ORAL | Status: DC
  Administered 2017-07-10: 25 ug via ORAL
  Filled 2017-07-09: qty 1

## 2017-07-09 MED ORDER — CITALOPRAM HYDROBROMIDE 20 MG PO TABS
20.0000 mg | ORAL_TABLET | Freq: Every day | ORAL | Status: DC
  Administered 2017-07-09 – 2017-07-10 (×2): 20 mg via ORAL
  Filled 2017-07-09 (×2): qty 1

## 2017-07-09 MED ORDER — ASPIRIN 325 MG PO TABS
325.0000 mg | ORAL_TABLET | Freq: Every day | ORAL | Status: DC
  Administered 2017-07-09 – 2017-07-10 (×2): 325 mg via ORAL
  Filled 2017-07-09 (×2): qty 1

## 2017-07-09 NOTE — Progress Notes (Signed)
[facility] [place] [facility]   PATIENT NAME: Brittany Hogan    MR#:  [id number]  DATE OF BIRTH:  [birth year]  SUBJECTIVE:  patient came in after weakness mechanical fall at home. She is a chronically weak right leg due to previous stroke. Patient did not have her cane/walker with her and had a mechanical fall. No loss of consciousness. She was seen in the PACU. She is wide awake alert feeling hungry  REVIEW OF SYSTEMS:   Review of Systems  Constitutional: Negative for chills, fever and weight loss.  HENT: Negative for ear discharge, ear pain and nosebleeds.   Eyes: Negative for blurred vision, pain and discharge.  Respiratory: Negative for sputum production, shortness of breath, wheezing and stridor.   Cardiovascular: Negative for chest pain, palpitations, orthopnea and PND.  Gastrointestinal: Negative for abdominal pain, diarrhea, nausea and vomiting.  Genitourinary: Negative for frequency and urgency.  Musculoskeletal: Positive for joint pain. Negative for back pain.  Neurological: Negative for sensory change, speech change, focal weakness and weakness.  Psychiatric/Behavioral: Negative for depression and hallucinations. The patient is not nervous/anxious.     DRUG ALLERGIES:  No Known Allergies  VITALS:  Blood pressure (!) 103/58, pulse 94, temperature 98.5 F (36.9 C), temperature source Oral, resp. rate 18, height  (1.575 m), weight 74.4 kg (164 lb), SpO2 90 %.  PHYSICAL EXAMINATION:   Physical Exam  GENERAL:  82 y.o.-year-old patient lying in the bed with no acute distress.  EYES: Pupils equal, round, reactive to light and accommodation. No scleral icterus. Extraocular muscles intact.  HEENT: Head atraumatic, normocephalic. Oropharynx and nasopharynx clear.  NECK:  Supple, no jugular venous distention. No thyroid enlargement, no tenderness.  LUNGS: Normal breath sounds bilaterally, no wheezing, rales, rhonchi. No use of accessory  muscles of respiration.  CARDIOVASCULAR: S1, S2 normal. No murmurs, rubs, or gallops.  ABDOMEN: Soft, nontender, nondistended. Bowel sounds present. No organomegaly or mass.  EXTREMITIES: No cyanosis, clubbing or edema b/l.   Left hip surgical nursing present NEUROLOGIC: Cranial nerves II through XII are intact. No focal Motor or sensory deficits b/l.   PSYCHIATRIC:  patient is alert and oriented x 3.  SKIN: No obvious rash, lesion, or ulcer.   LABORATORY PANEL:  CBC Recent Labs  Lab 07/09/17 0440  WBC 9.0  HGB 9.1*  HCT 26.6*  PLT 159    Chemistries  Recent Labs  Lab 07/09/17 0440  NA 131*  K 3.5  CL 97*  CO2 26  GLUCOSE 106*  BUN 9  CREATININE 0.72  CALCIUM 7.3*   Cardiac Enzymes No results for input(s): TROPONINI in the last 168 hours. RADIOLOGY:  Dg Pelvis 1-2 Views  Result Date: 07/08/2017 CLINICAL DATA:  Known left hip fracture following mechanical fall yesterday. History of previous seizure activity and chronic right lower extremity weakness. EXAM: PELVIS - 1-2 VIEW COMPARISON:  Limited views of the pelvis from an abdominal radiograph of Jul 12, 2015 FINDINGS: There is an acute comminuted mildly distracted fracture of the intertrochanteric region of the left hip. The bones are subjectively osteopenic. The iliac bones and pubic bones appear intact. The right hip is grossly normal. IMPRESSION: Acute intertrochanteric fracture of the left hip. Electronically Signed   By: David  [place] M.D.   On: 07/08/2017 07:24   Dg Hand Complete Right  Result Date: 07/09/2017 CLINICAL DATA:  Recent fall with right hand pain and swelling, initial encounter [facility] COMPARISON:  None. FINDINGS:  Degenerative changes are noted most marked in the first MCP joint with marked remodeling of the joint. No acute fracture or dislocation is seen. Interphalangeal degenerative changes are noted as well. IMPRESSION: Degenerative change without acute abnormality.  Electronically Signed   By: Alcide Clever M.D.   On: 07/09/2017 09:16   Dg Hip Operative Unilat W Or W/o Pelvis Left  Result Date: 07/08/2017 CLINICAL DATA:  Status post ORIF for acute left hip fracture. EXAM: OPERATIVE left HIP (WITH PELVIS IF PERFORMED) 6 VIEWS TECHNIQUE: Fluoroscopic spot image(s) were submitted for interpretation post-operatively. Fluoro time reported is 1 minutes, 7 seconds COMPARISON:  Preoperative study of Jul 07, 2017 FINDINGS: The patient has undergone intramedullary rod and telescoping screw placement for fixation of a fracture of the intertrochanteric region of the left hip. Radiographic positioning of the appliances is good. The fracture fragments are now in near anatomic in alignment. IMPRESSION: ORIF of an intertrochanteric fracture of the left hip. Electronically Signed   By: David  [place] M.D.   On: 07/08/2017 12:22   Dg Femur Min 2 Views Left  Result Date: 07/08/2017 CLINICAL DATA:  Postop left femur. EXAM: LEFT FEMUR 2 VIEWS COMPARISON:  07/08/2017. FINDINGS: ORIF left femur. Hardware intact. Anatomic alignment. Postsurgical changes noted about the soft tissues about the left hip. Surgical staples noted over the left hip. IMPRESSION: ORIF left femur.  Hardware intact.  Anatomic alignment. Electronically Signed   By: Maisie Fus  Register   On: 07/08/2017 13:49   Dg Femur Min 2 Views Left  Result Date: 07/08/2017 CLINICAL DATA:  Status post fall.  Known left hip fracture. EXAM: LEFT FEMUR 2 VIEWS COMPARISON:  AP view of the pelvis of today's date FINDINGS: Again demonstrated is the comminuted and distracted intertrochanteric fracture of the left hip. There is mild superior migration of the femoral shaft with respect to the base of the femoral neck. The femoral shaft is intact. The observed portions of the left knee are within the limits of normal for age. IMPRESSION: Acute comminuted distracted intertrochanteric fracture of the left hip. Electronically Signed   By: David   [place] M.D.   On: 07/08/2017 07:26   ASSESSMENT AND PLAN:  Brittany Hogan  is a 82 y.o. female with a known history of seizures, acid reflux, osteoporosis and stroke with residual right lower extremity weakness. Patient is a transfer from [facility] for left hip fracture, status post mechanical fall yday.  1.  Left hip fracture-status post mechanical fall at home weakness by husband - status post left hip hemi arthroplasty POD #1 by Dr. Martha Clan. Input appreciated. -Physical therapy to see pt = Resume diet  2.  Seizure disorder, stable, continue home medications. -Patient takes phenobarbital. No evidence of seizure before or after the fall  3.  Right lower extremity paresis, status post old stroke.   4.Hyponatremia/ hypokalemia -IVF and replete K  CSW for d/c planning  Case discussed with Care Management/Social Worker. Management plans discussed with the patient, family and they are in agreement.  CODE STATUS: Full  DVT Prophylaxis: lovenox  TOTAL TIME TAKING CARE OF THIS PATIENT: *30* minutes.  >50% time spent on counselling and coordination of care  POSSIBLE D/C IN 1-2 DAYS, DEPENDING ON CLINICAL CONDITION.  Note: This dictation was prepared with Dragon dictation along with smaller phrase technology. Any transcriptional errors that result from this process are unintentional.  Enedina Finner M.D on 07/09/2017 at 2:51 PM  Between 7am to 6pm - Pager - [phone]  After 6pm go to  [email or web address] - password [email or web address]  [facility]  Office  [phone]  CC: Primary care physician; Jaclyn Shaggy, MDPatient ID: Brittany Hogan, female   DOB: Feb 07, 1936, 82 y.o.   MRN: [id number]

## 2017-07-09 NOTE — Anesthesia Postprocedure Evaluation (Signed)
Anesthesia Post Note  Patient: Brittany Hogan  Procedure(s) Performed: INTRAMEDULLARY (IM) NAIL INTERTROCHANTRIC (Left Hip)  Patient location during evaluation: PACU Anesthesia Type: Spinal Level of consciousness: awake and alert and oriented Pain management: pain level controlled Vital Signs Assessment: post-procedure vital signs reviewed and stable Respiratory status: spontaneous breathing Cardiovascular status: blood pressure returned to baseline Anesthetic complications: no     Last Vitals:  Vitals:   07/09/17 0404 07/09/17 0758  BP: (!) 97/59 (!) 103/58  Pulse: 87 88  Resp: 19 18  Temp: 37.3 C 36.9 C  SpO2: 92% 91%    Last Pain:  Vitals:   07/09/17 1200  TempSrc:   PainSc: 0-No pain                 [name]

## 2017-07-09 NOTE — Progress Notes (Signed)
Pts in and out 500 ml.

## 2017-07-09 NOTE — Progress Notes (Addendum)
[employer] [facility] authorization through [facility] health has been received, authorization # [id number]. Plan is for patient to D/C to [facility] pending medical clearance. Patient is aware of above. Patient's husband [name] is aware of above. [facility] liaison is aware of above.   [employer], LCSW [phone]

## 2017-07-09 NOTE — Progress Notes (Signed)
Clinical Child psychotherapist (CSW) started [facility] authorization through [employer] today.   [employer], LCSW [phone]

## 2017-07-09 NOTE — Evaluation (Signed)
Physical Therapy Evaluation Patient Details Name: Brittany Hogan MRN: [id number] DOB: [birth year] Today's Date: 07/09/2017   History of Present Illness  Pt is a 82 y/o F who presented s/p fall withresultant L intertrochanteric fx.  Pt is now s/p L intramedullary fixation.  [name] PMH includes seizure, stroke, R shoulder surgery.     Clinical Impression  Pt admitted with above diagnosis. Pt currently with functional limitations due to the deficits listed below (see PT Problem List). Brittany Hogan was very pleasant and agreeable to therapy.  She currently requires mod assist for bed mobility, mod assist for sit<>stand, and min assist to take several steps and pivot to chair.  BP supine at start of session 113/61, sitting EOB 97/58 with slight dizzienss which improves quickly, sitting after several minutes sitting EOB 115/56, sitting at end of session 113/55. Given pt's current mobility status, recommending SNF at d/c.  Pt will benefit from skilled PT to increase their independence and safety with mobility to allow discharge to the venue listed below.      Follow Up Recommendations SNF    Equipment Recommendations  None recommended by PT    Recommendations for Other Services       Precautions / Restrictions Precautions Precautions: Fall Restrictions Weight Bearing Restrictions: Yes LLE Weight Bearing: Weight bearing as tolerated      Mobility  Bed Mobility Overal bed mobility: Needs Assistance Bed Mobility: Supine to Sit     Supine to sit: Mod assist;HOB elevated     General bed mobility comments: Cues for sequencing and assist provided to elevate trunk with use of bed pad to assist with scooting to EOB.  Pt uses bed rail to assist with pulling to sitting.   Transfers Overall transfer level: Needs assistance Equipment used: Rolling walker (2 wheeled) Transfers: Sit to/from [employer] Sit to Stand: Mod assist;From elevated surface Stand pivot transfers: Min assist        General transfer comment: Cues for proper hand placement but pt moves both hands to RW last minute when standing rather than pushing from bed.  Bed elevated to aid in sit>stand with assist provided to boost to standing.  Pt requires cues to weight shift to LLE and bend R knee to promote unweighting of RLE to step when performing stand pivot transfer. Cues to reach back for armrests to sit with well controlled descent to sit.   Ambulation/Gait Ambulation/Gait assistance: Min assist Ambulation Distance (Feet): 4 Feet Assistive device: Rolling walker (2 wheeled) Gait Pattern/deviations: Decreased step length - right;Decreased step length - left;Trunk flexed Gait velocity: decreased   General Gait Details: Pt requires same cues for weight shifting as with stand pivot transfer.  Cues for upright posture.  Min assist to remain steady.   Stairs            Wheelchair Mobility    Modified Rankin (Stroke Patients Only)       Balance Overall balance assessment: Needs assistance Sitting-balance support: No upper extremity supported;Feet supported Sitting balance-Leahy Scale: Good     Standing balance support: Bilateral upper extremity supported;During functional activity Standing balance-Leahy Scale: Poor Standing balance comment: Relies on UE support for static and dynamic activities                             Pertinent Vitals/Pain Pain Assessment: 0-10 Pain Score: 6  Pain Location: R hip Pain Descriptors / Indicators: Aching;Grimacing;Guarding;Discomfort Pain Intervention(s): Limited activity within patient's tolerance;Monitored  during session;Repositioned;Premedicated before session    Home Living Family/patient expects to be discharged to:: Skilled nursing facility Living Arrangements: Spouse/significant other Available Help at Discharge: Family;Available 24 hours/day(Husband who is unable to provide physical assist) Type of Home: House Home Access: Stairs  to enter Entrance Stairs-Rails: Right Entrance Stairs-Number of Steps: 3 Home Layout: One level Home Equipment: Walker - 2 wheels;Walker - 4 wheels;Cane - single point;Bedside commode;Grab bars - tub/shower      Prior Function Level of Independence: Independent with assistive device(s)         Comments: Pt ambulated without AD in home but furniture surfed.  She did use RW in community.  Several falls in the past 6 months.  Pt was independent with bathing, dressing.      Hand Dominance        Extremity/Trunk Assessment   Upper Extremity Assessment Upper Extremity Assessment: Overall WFL for tasks assessed    Lower Extremity Assessment Lower Extremity Assessment: RLE deficits/detail;LLE deficits/detail RLE Deficits / Details: Strength grossly 4/5 LLE Deficits / Details: Limited due to muscle guarding and pain.  Pt requires assist to perform SLR       Communication   Communication: No difficulties  Cognition Arousal/Alertness: Awake/alert Behavior During Therapy: WFL for tasks assessed/performed Overall Cognitive Status: Within Functional Limits for tasks assessed                                        General Comments General comments (skin integrity, edema, etc.): BP supine at start of session 113/61, sitting EOB 97/58 with slight dizzienss which improves quickly, sitting after several minutes sitting EOB 115/56, sitting at end of session 113/55.     Exercises General Exercises - Lower Extremity Ankle Circles/Pumps: AROM;Both;10 reps;Supine Quad Sets: Strengthening;Both;10 reps;Supine Gluteal Sets: Strengthening;Both;10 reps;Supine Straight Leg Raises: AAROM;Left;10 reps;Supine   Assessment/Plan    PT Assessment Patient needs continued PT services  PT Problem List Decreased strength;Decreased range of motion;Decreased activity tolerance;Decreased balance;Decreased mobility;Decreased knowledge of use of DME;Decreased safety awareness;Pain        PT Treatment Interventions DME instruction;Gait training;Stair training;Functional mobility training;Therapeutic activities;Therapeutic exercise;Balance training;Neuromuscular re-education;Patient/family education;Wheelchair mobility training;Modalities    PT Goals (Current goals can be found in the Care Plan section)  Acute Rehab PT Goals Patient Stated Goal: to return to PLOF PT Goal Formulation: With patient Time For Goal Achievement: 07/23/17 Potential to Achieve Goals: Good    Frequency BID   Barriers to discharge Inaccessible home environment;Decreased caregiver support Husband unable to provide the amount of physical assist the pt requires    Co-evaluation               AM-PAC PT "6 Clicks" Daily Activity  Outcome Measure Difficulty turning over in bed (including adjusting bedclothes, sheets and blankets)?: Unable Difficulty moving from lying on back to sitting on the side of the bed? : Unable Difficulty sitting down on and standing up from a chair with arms (e.g., wheelchair, bedside commode, etc,.)?: Unable Help needed moving to and from a bed to chair (including a wheelchair)?: A Lot Help needed walking in hospital room?: A Lot Help needed climbing 3-5 steps with a railing? : Total 6 Click Score: 8    End of Session Equipment Utilized During Treatment: Gait belt Activity Tolerance: Patient tolerated treatment well;Patient limited by fatigue;Patient limited by pain Patient left: in chair;with call bell/phone within reach;with chair alarm set;with family/visitor  present Nurse Communication: Mobility status;Other (comment)(BP readings (notified MD as well)) PT Visit Diagnosis: Pain;Unsteadiness on feet (R26.81);Other abnormalities of gait and mobility (R26.89);History of falling (Z91.81);Muscle weakness (generalized) (M62.81);Difficulty in walking, not elsewhere classified (R26.2) Pain - Right/Left: Left Pain - part of body: Hip    Time: 4098-1191 PT Time  Calculation (min) (ACUTE ONLY): 33 min   Charges:   PT Evaluation $PT Eval Low Complexity: 1 Low PT Treatments $Therapeutic Exercise: 8-22 mins $Therapeutic Activity: 8-22 mins   PT G Codes:        Encarnacion Chu PT, DPT 07/09/2017, 10:04 AM

## 2017-07-09 NOTE — Progress Notes (Signed)
MEDICATION RELATED CONSULT NOTE - INITIAL   Pharmacy Consult for phenobarbital dose recommendations Indication: phenobarbital PTA  No Known Allergies  Patient Measurements: Height:  (157.5 cm) Weight: 164 lb (74.4 kg) IBW/kg (Calculated) : 50.1 Adjusted Body Weight:   Vital Signs: Temp: 98.5 F (36.9 C) (05/29 0758) Temp Source: Oral (05/29 0758) BP: 103/58 (05/29 0758) Pulse Rate: 88 (05/29 0758) Intake/Output from previous day: 05/28 0701 - 05/29 0700 In: 1798.8 [I.V.:1798.8] Out: 1515 [Urine:1415; Blood:100] Intake/Output from this shift: No intake/output data recorded.  Labs: Recent Labs    07/08/17 0450 07/08/17 0627 07/09/17 0440  WBC 8.6  --  9.0  HGB 11.8*  --  9.1*  HCT 33.7*  --  26.6*  PLT 190  --  159  APTT  --  31  --   CREATININE 0.63  --  0.72   Estimated Creatinine Clearance: 53.8 mL/min (by C-G formula based on SCr of 0.72 mg/dL).   Microbiology: Recent Results (from the past 720 hour(s))  MRSA PCR Screening     Status: None   Collection Time: 07/07/17 11:35 PM  Result Value Ref Range Status   MRSA by PCR NEGATIVE NEGATIVE Final    Comment:        The GeneXpert MRSA Assay (FDA approved for NASAL specimens only), is one component of a comprehensive MRSA colonization surveillance program. It is not intended to diagnose MRSA infection nor to guide or monitor treatment for MRSA infections. Performed at [facility], [address] [place], [place] [address]     Medical History: Past Medical History:  Diagnosis Date  . Seizures (HCC)   . Stroke [facility])     Medications:  Infusions:  . sodium chloride Stopped (07/08/17 1108)  . 0.9 % NaCl with KCl 20 mEq / L 75 mL/hr at 07/09/17 0555  . methocarbamol (ROBAXIN)  IV      Assessment: 14 yof admitted for fracture post mechanical fall. Phenobarbital has been filled by patient for at least two years at the current dosage (64.2 mg po AM and 97.2 mg po PM).   Goal of  Therapy:  Seizure freedom Prevent adverse effects  Plan:  Phenobarbital level was drawn yesterday afternoon and resulted at 41 mcg/ml. Correlation between serum level and clinical result has not been clearly established. Levels greater than 50 mcg/ml are generally avoided due to increased drowsiness. No change in dosage at this point. MD did not believe fall was related to drowsiness/ADE and patient has been maintained on this schedule for two years. Risk of reducing dose and precipitating seizure is greater than benefit of dose decrease at this point. Please make sure result is forwarded to PCP at discharge.  Carola Frost, Pharm.D., BCPS Clinical Pharmacist 07/09/2017,8:57 AM

## 2017-07-09 NOTE — Progress Notes (Signed)
Pt has not voided since foley removed early this morning.  Bladder scan 386 ml. MD Allena Katz notified, orders received for in and out for bladder scan greater than 500 ml. Order placed.

## 2017-07-09 NOTE — Progress Notes (Signed)
Physical Therapy Treatment Patient Details Name: Brittany Hogan MRN: [id number] DOB: 1935/11/24 Today's Date: 07/09/2017    History of Present Illness Pt is a 82 y/o F who presented s/p fall withresultant L intertrochanteric fx.  Pt is now s/p L intramedullary fixation.  [name] PMH includes seizure, stroke, R shoulder surgery.     PT Comments    Pt agreeable to PT; reports no L hip pain at rest, but increases to 7/10 with movement/weight bearing. Pt wishes back to bed. Pt requires Mod A for STS transfer and several steps for ambulation chair to bed. Requires Max A for sit to supine bed mobility. No further treatment at this time, as pt too fatigued and is to have a bladder scan. Continue PT to progress strength, endurance to improve functional mobility.    Follow Up Recommendations  SNF     Equipment Recommendations  None recommended by PT    Recommendations for Other Services       Precautions / Restrictions Precautions Precautions: Fall Restrictions Weight Bearing Restrictions: Yes LLE Weight Bearing: Weight bearing as tolerated    Mobility  Bed Mobility Overal bed mobility: Needs Assistance Bed Mobility: Sit to Supine       Sit to supine: Max assist   General bed mobility comments: Unable to assist and in fact inhibits transfer by holding bed rails despite cues (watch shoulders)  Transfers Overall transfer level: Needs assistance Equipment used: Rolling walker (2 wheeled) Transfers: Sit to/from [employer] Sit to Stand: Mod assist         General transfer comment: cues for hand placement and QS/GS activation  Ambulation/Gait Ambulation/Gait assistance: Mod assist Ambulation Distance (Feet): 2 Feet Assistive device: Rolling walker (2 wheeled) Gait Pattern/deviations: Decreased dorsiflexion - right;Decreased dorsiflexion - left(unable to clear either foot)     General Gait Details: Pivots R foot side to side to advance. Slides LLE first 2  steps actively; requires assist to slide thereafter    Stairs             Wheelchair Mobility    Modified Rankin (Stroke Patients Only)       Balance Overall balance assessment: Needs assistance Sitting-balance support: Bilateral upper extremity supported;Feet supported Sitting balance-Leahy Scale: Good     Standing balance support: Bilateral upper extremity supported Standing balance-Leahy Scale: Poor Standing balance comment: poor postural control bending nearly to 90 degrees hip flexion when attempting to step                            Cognition Arousal/Alertness: Awake/alert Behavior During Therapy: WFL for tasks assessed/performed Overall Cognitive Status: Within Functional Limits for tasks assessed                                        Exercises      General Comments        Pertinent Vitals/Pain Pain Assessment: 0-10 Pain Score: 7 (none at rest) Pain Location: L hip Pain Descriptors / Indicators: Aching;Grimacing;Operative site guarding Pain Intervention(s): Limited activity within patient's tolerance;Repositioned    Home Living                      Prior Function            PT Goals (current goals can now be found in the care plan section)  Frequency    BID      PT Plan Current plan remains appropriate    Co-evaluation              AM-PAC PT "6 Clicks" Daily Activity  Outcome Measure  Difficulty turning over in bed (including adjusting bedclothes, sheets and blankets)?: Unable Difficulty moving from lying on back to sitting on the side of the bed? : Unable Difficulty sitting down on and standing up from a chair with arms (e.g., wheelchair, bedside commode, etc,.)?: Unable Help needed moving to and from a bed to chair (including a wheelchair)?: A Lot Help needed walking in hospital room?: A Lot Help needed climbing 3-5 steps with a railing? : Total 6 Click Score: 8    End of  Session Equipment Utilized During Treatment: Gait belt Activity Tolerance: Patient limited by fatigue;Patient limited by pain Patient left: in bed;with call bell/phone within reach;with bed alarm set;with family/visitor present;with SCD's reapplied Nurse Communication: Mobility status;Other (comment)(no urge to void) PT Visit Diagnosis: Pain;Unsteadiness on feet (R26.81);Other abnormalities of gait and mobility (R26.89);History of falling (Z91.81);Muscle weakness (generalized) (M62.81);Difficulty in walking, not elsewhere classified (R26.2) Pain - Right/Left: Left Pain - part of body: Hip     Time: 1610-9604 PT Time Calculation (min) (ACUTE ONLY): 20 min  Charges:  $Therapeutic Activity: 8-22 mins                    G CodesScot Dock, PTA 07/09/2017, 2:44 PM

## 2017-07-09 NOTE — Progress Notes (Signed)
Subjective:  Postop day #1 status post intramedullary fixation for left intertrochanteric hip fracture.  Patient is up out of bed to a chair.  Patient reports left hip pain as mild.  Patient had her Foley catheter removed this morning and is due to void.  Objective:   VITALS:   Vitals:   07/08/17 2119 07/09/17 0020 07/09/17 0404 07/09/17 0758  BP: 108/64 98/60 (!) 97/59 (!) 103/58  Pulse: 87 89 87 88  Resp: Temp: 99.8 F (37.7 C) 100 F (37.8 C) 99.2 F (37.3 C) 98.5 F (36.9 C)  TempSrc: Oral Oral Oral Oral  SpO2: 92% 92% 92% 91%  Weight:      Height:        PHYSICAL EXAM: Left lower extremity: Neurovascular intact Sensation intact distally Intact pulses distally Dorsiflexion/Plantar flexion intact Incision: dressing C/D/I No cellulitis present Compartment soft  LABS  Results for orders placed or performed during the hospital encounter of 07/07/17 (from the past 24 hour(s))  Phenobarbital level     Status: Abnormal   Collection Time: 07/08/17  4:13 PM  Result Value Ref Range   Phenobarbital 41.0 (H) 15.0 - 30.0 ug/mL  CBC     Status: Abnormal   Collection Time: 07/09/17  4:40 AM  Result Value Ref Range   WBC 9.0 3.6 - 11.0 K/uL   RBC 2.92 (L) 3.80 - 5.20 MIL/uL   Hemoglobin 9.1 (L) 12.0 - 16.0 g/dL   HCT 46.9 (L) 62.9 - 52.8 %   MCV 91.1 80.0 - 100.0 fL   MCH 31.3 26.0 - 34.0 pg   MCHC 34.4 32.0 - 36.0 g/dL   RDW 41.3 24.4 - 01.0 %   Platelets 159 150 - 440 K/uL  Basic metabolic panel     Status: Abnormal   Collection Time: 07/09/17  4:40 AM  Result Value Ref Range   Sodium 131 (L) 135 - 145 mmol/L   Potassium 3.5 3.5 - 5.1 mmol/L   Chloride 97 (L) 101 - 111 mmol/L   CO2 26 22 - 32 mmol/L   Glucose, Bld 106 (H) 65 - 99 mg/dL   BUN 9 6 - 20 mg/dL   Creatinine, Ser 2.72 0.44 - 1.00 mg/dL   Calcium 7.3 (L) 8.9 - 10.3 mg/dL   GFR calc non Af Amer >60 >60 mL/min   GFR calc Af Amer >60 >60 mL/min   Anion gap 8 5 - 15  Glucose, capillary      Status: None   Collection Time: 07/09/17  7:59 AM  Result Value Ref Range   Glucose-Capillary 97 65 - 99 mg/dL   Comment 1 Notify RN     Dg Pelvis 1-2 Views  Result Date: 07/08/2017 CLINICAL DATA:  Known left hip fracture following mechanical fall yesterday. History of previous seizure activity and chronic right lower extremity weakness. EXAM: PELVIS - 1-2 VIEW COMPARISON:  Limited views of the pelvis from an abdominal radiograph of Jul 12, 2015 FINDINGS: There is an acute comminuted mildly distracted fracture of the intertrochanteric region of the left hip. The bones are subjectively osteopenic. The iliac bones and pubic bones appear intact. The right hip is grossly normal. IMPRESSION: Acute intertrochanteric fracture of the left hip. Electronically Signed   By: David  [place] M.D.   On: 07/08/2017 07:24   Dg Hand Complete Right  Result Date: 07/09/2017 CLINICAL DATA:  Recent fall with right hand pain and swelling, initial encounter EXAM: RIGHT HAND - COMPLETE 3+ VIEW COMPARISON:  None. FINDINGS: Degenerative changes are noted most marked in the first MCP joint with marked remodeling of the joint. No acute fracture or dislocation is seen. Interphalangeal degenerative changes are noted as well. IMPRESSION: Degenerative change without acute abnormality. Electronically Signed   By: Alcide Clever M.D.   On: 07/09/2017 09:16   Dg Hip Operative Unilat W Or W/o Pelvis Left  Result Date: 07/08/2017 CLINICAL DATA:  Status post ORIF for acute left hip fracture. EXAM: OPERATIVE left HIP (WITH PELVIS IF PERFORMED) 6 VIEWS TECHNIQUE: Fluoroscopic spot image(s) were submitted for interpretation post-operatively. Fluoro time reported is 1 minutes, 7 seconds COMPARISON:  Preoperative study of Jul 07, 2017 FINDINGS: The patient has undergone intramedullary rod and telescoping screw placement for fixation of a fracture of the intertrochanteric region of the left hip. Radiographic positioning of the appliances is good.  The fracture fragments are now in near anatomic in alignment. IMPRESSION: ORIF of an intertrochanteric fracture of the left hip. Electronically Signed   By: David  [place] M.D.   On: 07/08/2017 12:22   Dg Femur Min 2 Views Left  Result Date: 07/08/2017 CLINICAL DATA:  Postop left femur. EXAM: LEFT FEMUR 2 VIEWS COMPARISON:  07/08/2017. FINDINGS: ORIF left femur. Hardware intact. Anatomic alignment. Postsurgical changes noted about the soft tissues about the left hip. Surgical staples noted over the left hip. IMPRESSION: ORIF left femur.  Hardware intact.  Anatomic alignment. Electronically Signed   By: Maisie Fus  Register   On: 07/08/2017 13:49   Dg Femur Min 2 Views Left  Result Date: 07/08/2017 CLINICAL DATA:  Status post fall.  Known left hip fracture. EXAM: LEFT FEMUR 2 VIEWS COMPARISON:  AP view of the pelvis of today's date FINDINGS: Again demonstrated is the comminuted and distracted intertrochanteric fracture of the left hip. There is mild superior migration of the femoral shaft with respect to the base of the femoral neck. The femoral shaft is intact. The observed portions of the left knee are within the limits of normal for age. IMPRESSION: Acute comminuted distracted intertrochanteric fracture of the left hip. Electronically Signed   By: David  [place] M.D.   On: 07/08/2017 07:26    Assessment/Plan: 1 Day Post-Op   Principal Problem:   Hip fracture, unspecified laterality, closed, initial encounter [facility]) Active Problems:   Hip fracture (HCC)  Continue with physical therapy.  Patient's pain is well controlled.  Hemoglobin and hematocrit remained stable.  Begin Lovenox today for DVT prophylaxis.    Juanell Fairly , MD 07/09/2017, 1:58 PM

## 2017-07-10 LAB — BASIC METABOLIC PANEL
Anion gap: 9 (ref 5–15)
BUN: 10 mg/dL (ref 6–20)
CO2: 27 mmol/L (ref 22–32)
Calcium: 7.3 mg/dL — ABNORMAL LOW (ref 8.9–10.3)
Chloride: 98 mmol/L — ABNORMAL LOW (ref 101–111)
Creatinine, Ser: 0.72 mg/dL (ref 0.44–1.00)
GFR calc Af Amer: >60 mL/min (ref >60)
GLUCOSE: 126 mg/dL — AB (ref 65–99)
POTASSIUM: 3.5 mmol/L (ref 3.5–5.1)
Sodium: 134 mmol/L — ABNORMAL LOW (ref 135–145)

## 2017-07-10 LAB — GLUCOSE, CAPILLARY: Glucose-Capillary: 122 mg/dL — ABNORMAL HIGH (ref 65–99)

## 2017-07-10 LAB — CBC
HEMATOCRIT: 26.3 % — AB (ref 35.0–47.0)
HEMOGLOBIN: 8.9 g/dL — AB (ref 12.0–16.0)
MCH: 31.1 pg (ref 26.0–34.0)
MCHC: 34.1 g/dL (ref 32.0–36.0)
MCV: 91.4 fL (ref 80.0–100.0)
Platelets: 185 10*3/uL (ref 150–440)
RBC: 2.87 MIL/uL — ABNORMAL LOW (ref 3.80–5.20)
RDW: 12.6 % (ref 11.5–14.5)
WBC: 11.9 10*3/uL — ABNORMAL HIGH (ref 3.6–11.0)

## 2017-07-10 MED ORDER — ASPIRIN EC 325 MG PO TBEC: 325.0000 mg | DELAYED_RELEASE_TABLET | Freq: Every day | ORAL | 0 refills | Status: DC

## 2017-07-10 MED ORDER — HYDROCODONE-ACETAMINOPHEN 5-325 MG PO TABS: 1.0000 | ORAL_TABLET | ORAL | 0 refills | Status: AC | PRN

## 2017-07-10 MED ORDER — FLEET ENEMA 7-19 GM/118ML RE ENEM
1.0000 | ENEMA | Freq: Every day | RECTAL | Status: DC | PRN
  Administered 2017-07-10: 1 via RECTAL
  Filled 2017-07-10: qty 1

## 2017-07-10 NOTE — Progress Notes (Signed)
Physical Therapy Treatment Patient Details Name: Brittany Hogan MRN: [id number] DOB: [birth year] Today's Date: 07/10/2017    History of Present Illness 82 y/o F who presented s/p fall with resultant L intertrochanteric fx, s/p L intramedullary fixation.  PMH includes seizure, stroke, R shoulder surgery.     PT Comments    Pt showed good effort with PT session despite significant pain with movement and especially WBing.  She did was weak and limited with L hip exercises but did relatively well - however pt very much struggled with standing/ambulation and struggled to even walk the 6 ft she did do (chair following). Pt struggled to advance L LE, had multiple bouts of L leg buckling with WBing and quickly fatigued in UEs and generally with very labored minimal ambulation.    Follow Up Recommendations  SNF     Equipment Recommendations  None recommended by PT    Recommendations for Other Services       Precautions / Restrictions Precautions Precautions: Fall Restrictions Weight Bearing Restrictions: Yes LLE Weight Bearing: Weight bearing as tolerated    Mobility  Bed Mobility Overal bed mobility: Needs Assistance Bed Mobility: Sit to Supine     Supine to sit: Mod assist;Max assist     General bed mobility comments: Pt needed a lot of assist to get to EOB and then to sitting, showed good effort but unable to get to sitting w/o significant assist  Transfers Overall transfer level: Needs assistance Equipment used: Rolling walker (2 wheeled) Transfers: Sit to/from [employer] Sit to Stand: Mod assist         General transfer comment: Pt attempted to get to standing w/o assist, unable to rails weight off (raised) bed at all.  Needed considerable assist.  Ambulation/Gait Ambulation/Gait assistance: Mod assist Ambulation Distance (Feet): 6 Feet Assistive device: Rolling walker (2 wheeled)       General Gait Details: Pt highly reliant on walker for WBing,  obviously very uncomfortable taking weight through the L (multiple small and 1 large bout of buckling), also struggled to advance L LE past R, showed great effort but between fatigue and pain was unable to increase ambulation distance significantly.   Stairs             Wheelchair Mobility    Modified Rankin (Stroke Patients Only)       Balance Overall balance assessment: Needs assistance Sitting-balance support: Bilateral upper extremity supported;Feet supported Sitting balance-Leahy Scale: Good     Standing balance support: Bilateral upper extremity supported Standing balance-Leahy Scale: Poor Standing balance comment: Pt again leaning forward more than she ought, highly reliant on UEs, L LE buckling at times                            Cognition Arousal/Alertness: Awake/alert Behavior During Therapy: WFL for tasks assessed/performed Overall Cognitive Status: Within Functional Limits for tasks assessed                                        Exercises General Exercises - Lower Extremity Ankle Circles/Pumps: AROM;Both;10 reps;Supine Quad Sets: Strengthening;Both;10 reps;Supine Gluteal Sets: Strengthening;Both;10 reps;Supine Short Arc Quad: Strengthening;AROM;10 reps Heel Slides: AAROM;10 reps Hip ABduction/ADduction: AROM;10 reps;AAROM Straight Leg Raises: AAROM;Left;10 reps;Supine    General Comments        Pertinent Vitals/Pain Pain Assessment: 0-10 Pain Score: 7  Pain  Location: L hip    Home Living                      Prior Function            PT Goals (current goals can now be found in the care plan section) Progress towards PT goals: Progressing toward goals    Frequency    BID      PT Plan Current plan remains appropriate    Co-evaluation              AM-PAC PT "6 Clicks" Daily Activity  Outcome Measure  Difficulty turning over in bed (including adjusting bedclothes, sheets and blankets)?:  Unable Difficulty moving from lying on back to sitting on the side of the bed? : Unable Difficulty sitting down on and standing up from a chair with arms (e.g., wheelchair, bedside commode, etc,.)?: Unable Help needed moving to and from a bed to chair (including a wheelchair)?: A Lot Help needed walking in hospital room?: A Lot Help needed climbing 3-5 steps with a railing? : Total 6 Click Score: 8    End of Session Equipment Utilized During Treatment: Gait belt Activity Tolerance: Patient limited by fatigue;Patient limited by pain Patient left: with chair alarm set;with call bell/phone within reach;with family/visitor present Nurse Communication: Mobility status PT Visit Diagnosis: Pain;Unsteadiness on feet (R26.81);Other abnormalities of gait and mobility (R26.89);History of falling (Z91.81);Muscle weakness (generalized) (M62.81);Difficulty in walking, not elsewhere classified (R26.2) Pain - Right/Left: Left Pain - part of body: Hip     Time: 6578-4696 PT Time Calculation (min) (ACUTE ONLY): 28 min  Charges:  $Gait Training: 8-22 mins $Therapeutic Exercise: 8-22 mins                    G Codes:       Malachi Pro, DPT 07/10/2017, 10:56 AM

## 2017-07-10 NOTE — Progress Notes (Signed)
EMS called for transport. Daughter in law called for update. IV removed. Pt awaiting transfer to [facility].

## 2017-07-10 NOTE — Discharge Summary (Signed)
Brittany Hogan, is a 82 y.o. female  DOB Aug 04, 1935  MRN [id number].  Admission date:  07/07/2017  Admitting Physician  Bertrum Sol, MD  Discharge Date:  07/10/2017   Primary MD  Jaclyn Shaggy, MD  Recommendations for primary care physician for things to follow:   Follow with PCP in 1 week   Admission Diagnosis  HIP Fx   Discharge Diagnosis  HIP Fx    Principal Problem:   Hip fracture, unspecified laterality, closed, initial encounter [facility]) Active Problems:   Hip fracture [facility])      Past Medical History:  Diagnosis Date  . Seizures (HCC)   . Stroke [facility])     Past Surgical History:  Procedure Laterality Date  . APPENDECTOMY    . BREAST BIOPSY Right    neg  . CHOLECYSTECTOMY N/A 09/27/2014   Procedure: LAPAROSCOPIC CHOLECYSTECTOMY with cholangiogram;  Surgeon: Kieth Brightly, MD;  Location: [facility] ORS;  Service: General;  Laterality: N/A;  . INTRAMEDULLARY (IM) NAIL INTERTROCHANTERIC Left 07/08/2017   Procedure: INTRAMEDULLARY (IM) NAIL INTERTROCHANTRIC;  Surgeon: Juanell Fairly, MD;  Location: [facility] ORS;  Service: Orthopedics;  Laterality: Left;  . SHOULDER SURGERY Right        History of present illness and  Hospital Course:     Kindly see H&P for history of present illness and admission details, please review complete Labs, Consult reports and Test reports for all details in brief  HPI  from the history and physical done on the day of admission 82 year old female patient admitted for fall found to have left hip fracture, admitted for the same.   Hospital Course  #1 left intertrochanteric hip fracture, patient transferred from [facility] for that.  Seen by Dr. Martha Clan, continued pain medicines, admitted to medical unit, patient had intramedullary fixation for the left hip fracture by  Dr. Dr. Martha Clan on May 28, postoperatively she did well, today she can go to [facility].  Continue Norco as needed for pain control, stool softeners for constipation, patient to continue aspirin 325 mg p.o. twice daily for 1 month after that resume 325 mg daily  #2. history of seizure disorder, patient has been on seizure medicine since the age of 49, patient is on high-dose phenobarbital, discussed with her about this and she said that they tried low-dose of phenobarbital and did not work for her.  She is on phenobarbital, Dilantin. #3. 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 4.  Hyponatremia, hypokalemia, replaced with IV fluids. Stable for discharge.  Discharge Condition:stable   Follow UP  Contact information for after-discharge care    Destination    [facility] .   Service:  Skilled Nursing Contact information: [address] [place] [address] [phone]                Discharge Instructions  and  Discharge Medications      Allergies as of 07/10/2017   No Known Allergies     Medication List    TAKE these medications   aspirin EC 325 MG tablet Take 325 mg by mouth daily.   citalopram 20 MG tablet Commonly known as:  CELEXA Take 20 mg by mouth daily.   hydrochlorothiazide 25 MG tablet Commonly known as:  HYDRODIURIL Take 25 mg by mouth daily.   HYDROcodone-acetaminophen 5-325 MG tablet Commonly known as:  NORCO/VICODIN Take 1-2 tablets by mouth every 4 (four) hours as needed for moderate pain.   levothyroxine  25 MCG tablet Commonly known as:  SYNTHROID, LEVOTHROID Take 25 mcg by mouth daily.   pantoprazole 40 MG tablet Commonly known as:  PROTONIX Take 40 mg by mouth daily.   PHENobarbital 64.8 MG tablet Commonly known as:  LUMINAL Take 64.8-97.2 tablets by mouth See admin instructions. 64.8 mg every morning and 97.2 every evening   phenytoin 100 MG ER capsule Commonly known as:   DILANTIN Take 100 mg by mouth 3 (three) times daily.   polyethylene glycol packet Commonly known as:  MIRALAX Take 17 g by mouth daily as needed for mild constipation or moderate constipation.   polyethylene glycol-electrolytes 420 g solution Commonly known as:  TRILYTE Take 4,000 mLs by mouth once.         Diet and Activity recommendation: See Discharge Instructions above   Consults obtained -orthopedic consult Discharge to [facility].  Major procedures and Radiology Reports - PLEASE review detailed and final reports for all details, in brief -      Dg Pelvis 1-2 Views  Result Date: 07/08/2017 CLINICAL DATA:  Known left hip fracture following mechanical fall yesterday. History of previous seizure activity and chronic right lower extremity weakness. EXAM: PELVIS - 1-2 VIEW COMPARISON:  Limited views of the pelvis from an abdominal radiograph of Jul 12, 2015 FINDINGS: There is an acute comminuted mildly distracted fracture of the intertrochanteric region of the left hip. The bones are subjectively osteopenic. The iliac bones and pubic bones appear intact. The right hip is grossly normal. IMPRESSION: Acute intertrochanteric fracture of the left hip. Electronically Signed   By: David  [place] M.D.   On: 07/08/2017 07:24   Dg Hand Complete Right  Result Date: 07/09/2017 CLINICAL DATA:  Recent fall with right hand pain and swelling, initial encounter EXAM: RIGHT HAND - COMPLETE 3+ VIEW COMPARISON:  None. FINDINGS: Degenerative changes are noted most marked in the first MCP joint with marked remodeling of the joint. No acute fracture or dislocation is seen. Interphalangeal degenerative changes are noted as well. IMPRESSION: Degenerative change without acute abnormality. Electronically Signed   By: Alcide Clever M.D.   On: 07/09/2017 09:16   Dg Hip Operative Unilat W Or W/o Pelvis Left  Result Date: 07/08/2017 CLINICAL DATA:  Status post ORIF for acute left hip fracture. EXAM:  OPERATIVE left HIP (WITH PELVIS IF PERFORMED) 6 VIEWS TECHNIQUE: Fluoroscopic spot image(s) were submitted for interpretation post-operatively. Fluoro time reported is 1 minutes, 7 seconds COMPARISON:  Preoperative study of Jul 07, 2017 FINDINGS: The patient has undergone intramedullary rod and telescoping screw placement for fixation of a fracture of the intertrochanteric region of the left hip. Radiographic positioning of the appliances is good. The fracture fragments are now in near anatomic in alignment. IMPRESSION: ORIF of an intertrochanteric fracture of the left hip. Electronically Signed   By: David  [place] M.D.   On: 07/08/2017 12:22   Dg Femur Min 2 Views Left  Result Date: 07/08/2017 CLINICAL DATA:  Postop left femur. EXAM: LEFT FEMUR 2 VIEWS COMPARISON:  07/08/2017. FINDINGS: ORIF left femur. Hardware intact. Anatomic alignment. Postsurgical changes noted about the soft tissues about the left hip. Surgical staples noted over the left hip. IMPRESSION: ORIF left femur.  Hardware intact.  Anatomic alignment. Electronically Signed   By: Maisie Fus  Register   On: 07/08/2017 13:49   Dg Femur Min 2 Views Left  Result Date: 07/08/2017 CLINICAL DATA:  Status post fall.  Known left hip fracture. EXAM: LEFT FEMUR 2 VIEWS COMPARISON:  AP view  of the pelvis of today's date FINDINGS: Again demonstrated is the comminuted and distracted intertrochanteric fracture of the left hip. There is mild superior migration of the femoral shaft with respect to the base of the femoral neck. The femoral shaft is intact. The observed portions of the left knee are within the limits of normal for age. IMPRESSION: Acute comminuted distracted intertrochanteric fracture of the left hip. Electronically Signed   By: David  [place] M.D.   On: 07/08/2017 07:26    Micro Results     Recent Results (from the past 240 hour(s))  MRSA PCR Screening     Status: None   Collection Time: 07/07/17 11:35 PM  Result Value Ref Range Status    MRSA by PCR NEGATIVE NEGATIVE Final    Comment:        The GeneXpert MRSA Assay (FDA approved for NASAL specimens only), is one component of a comprehensive MRSA colonization surveillance program. It is not intended to diagnose MRSA infection nor to guide or monitor treatment for MRSA infections. Performed at [facility], [address] [place], [place] [address]        Today   Subjective:   Chistine Dematteo today  Is stable for discharge  Objective:   Blood pressure (!) 113/52, pulse 97, temperature 98.5 F (36.9 C), temperature source Oral, resp. rate 18, height  (1.575 m), weight 74.4 kg (164 lb), SpO2 95 %.   Intake/Output Summary (Last 24 hours) at 07/10/2017 1153 Last data filed at 07/10/2017 0609 Gross per 24 hour  Intake 720 ml  Output 750 ml  Net -30 ml    Exam Awake Alert, Oriented x 3, No new F.N deficits, Normal affect [place].AT,PERRAL Supple Neck,No JVD, No cervical lymphadenopathy appriciated.  Symmetrical Chest wall movement, Good air movement bilaterally, CTAB RRR,No Gallops,Rubs or new Murmurs, No Parasternal Heave +ve B.Sounds, Abd Soft, Non tender, No organomegaly appriciated, No rebound -guarding or rigidity. No Cyanosis, Clubbing or edema, No new Rash or bruise  Data Review   CBC w Diff:  Lab Results  Component Value Date   WBC 11.9 (H) 07/10/2017   HGB 8.9 (L) 07/10/2017   HGB 15.4 11/21/2013   HCT 26.3 (L) 07/10/2017   HCT 47.0 11/21/2013   PLT 185 07/10/2017   PLT 248 11/21/2013   LYMPHOPCT 13 02/01/2015   LYMPHOPCT 17.5 11/21/2013   MONOPCT 13 02/01/2015   MONOPCT 12.4 11/21/2013   EOSPCT 2 02/01/2015   EOSPCT 1.2 11/21/2013   BASOPCT 1 02/01/2015   BASOPCT 0.5 11/21/2013    CMP:  Lab Results  Component Value Date   NA 134 (L) 07/10/2017   NA 140 11/21/2013   K 3.5 07/10/2017   K 3.9 11/21/2013   CL 98 (L) 07/10/2017   CL 107 11/21/2013   CO2 27 07/10/2017   CO2 27 11/21/2013   BUN 10 07/10/2017   BUN 9  11/21/2013   CREATININE 0.72 07/10/2017   CREATININE 0.88 11/21/2013   PROT 6.9 02/01/2015   PROT 7.3 08/19/2011   ALBUMIN 3.7 02/01/2015   ALBUMIN 3.8 08/19/2011   BILITOT 0.7 02/01/2015   BILITOT 0.3 08/19/2011   ALKPHOS 82 02/01/2015   ALKPHOS 107 08/19/2011   AST 29 02/01/2015   AST 38 (H) 08/19/2011   ALT 24 02/01/2015   ALT 40 08/19/2011  .   Total Time in preparing paper work, data evaluation and todays exam - 35 minutes  Katha Hamming M.D on 07/10/2017 at 11:53 AM  Note: This dictation was prepared with Dragon dictation along with smaller phrase technology. Any transcriptional errors that result from this process are unintentional.

## 2017-07-10 NOTE — Progress Notes (Signed)
Pt unable to have BM. MD Luberta Mutter notified. Verbal orders to give fleet enema.

## 2017-07-10 NOTE — Progress Notes (Signed)
Report called and given to Kim at [facility]. Suppository given to assist pt in having BM.

## 2017-07-10 NOTE — Progress Notes (Signed)
Subjective:  POD # 2 s/p IM fixation for left intertrochanteric hip fracture.  Patient reports left hip pain as mild.  Patient is eating lunch and up out of bed to a chair.  No complaints.  Objective:   VITALS:   Vitals:   07/09/17 1611 07/09/17 2342 07/10/17 0735 07/10/17 0807  BP: (!) 123/55 (!) 108/53 (!) 113/52   Pulse: 99 92 (!) 102 97  Resp: 18 18    Temp: 98.9 F (37.2 C) 98.6 F (37 C) 98.5 F (36.9 C)   TempSrc: Oral Oral Oral   SpO2: 96% 94%  95%  Weight:      Height:        PHYSICAL EXAM: Left lower extremity: Neurovascular intact Sensation intact distally Intact pulses distally Dorsiflexion/Plantar flexion intact Incision: dressing C/D/I No cellulitis present Compartment soft  LABS  Results for orders placed or performed during the hospital encounter of 07/07/17 (from the past 24 hour(s))  CBC     Status: Abnormal   Collection Time: 07/10/17  4:22 AM  Result Value Ref Range   WBC 11.9 (H) 3.6 - 11.0 K/uL   RBC 2.87 (L) 3.80 - 5.20 MIL/uL   Hemoglobin 8.9 (L) 12.0 - 16.0 g/dL   HCT 16.1 (L) 09.6 - 04.5 %   MCV 91.4 80.0 - 100.0 fL   MCH 31.1 26.0 - 34.0 pg   MCHC 34.1 32.0 - 36.0 g/dL   RDW 40.9 81.1 - 91.4 %   Platelets 185 150 - 440 K/uL  Basic metabolic panel     Status: Abnormal   Collection Time: 07/10/17  4:22 AM  Result Value Ref Range   Sodium 134 (L) 135 - 145 mmol/L   Potassium 3.5 3.5 - 5.1 mmol/L   Chloride 98 (L) 101 - 111 mmol/L   CO2 27 22 - 32 mmol/L   Glucose, Bld 126 (H) 65 - 99 mg/dL   BUN 10 6 - 20 mg/dL   Creatinine, Ser 7.82 0.44 - 1.00 mg/dL   Calcium 7.3 (L) 8.9 - 10.3 mg/dL   GFR calc non Af Amer >60 >60 mL/min   GFR calc Af Amer >60 >60 mL/min   Anion gap 9 5 - 15  Glucose, capillary     Status: Abnormal   Collection Time: 07/10/17  8:06 AM  Result Value Ref Range   Glucose-Capillary 122 (H) 65 - 99 mg/dL   Comment 1 Notify RN     Dg Hand Complete Right  Result Date: 07/09/2017 CLINICAL DATA:  Recent fall  with right hand pain and swelling, initial encounter EXAM: RIGHT HAND - COMPLETE 3+ VIEW COMPARISON:  None. FINDINGS: Degenerative changes are noted most marked in the first MCP joint with marked remodeling of the joint. No acute fracture or dislocation is seen. Interphalangeal degenerative changes are noted as well. IMPRESSION: Degenerative change without acute abnormality. Electronically Signed   By: Alcide Clever M.D.   On: 07/09/2017 09:16   Dg Femur Min 2 Views Left  Result Date: 07/08/2017 CLINICAL DATA:  Postop left femur. EXAM: LEFT FEMUR 2 VIEWS COMPARISON:  07/08/2017. FINDINGS: ORIF left femur. Hardware intact. Anatomic alignment. Postsurgical changes noted about the soft tissues about the left hip. Surgical staples noted over the left hip. IMPRESSION: ORIF left femur.  Hardware intact.  Anatomic alignment. Electronically Signed   By: Maisie Fus  Register   On: 07/08/2017 13:49    Assessment/Plan: 2 Days Post-Op   Principal Problem:   Hip fracture, unspecified laterality, closed, initial encounter (  HCC) Active Problems:   Hip fracture [facility])  Patient is doing well from orthopedic standpoint.  Her hemoglobin is stable today.  She will be discharged to her skilled nursing facility.  She will continue physical therapy for left hip range of motion, lower extremity strengthening and gait training.  Patient is weightbearing as tolerated on left lower extremity.  Will take enteric-coated aspirin 325 mg p.o. twice daily for DVT prophylaxis.  She will follow-up with me in 10 to 14 days in the office for wound check, staple removal and x-ray.    Juanell Fairly , MD 07/10/2017, 1:10 PM

## 2017-07-10 NOTE — Clinical Social Work Placement (Addendum)
   CLINICAL SOCIAL WORK PLACEMENT  NOTE  Date:  07/10/2017  Patient Details  Name: Brittany Hogan MRN: [id number] Date of Birth: 09/22/1935  Clinical Social Work is seeking post-discharge placement for this patient at the Skilled  Nursing Facility level of care (*CSW will initial, date and re-position this form in  chart as items are completed):  Yes   Patient/family provided with [facility] Clinical Social Work Department's list of facilities offering this level of care within the geographic area requested by the patient (or if unable, by the patient's family).  Yes   Patient/family informed of their freedom to choose among providers that offer the needed level of care, that participate in Medicare, Medicaid or managed care program needed by the patient, have an available bed and are willing to accept the patient.  Yes   Patient/family informed of [facility]'s ownership interest in [facility] and [facility], as well as of the fact that they are under no obligation to receive care at these facilities.  PASRR submitted to EDS on       PASRR number received on       Existing PASRR number confirmed on 07/08/17     FL2 transmitted to all facilities in geographic area requested by pt/family on 07/08/17     FL2 transmitted to all facilities within larger geographic area on       Patient informed that his/her managed care company has contracts with or will negotiate with certain facilities, including the following:        Yes   Patient/family informed of bed offers received.  [facility]     Physician recommends and patient chooses bed at      Patient to be transferred to ([facility] on 07/10/17.  Patient to be transferred to facility by [facility] )     Patient family notified on 07/10/17 of transfer.  Name of family member notified:  (CSW left patient's husband [name] a Engineer, technical sales. ) Patient's daughter in law [name] called CSW and was made aware of  D/C today.   PHYSICIAN       Additional Comment:    _______________________________________________ Marcio Hoque, Darleen Crocker, LCSW 07/10/2017, 1:46 PM

## 2017-07-10 NOTE — Progress Notes (Addendum)
Patient is medically stable for D/C to [facility] today. Per Jom[facility] Longs [facility] liaison patient can come today to room 805. RN will call report and arrange EMS for transport. Clinical Child psychotherapist (CSW) sent D/C orders to [facility] via [facility]. Patient is aware of above. CSW left patient's husband [name] a Engineer, technical sales. Please reconsult if future social work needs arise. CSW signing off.   Patient's daughter in law [name] called CSW and was made aware of above.    [employer], LCSW [phone]

## 2017-07-10 NOTE — Progress Notes (Signed)
Physical Therapy Treatment Patient Details Name: Brittany Hogan MRN: [id number] DOB: 1935/06/07 Today's Date: 07/10/2017    History of Present Illness 82 y/o F who presented s/p fall with resultant L intertrochanteric fx, s/p L intramedullary fixation.  PMH includes seizure, stroke, R shoulder surgery.     PT Comments    Deferred mobility/ambulation this afternoon as pt just recently got back into bed with nursing (with much assist and effort) and will likely be discharging to rehab later today.  She c/o pain with most acts but showed good effort with L LE exercises and was eager to do all she could despite the discomfort.   Follow Up Recommendations  SNF     Equipment Recommendations  None recommended by PT    Recommendations for Other Services       Precautions / Restrictions Precautions Precautions: Fall Restrictions LLE Weight Bearing: Weight bearing as tolerated    Mobility  Bed Mobility               General bed mobility comments: deferred mobility this afternoon.  Pt had just recently gotten back into bed with nursing (she and nursing report that she struggled a lot with this)  Transfers                    Ambulation/Gait                 Stairs             Wheelchair Mobility    Modified Rankin (Stroke Patients Only)       Balance                                            Cognition Arousal/Alertness: Awake/alert Behavior During Therapy: WFL for tasks assessed/performed Overall Cognitive Status: Within Functional Limits for tasks assessed                                        Exercises General Exercises - Lower Extremity Ankle Circles/Pumps: Strengthening;15 reps Quad Sets: Strengthening;15 reps Gluteal Sets: Strengthening;15 reps Short Arc Quad: AROM;15 reps Heel Slides: AROM;AAROM;15 reps Hip ABduction/ADduction: AROM;15 reps    General Comments        Pertinent Vitals/Pain  Pain Assessment: 0-10 Pain Score: 8  Pain Location: L hip    Home Living                      Prior Function            PT Goals (current goals can now be found in the care plan section)      Frequency    BID      PT Plan Current plan remains appropriate    Co-evaluation              AM-PAC PT "6 Clicks" Daily Activity  Outcome Measure  Difficulty turning over in bed (including adjusting bedclothes, sheets and blankets)?: Unable Difficulty moving from lying on back to sitting on the side of the bed? : Unable Difficulty sitting down on and standing up from a chair with arms (e.g., wheelchair, bedside commode, etc,.)?: Unable Help needed moving to and from a bed to chair (including a wheelchair)?: A Lot Help needed walking in hospital room?: A  Lot Help needed climbing 3-5 steps with a railing? : Total 6 Click Score: 8    End of Session   Activity Tolerance: Patient limited by pain Patient left: with bed alarm set;with call bell/phone within reach   PT Visit Diagnosis: Pain;Unsteadiness on feet (R26.81);Other abnormalities of gait and mobility (R26.89);History of falling (Z91.81);Muscle weakness (generalized) (M62.81);Difficulty in walking, not elsewhere classified (R26.2) Pain - Right/Left: Left Pain - part of body: Hip     Time: 7829-5621 PT Time Calculation (min) (ACUTE ONLY): 12 min  Charges:  $Therapeutic Exercise: 8-22 mins                    G Codes:       Malachi Pro, DPT 07/10/2017, 2:35 PM

## 2017-07-10 NOTE — Care Management Important Message (Signed)
Important Message  Patient Details  Name: MALIAKA BRASINGTON MRN: [id number] Date of Birth: December 19, 1935   Medicare Important Message Given:  Yes    Olegario Messier A Hisae Decoursey 07/10/2017, 10:42 AM

## 2017-07-15 ENCOUNTER — Emergency Department: Payer: Medicare HMO

## 2017-07-15 ENCOUNTER — Inpatient Hospital Stay
Admission: EM | Admit: 2017-07-15 | Discharge: 2017-07-21 | DRG: 175 | Disposition: A | Discharge status: Skilled Nursing Facility | Payer: Medicare HMO | Source: Skilled Nursing Facility | Attending: Internal Medicine | Admitting: Internal Medicine

## 2017-07-15 ENCOUNTER — Other Ambulatory Visit: Payer: Self-pay

## 2017-07-15 ENCOUNTER — Inpatient Hospital Stay: Payer: Medicare HMO

## 2017-07-15 ENCOUNTER — Encounter: Payer: Self-pay | Admitting: Emergency Medicine

## 2017-07-15 DIAGNOSIS — J9601 Acute respiratory failure with hypoxia: Secondary | ICD-10-CM | POA: Diagnosis not present

## 2017-07-15 DIAGNOSIS — I82431 Acute embolism and thrombosis of right popliteal vein: Secondary | ICD-10-CM | POA: Diagnosis present

## 2017-07-15 DIAGNOSIS — F05 Delirium due to known physiological condition: Secondary | ICD-10-CM | POA: Diagnosis present

## 2017-07-15 DIAGNOSIS — K5900 Constipation, unspecified: Secondary | ICD-10-CM | POA: Diagnosis present

## 2017-07-15 DIAGNOSIS — I2609 Other pulmonary embolism with acute cor pulmonale: Principal | ICD-10-CM | POA: Diagnosis present

## 2017-07-15 DIAGNOSIS — I2699 Other pulmonary embolism without acute cor pulmonale: Secondary | ICD-10-CM

## 2017-07-15 DIAGNOSIS — D72829 Elevated white blood cell count, unspecified: Secondary | ICD-10-CM | POA: Diagnosis present

## 2017-07-15 DIAGNOSIS — I248 Other forms of acute ischemic heart disease: Secondary | ICD-10-CM | POA: Diagnosis present

## 2017-07-15 DIAGNOSIS — E871 Hypo-osmolality and hyponatremia: Secondary | ICD-10-CM | POA: Diagnosis present

## 2017-07-15 DIAGNOSIS — G40909 Epilepsy, unspecified, not intractable, without status epilepticus: Secondary | ICD-10-CM | POA: Diagnosis present

## 2017-07-15 DIAGNOSIS — Z96642 Presence of left artificial hip joint: Secondary | ICD-10-CM | POA: Diagnosis present

## 2017-07-15 DIAGNOSIS — J9690 Respiratory failure, unspecified, unspecified whether with hypoxia or hypercapnia: Secondary | ICD-10-CM | POA: Diagnosis present

## 2017-07-15 DIAGNOSIS — W19XXXD Unspecified fall, subsequent encounter: Secondary | ICD-10-CM

## 2017-07-15 DIAGNOSIS — G92 Toxic encephalopathy: Secondary | ICD-10-CM | POA: Diagnosis present

## 2017-07-15 DIAGNOSIS — E039 Hypothyroidism, unspecified: Secondary | ICD-10-CM | POA: Diagnosis present

## 2017-07-15 DIAGNOSIS — Z7982 Long term (current) use of aspirin: Secondary | ICD-10-CM

## 2017-07-15 DIAGNOSIS — T423X5A Adverse effect of barbiturates, initial encounter: Secondary | ICD-10-CM | POA: Diagnosis present

## 2017-07-15 DIAGNOSIS — F329 Major depressive disorder, single episode, unspecified: Secondary | ICD-10-CM | POA: Diagnosis present

## 2017-07-15 DIAGNOSIS — F0390 Unspecified dementia without behavioral disturbance: Secondary | ICD-10-CM | POA: Diagnosis present

## 2017-07-15 DIAGNOSIS — I824Z1 Acute embolism and thrombosis of unspecified deep veins of right distal lower extremity: Secondary | ICD-10-CM | POA: Diagnosis present

## 2017-07-15 DIAGNOSIS — I82409 Acute embolism and thrombosis of unspecified deep veins of unspecified lower extremity: Secondary | ICD-10-CM

## 2017-07-15 DIAGNOSIS — Z79890 Hormone replacement therapy (postmenopausal): Secondary | ICD-10-CM | POA: Diagnosis not present

## 2017-07-15 DIAGNOSIS — G9341 Metabolic encephalopathy: Secondary | ICD-10-CM | POA: Diagnosis not present

## 2017-07-15 DIAGNOSIS — S72002D Fracture of unspecified part of neck of left femur, subsequent encounter for closed fracture with routine healing: Secondary | ICD-10-CM | POA: Diagnosis not present

## 2017-07-15 DIAGNOSIS — I361 Nonrheumatic tricuspid (valve) insufficiency: Secondary | ICD-10-CM | POA: Diagnosis not present

## 2017-07-15 DIAGNOSIS — R410 Disorientation, unspecified: Secondary | ICD-10-CM | POA: Diagnosis not present

## 2017-07-15 LAB — BLOOD GAS, VENOUS
Acid-Base Excess: 2.8 mmol/L — ABNORMAL HIGH (ref 0.0–2.0)
Bicarbonate: 27.2 mmol/L (ref 20.0–28.0)
O2 Saturation: UNDETERMINED %
PH VEN: 7.44 — AB (ref 7.250–7.430)
Patient temperature: 37
pCO2, Ven: 40 mmHg — ABNORMAL LOW (ref 44.0–60.0)

## 2017-07-15 LAB — URINALYSIS, ROUTINE W REFLEX MICROSCOPIC
BILIRUBIN URINE: NEGATIVE
GLUCOSE, UA: NEGATIVE mg/dL
HGB URINE DIPSTICK: NEGATIVE
Ketones, ur: NEGATIVE mg/dL
Leukocytes, UA: NEGATIVE
Nitrite: NEGATIVE
PROTEIN: NEGATIVE mg/dL
Specific Gravity, Urine: 1.014 (ref 1.005–1.030)
pH: 6 (ref 5.0–8.0)

## 2017-07-15 LAB — COMPREHENSIVE METABOLIC PANEL
ALK PHOS: 102 U/L (ref 38–126)
ALT: 56 U/L — ABNORMAL HIGH (ref 14–54)
AST: 62 U/L — ABNORMAL HIGH (ref 15–41)
Albumin: 3.1 g/dL — ABNORMAL LOW (ref 3.5–5.0)
Anion gap: 14 (ref 5–15)
BUN: 28 mg/dL — ABNORMAL HIGH (ref 6–20)
CALCIUM: 7.7 mg/dL — AB (ref 8.9–10.3)
CHLORIDE: 92 mmol/L — AB (ref 101–111)
CO2: 23 mmol/L (ref 22–32)
CREATININE: 1 mg/dL (ref 0.44–1.00)
GFR, EST NON AFRICAN AMERICAN: 52 mL/min — AB (ref >60)
Glucose, Bld: 136 mg/dL — ABNORMAL HIGH (ref 65–99)
Potassium: 4.1 mmol/L (ref 3.5–5.1)
Sodium: 129 mmol/L — ABNORMAL LOW (ref 135–145)
Total Bilirubin: 1.5 mg/dL — ABNORMAL HIGH (ref 0.3–1.2)
Total Protein: 6.6 g/dL (ref 6.5–8.1)

## 2017-07-15 LAB — GLUCOSE, CAPILLARY: Glucose-Capillary: 124 mg/dL — ABNORMAL HIGH (ref 65–99)

## 2017-07-15 LAB — CBC WITH DIFFERENTIAL/PLATELET
Basophils Absolute: 0.1 10*3/uL (ref 0–0.1)
Basophils Relative: 1 %
EOS PCT: 0 %
Eosinophils Absolute: 0.1 10*3/uL (ref 0–0.7)
HCT: 26.1 % — ABNORMAL LOW (ref 35.0–47.0)
Hemoglobin: 8.8 g/dL — ABNORMAL LOW (ref 12.0–16.0)
LYMPHS ABS: 1.3 10*3/uL (ref 1.0–3.6)
LYMPHS PCT: 8 %
MCH: 31.4 pg (ref 26.0–34.0)
MCHC: 33.6 g/dL (ref 32.0–36.0)
MCV: 93.4 fL (ref 80.0–100.0)
MONOS PCT: 11 %
Monocytes Absolute: 1.8 10*3/uL — ABNORMAL HIGH (ref 0.2–0.9)
Neutro Abs: 13.4 10*3/uL — ABNORMAL HIGH (ref 1.4–6.5)
Neutrophils Relative %: 80 %
PLATELETS: 296 10*3/uL (ref 150–440)
RBC: 2.8 MIL/uL — ABNORMAL LOW (ref 3.80–5.20)
RDW: 14.2 % (ref 11.5–14.5)
WBC: 16.7 10*3/uL — AB (ref 3.6–11.0)

## 2017-07-15 LAB — CBC
HCT: 25.5 % — ABNORMAL LOW (ref 35.0–47.0)
Hemoglobin: 8.6 g/dL — ABNORMAL LOW (ref 12.0–16.0)
MCH: 31.5 pg (ref 26.0–34.0)
MCHC: 33.9 g/dL (ref 32.0–36.0)
MCV: 93 fL (ref 80.0–100.0)
Platelets: 289 10*3/uL (ref 150–440)
RBC: 2.74 MIL/uL — ABNORMAL LOW (ref 3.80–5.20)
RDW: 14.1 % (ref 11.5–14.5)
WBC: 17.7 10*3/uL — ABNORMAL HIGH (ref 3.6–11.0)

## 2017-07-15 LAB — TROPONIN I
Troponin I: 1.41 ng/mL (ref <0.03)
Troponin I: 2.18 ng/mL (ref <0.03)

## 2017-07-15 LAB — APTT: aPTT: 109 seconds — ABNORMAL HIGH (ref 24–36)

## 2017-07-15 LAB — PROTIME-INR
INR: 1.35
Prothrombin Time: 16.6 seconds — ABNORMAL HIGH (ref 11.4–15.2)

## 2017-07-15 LAB — MRSA PCR SCREENING: MRSA by PCR: NEGATIVE

## 2017-07-15 LAB — PROCALCITONIN

## 2017-07-15 LAB — BRAIN NATRIURETIC PEPTIDE: B NATRIURETIC PEPTIDE 5: 1221 pg/mL — AB (ref 0.0–100.0)

## 2017-07-15 LAB — LACTIC ACID, PLASMA: LACTIC ACID, VENOUS: 2 mmol/L — AB (ref 0.5–1.9)

## 2017-07-15 MED ORDER — IOPAMIDOL (ISOVUE-370) INJECTION 76%
75.0000 mL | Freq: Once | INTRAVENOUS | Status: AC | PRN
  Administered 2017-07-15: 75 mL via INTRAVENOUS

## 2017-07-15 MED ORDER — PHENYTOIN SODIUM EXTENDED 100 MG PO CAPS
100.0000 mg | ORAL_CAPSULE | Freq: Three times a day (TID) | ORAL | Status: DC
  Administered 2017-07-15 – 2017-07-21 (×16): 100 mg via ORAL
  Filled 2017-07-15 (×21): qty 1

## 2017-07-15 MED ORDER — SODIUM CHLORIDE 0.9 % IV SOLN
Freq: Once | INTRAVENOUS | Status: AC
  Administered 2017-07-15 (×2): via INTRAVENOUS

## 2017-07-15 MED ORDER — VANCOMYCIN HCL IN DEXTROSE 1-5 GM/200ML-% IV SOLN
1000.0000 mg | Freq: Once | INTRAVENOUS | Status: AC
  Administered 2017-07-15: 1000 mg via INTRAVENOUS
  Filled 2017-07-15: qty 200

## 2017-07-15 MED ORDER — ONDANSETRON HCL 4 MG PO TABS: 4.0000 mg | ORAL_TABLET | Freq: Four times a day (QID) | ORAL | Status: DC | PRN

## 2017-07-15 MED ORDER — LEVOTHYROXINE SODIUM 50 MCG PO TABS: 25.0000 ug | ORAL_TABLET | Freq: Every day | ORAL | Status: DC

## 2017-07-15 MED ORDER — LEVOTHYROXINE SODIUM 25 MCG PO TABS
25.0000 ug | ORAL_TABLET | Freq: Every day | ORAL | Status: DC
  Administered 2017-07-16 – 2017-07-21 (×6): 25 ug via ORAL
  Filled 2017-07-15 (×6): qty 1

## 2017-07-15 MED ORDER — HEPARIN SODIUM (PORCINE) 5000 UNIT/ML IJ SOLN
4000.0000 [IU] | Freq: Once | INTRAMUSCULAR | Status: AC
  Administered 2017-07-15: 4000 [IU] via INTRAVENOUS
  Filled 2017-07-15: qty 1

## 2017-07-15 MED ORDER — IPRATROPIUM-ALBUTEROL 0.5-2.5 (3) MG/3ML IN SOLN
3.0000 mL | Freq: Once | RESPIRATORY_TRACT | Status: AC
  Administered 2017-07-15: 3 mL via RESPIRATORY_TRACT
  Filled 2017-07-15: qty 3

## 2017-07-15 MED ORDER — HEPARIN BOLUS VIA INFUSION
4000.0000 [IU] | Freq: Once | INTRAVENOUS | Status: DC
  Filled 2017-07-15: qty 4000

## 2017-07-15 MED ORDER — PHENOBARBITAL 32.4 MG PO TABS
97.2000 mg | ORAL_TABLET | Freq: Every evening | ORAL | Status: DC
  Administered 2017-07-15 – 2017-07-16 (×2): 97.2 mg via ORAL
  Filled 2017-07-15 (×2): qty 3

## 2017-07-15 MED ORDER — VANCOMYCIN HCL IN DEXTROSE 1-5 GM/200ML-% IV SOLN: 1000.0000 mg | INTRAVENOUS | Status: DC

## 2017-07-15 MED ORDER — POLYETHYLENE GLYCOL 3350 17 G PO PACK: 17.0000 g | PACK | Freq: Every day | ORAL | Status: DC | PRN

## 2017-07-15 MED ORDER — SODIUM CHLORIDE 0.9% FLUSH: 3.0000 mL | INTRAVENOUS | Status: DC | PRN

## 2017-07-15 MED ORDER — PANTOPRAZOLE SODIUM 40 MG PO TBEC
40.0000 mg | DELAYED_RELEASE_TABLET | Freq: Every day | ORAL | Status: DC
  Administered 2017-07-15 – 2017-07-21 (×7): 40 mg via ORAL
  Filled 2017-07-15 (×6): qty 1

## 2017-07-15 MED ORDER — CITALOPRAM HYDROBROMIDE 20 MG PO TABS
20.0000 mg | ORAL_TABLET | Freq: Every day | ORAL | Status: DC
  Administered 2017-07-16 – 2017-07-21 (×6): 20 mg via ORAL
  Filled 2017-07-15 (×6): qty 1

## 2017-07-15 MED ORDER — HEPARIN (PORCINE) IN NACL 100-0.45 UNIT/ML-% IJ SOLN
1250.0000 [IU]/h | INTRAMUSCULAR | Status: DC
  Administered 2017-07-15: 1100 [IU]/h via INTRAVENOUS
  Administered 2017-07-16: 1250 [IU]/h via INTRAVENOUS
  Filled 2017-07-15 (×2): qty 250

## 2017-07-15 MED ORDER — ACETAMINOPHEN 650 MG RE SUPP
650.0000 mg | Freq: Four times a day (QID) | RECTAL | Status: DC | PRN
Start: 2017-07-15 — End: 2017-07-21

## 2017-07-15 MED ORDER — ACETAMINOPHEN 325 MG PO TABS
650.0000 mg | ORAL_TABLET | Freq: Four times a day (QID) | ORAL | Status: DC | PRN
  Administered 2017-07-18: 650 mg via ORAL
  Filled 2017-07-15: qty 2

## 2017-07-15 MED ORDER — SODIUM CHLORIDE 0.9 % IV SOLN: 2.0000 g | Freq: Two times a day (BID) | INTRAVENOUS | Status: DC

## 2017-07-15 MED ORDER — SODIUM CHLORIDE 0.9% FLUSH
3.0000 mL | Freq: Two times a day (BID) | INTRAVENOUS | Status: DC
  Administered 2017-07-15 – 2017-07-21 (×12): 3 mL via INTRAVENOUS

## 2017-07-15 MED ORDER — SODIUM CHLORIDE 0.9 % IV SOLN
2.0000 g | Freq: Once | INTRAVENOUS | Status: AC
  Administered 2017-07-15: 2 g via INTRAVENOUS
  Filled 2017-07-15: qty 2

## 2017-07-15 MED ORDER — ONDANSETRON HCL 4 MG/2ML IJ SOLN: 4.0000 mg | Freq: Four times a day (QID) | INTRAMUSCULAR | Status: DC | PRN

## 2017-07-15 MED ORDER — PHENOBARBITAL 32.4 MG PO TABS
64.8000 mg | ORAL_TABLET | Freq: Every day | ORAL | Status: DC
  Administered 2017-07-16 – 2017-07-18 (×3): 64.8 mg via ORAL
  Filled 2017-07-15 (×3): qty 2

## 2017-07-15 MED ORDER — SODIUM CHLORIDE 0.9 % IV SOLN: 250.0000 mL | INTRAVENOUS | Status: DC | PRN

## 2017-07-15 NOTE — ED Notes (Signed)
[name] family concerned that pt has not voided. Pt states she has no urge to void. Bladder scanner performed and showed >[id number]. Dr. Fanny BienQuale notified he asked to place pt on bedpan and let her try to void. Pt was unable to void. Dr. Tobi BastosPyreddy notified and he stated to perform a one time in and out catheterization. Dr. Sung AmabileSimonds notified.

## 2017-07-15 NOTE — ED Provider Notes (Addendum)
[facility] Emergency Department Provider Note    First MD Initiated Contact with Patient 07/15/17 1341     (approximate)  I have reviewed the triage vital signs and the nursing notes.   HISTORY  Chief Complaint Respiratory Distress    HPI JESTINE BICKNELL is a 82 y.o. female presents from [facility] with chief complaint of respiratory distress.  States occurred reportedly around 1:00 this afternoon.  Patient found to be presently hypoxic on room air and patient typically does not wear home oxygen.  Was found to be 80% on 2 L nasal cannula requiring titration up but unable to get SPO2 greater than 95%.   Patient is recently status post left hip surgery.  Not reportedly on any anticoagulation.  Does report some mild non radtiating pain with shortness of breath.  No measured fevers.   Past Medical History:  Diagnosis Date  . Seizures (HCC)   . Stroke [facility])    History reviewed. No pertinent family history. Past Surgical History:  Procedure Laterality Date  . APPENDECTOMY    . BREAST BIOPSY Right    neg  . CHOLECYSTECTOMY N/A 09/27/2014   Procedure: LAPAROSCOPIC CHOLECYSTECTOMY with cholangiogram;  Surgeon: Kieth Brightly, MD;  Location: [facility] ORS;  Service: General;  Laterality: N/A;  . INTRAMEDULLARY (IM) NAIL INTERTROCHANTERIC Left 07/08/2017   Procedure: INTRAMEDULLARY (IM) NAIL INTERTROCHANTRIC;  Surgeon: Juanell Fairly, MD;  Location: [facility] ORS;  Service: Orthopedics;  Laterality: Left;  . SHOULDER SURGERY Right    Patient Active Problem List   Diagnosis Date Noted  . Respiratory failure (HCC) 07/15/2017  . Hip fracture, unspecified laterality, closed, initial encounter (HCC) 07/07/2017  . Hip fracture (HCC) 07/07/2017  . Lower abdominal pain 10/29/2016  . Vaginal lesion 10/29/2016      Prior to Admission medications   Medication Sig Start Date End Date Taking? Authorizing Provider  aspirin EC 325 MG tablet Take 1 tablet (325 mg  total) by mouth daily. As per Dr.Krasinski patient will be on aspirin 325 mg p.o. twice daily for 1 month after that patient can resume her home dose. Patient taking differently: Take 325 mg by mouth 2 (two) times daily.  07/10/17  Yes Katha Hamming, MD  citalopram (CELEXA) 20 MG tablet Take 20 mg by mouth daily. 06/30/17  Yes [provider]  hydrochlorothiazide (HYDRODIURIL) 25 MG tablet Take 25 mg by mouth daily.  10/14/16  Yes [provider]  levothyroxine (SYNTHROID, LEVOTHROID) 25 MCG tablet Take 25 mcg by mouth daily. 06/16/17  Yes [provider]  nitrofurantoin, macrocrystal-monohydrate, (MACROBID) 100 MG capsule Take 100 mg by mouth 2 (two) times daily. 07/14/17 07/19/17 Yes [provider]  pantoprazole (PROTONIX) 40 MG tablet Take 40 mg by mouth daily.  10/21/16  Yes [provider]  PHENobarbital (LUMINAL) 64.8 MG tablet Take 64.8-97.2 tablets by mouth See admin instructions. 64.8 mg every morning and 97.2 every evening   Yes [provider]  phenytoin (DILANTIN) 100 MG ER capsule Take 100 mg by mouth 3 (three) times daily.   Yes [provider]  polyethylene glycol (MIRALAX) packet Take 17 g by mouth daily as needed for mild constipation or moderate constipation. Patient taking differently: Take 17 g by mouth daily.  01/30/15  Yes Schaevitz, Myra Rude, MD  HYDROcodone-acetaminophen (NORCO/VICODIN) 5-325 MG tablet Take 1-2 tablets by mouth every 4 (four) hours as needed for moderate pain. Patient not taking: Reported on 07/15/2017 07/10/17   Katha Hamming, MD  polyethylene glycol-electrolytes (  TRILYTE) 420 G solution Take 4,000 mLs by mouth once. Patient not taking: Reported on 07/08/2017 02/01/15   Emily Filbert, MD    Allergies Patient has no known allergies.    Social History Social History   Tobacco Use  . Smoking status: Never Smoker  . Smokeless tobacco: Never Used  Substance Use Topics  .  Alcohol use: No    Alcohol/week: 0.0 oz  . Drug use: No    Review of Systems Patient denies headaches, rhinorrhea, blurry vision, numbness, shortness of breath, chest pain, edema, cough, abdominal pain, nausea, vomiting, diarrhea, dysuria, fevers, rashes or hallucinations unless otherwise stated above in HPI. ____________________________________________   PHYSICAL EXAM:  VITAL SIGNS: Vitals:   07/15/17 1430 07/15/17 1500  BP: 127/70 120/82  Pulse:    Resp: (!) 22 (!) 21  Temp:    SpO2:      Constitutional: Alert disoriented in moderate respiratory distress Eyes: Conjunctivae are normal.  Head: Atraumatic. Nose: No congestion/rhinnorhea. Mouth/Throat: Mucous membranes are moist.   Neck: No stridor. Painless ROM.  Cardiovascular: Normal rate, regular rhythm. Grossly normal heart sounds.  Good peripheral circulation. Respiratory: Normal respiratory effort.  No retractions. Lungs CTAB. Gastrointestinal: Soft and nontender. No distention. No abdominal bruits. No CVA tenderness. Genitourinary: deferred Musculoskeletal: No lower extremity tenderness 2+ BLE edema.  No joint effusions. Neurologic:  Normal speech and language. No gross focal neurologic deficits are appreciated. No facial droop Skin:  Skin is warm, dry and intact. No rash noted. Psychiatric: Mood and affect are normal. Speech and behavior are normal.  ____________________________________________   LABS (all labs ordered are listed, but only abnormal results are displayed)  Results for orders placed or performed during the hospital encounter of 07/15/17 (from the past 24 hour(s))  CBC with Differential/Platelet     Status: Abnormal   Collection Time: 07/15/17  1:49 PM  Result Value Ref Range   WBC 16.7 (H) 3.6 - 11.0 K/uL   RBC 2.80 (L) 3.80 - 5.20 MIL/uL   Hemoglobin 8.8 (L) 12.0 - 16.0 g/dL   HCT 16.1 (L) 09.6 - 04.5 %   MCV 93.4 80.0 - 100.0 fL   MCH 31.4 26.0 - 34.0 pg   MCHC 33.6 32.0 - 36.0 g/dL   RDW  40.9 81.1 - 91.4 %   Platelets 296 150 - 440 K/uL   Neutrophils Relative % 80 %   Neutro Abs 13.4 (H) 1.4 - 6.5 K/uL   Lymphocytes Relative 8 %   Lymphs Abs 1.3 1.0 - 3.6 K/uL   Monocytes Relative 11 %   Monocytes Absolute 1.8 (H) 0.2 - 0.9 K/uL   Eosinophils Relative 0 %   Eosinophils Absolute 0.1 0 - 0.7 K/uL   Basophils Relative 1 %   Basophils Absolute 0.1 0 - 0.1 K/uL  Comprehensive metabolic panel     Status: Abnormal   Collection Time: 07/15/17  1:49 PM  Result Value Ref Range   Sodium 129 (L) 135 - 145 mmol/L   Potassium 4.1 3.5 - 5.1 mmol/L   Chloride 92 (L) 101 - 111 mmol/L   CO2 23 22 - 32 mmol/L   Glucose, Bld 136 (H) 65 - 99 mg/dL   BUN 28 (H) 6 - 20 mg/dL   Creatinine, Ser 7.82 0.44 - 1.00 mg/dL   Calcium 7.7 (L) 8.9 - 10.3 mg/dL   Total Protein 6.6 6.5 - 8.1 g/dL   Albumin 3.1 (L) 3.5 - 5.0 g/dL   AST 62 (H) 15 - 41  U/L   ALT 56 (H) 14 - 54 U/L   Alkaline Phosphatase 102 38 - 126 U/L   Total Bilirubin 1.5 (H) 0.3 - 1.2 mg/dL   GFR calc non Af Amer 52 (L) >60 mL/min   GFR calc Af Amer >60 >60 mL/min   Anion gap 14 5 - 15  Blood gas, venous     Status: Abnormal   Collection Time: 07/15/17  1:49 PM  Result Value Ref Range   pH, Ven 7.44 (H) 7.250 - 7.430   pCO2, Ven 40 (L) 44.0 - 60.0 mmHg   Bicarbonate 27.2 20.0 - 28.0 mmol/L   Acid-Base Excess 2.8 (H) 0.0 - 2.0 mmol/L   O2 Saturation UNABLE TO CALCULATE. %   Patient temperature 37.0    Collection site VEIN    Sample type VENIPUNCTURE    ____________________________________________  EKG My review and personal interpretation at Time: 13:40   Indication: resp distress  Rate: 90  Rhythm: sinus Axis: normal Other: st depressions inferolaterally, no stemi criteria ____________________________________________  RADIOLOGY  I personally reviewed all radiographic images ordered to evaluate for the above acute complaints and reviewed radiology reports and findings.  These findings were personally discussed with  the patient.  Please see medical record for radiology report.  ____________________________________________   PROCEDURES  Procedure(s) performed:  .Critical Care Performed by: Willy Eddyobinson, Yeng Frankie, MD Authorized by: Willy Eddyobinson, Shyasia Funches, MD   Critical care provider statement:    Critical care time (minutes):  35   Critical care time was exclusive of:  Separately billable procedures and treating other patients   Critical care was necessary to treat or prevent imminent or life-threatening deterioration of the following conditions:  Respiratory failure   Critical care was time spent personally by me on the following activities:  Development of treatment plan with patient or surrogate, discussions with consultants, evaluation of patient's response to treatment, examination of patient, obtaining history from patient or surrogate, ordering and performing treatments and interventions, ordering and review of laboratory studies, ordering and review of radiographic studies, pulse oximetry, re-evaluation of patient's condition and review of old charts      Critical Care performed: yes  ____________________________________________   INITIAL IMPRESSION / ASSESSMENT AND PLAN / ED COURSE  Pertinent labs & imaging results that were available during my care of the patient were reviewed by me and considered in my medical decision making (see chart for details).   DDX: Asthma, copd, CHF, pna, ptx, malignancy, Pe, anemia   Cherrie GauzeCarolyn S Labell is a 82 y.o. who presents to the ED with respiratory distress as described above.  Patient is afebrile.  Does have lower extremity swelling left greater than right.  May be a component of congestive heart failure therefore patient placed on BiPAP for her acute hypoxic respiratory distress.  Chest x-ray shows no consolidation.  Patient will require CT angiogram to evaluate and further risk stratify for PE.  Clinical Course as of Jul 16 1555  Tue Jul 15, 2017  1457 Patient  reassessed and is improving on BiPAP.   [PR]  1544 Patient also noted to have hyponatremia 129 with increasing renal insufficiency.  White count may be secondary to postoperative state but given her respiratory distress will obtain blood cultures and give broad-spectrum antibiotics.  My wet read of the CT angiogram I do not see evidence of large pulmonary embolism.  Possible posterior pneumonia versus atelectasis or edema.  We will continue with current therapies.  Do not feel that large volume resuscitation indicated  at this time and patient can slowly receive IV fluids during her admission.  Have discussed with the patient and available family all diagnostics and treatments performed thus far and all questions were answered to the best of my ability. The patient demonstrates understanding and agreement with plan.    [PR]    Clinical Course User Index [PR] Willy Eddy, MD     As part of my medical decision making, I reviewed the following data within the electronic MEDICAL RECORD NUMBER Nursing notes reviewed and incorporated, Labs reviewed, notes from prior ED visits.  ____________________________________________   FINAL CLINICAL IMPRESSION(S) / ED DIAGNOSES  Final diagnoses:  Acute respiratory failure with hypoxia (HCC)  Other acute pulmonary embolism with acute cor pulmonale (HCC)      NEW MEDICATIONS STARTED DURING THIS VISIT:  New Prescriptions   No medications on file     Note:  This document was prepared using Dragon voice recognition software and may include unintentional dictation errors.    Willy Eddy, MD 07/15/17 1545    Willy Eddy, MD 07/15/17 [phone]

## 2017-07-15 NOTE — ED Notes (Signed)
Patient transported to CT 

## 2017-07-15 NOTE — Progress Notes (Signed)
ANTICOAGULATION CONSULT NOTE   Pharmacy Consult for heparin Indication: pulmonary embolus  No Known Allergies  Patient Measurements: Height: 5\' 2"  (157.5 cm) Weight: 164 lb (74.4 kg) IBW/kg (Calculated) : 50.1 Heparin Dosing Weight: 66kg  Vital Signs: Temp: 97.8 F (36.6 C) (06/04 1348) Temp Source: Oral (06/04 1348) BP: 122/75 (06/04 1600) Pulse Rate: 83 (06/04 1600)  Labs: Recent Labs    07/15/17 1349  HGB 8.8*  HCT 26.1*  PLT 296  CREATININE 1.00    Estimated Creatinine Clearance: 43.1 mL/min (by C-G formula based on SCr of 1 mg/dL).   Medical History: Past Medical History:  Diagnosis Date  . Seizures (HCC)   . Stroke [facility](HCC)     Assessment: 82yo female with acute PE on no anticoagulants PTA  Goal of Therapy:  Heparin level 0.3-0.7 units/ml Monitor platelets by anticoagulation protocol: Yes   Plan:  Give 4000 units bolus x 1 Start heparin infusion at 1100 units/hr Check anti-Xa level in 8 hours and daily while on heparin  Lowella Bandyodney D Bethsaida Siegenthaler, PharmD 07/15/2017,4:15 PM

## 2017-07-15 NOTE — Consult Note (Signed)
PULMONARY CONSULT NOTE  Requesting MD/Service: Hospitalist service Date of initial consultation: 07/15/17 Reason for consultation: Acute pulmonary embolism  PT PROFILE: 82 y.o. female admitted via ED from rehab facility with acute PE after recent hip surgery  DATA:  CT chest 06/04: Relatively small, acute pulmonary embolism in right lower lobe pulmonary artery.  RV/LV ratio 1.7   HPI:  As above.  She underwent left hip ORIF for hip fracture.  She was discharged to the rehab facility on aspirin for DVT prophylaxis.  She presented to [facility] ED with acute onset of shortness of breath and hypoxemia.  She was transiently on BiPAP in the emergency department.  At the time of this evaluation, comfortable on  O2.  Past Medical History:  Diagnosis Date  . Seizures (HCC)   . Stroke [facility](HCC)     Past Surgical History:  Procedure Laterality Date  . APPENDECTOMY    . BREAST BIOPSY Right    neg  . CHOLECYSTECTOMY N/A 09/27/2014   Procedure: LAPAROSCOPIC CHOLECYSTECTOMY with cholangiogram;  Surgeon: Kieth BrightlySeeplaputhur G Sankar, MD;  Location: [facility] ORS;  Service: General;  Laterality: N/A;  . INTRAMEDULLARY (IM) NAIL INTERTROCHANTERIC Left 07/08/2017   Procedure: INTRAMEDULLARY (IM) NAIL INTERTROCHANTRIC;  Surgeon: Juanell FairlyKrasinski, Kevin, MD;  Location: [facility] ORS;  Service: Orthopedics;  Laterality: Left;  . SHOULDER SURGERY Right     MEDICATIONS: I have reviewed all medications and confirmed regimen as documented  Social History   Socioeconomic History  . Marital status: Married    Spouse name: Not on file  . Number of children: Not on file  . Years of education: Not on file  . Highest education level: Not on file  Occupational History  . Not on file  Social Needs  . Financial resource strain: Not on file  . Food insecurity:    Worry: Not on file    Inability: Not on file  . Transportation needs:    Medical: Not on file    Non-medical: Not on file  Tobacco Use  . Smoking status: Never Smoker  .  Smokeless tobacco: Never Used  Substance and Sexual Activity  . Alcohol use: No    Alcohol/week: 0.0 oz  . Drug use: No  . Sexual activity: Not on file  Lifestyle  . Physical activity:    Days per week: Not on file    Minutes per session: Not on file  . Stress: Not on file  Relationships  . Social connections:    Talks on phone: Not on file    Gets together: Not on file    Attends religious service: Not on file    Active member of club or organization: Not on file    Attends meetings of clubs or organizations: Not on file    Relationship status: Not on file  . Intimate partner violence:    Fear of current or ex partner: Not on file    Emotionally abused: Not on file    Physically abused: Not on file    Forced sexual activity: Not on file  Other Topics Concern  . Not on file  Social History Narrative  . Not on file    History reviewed. No pertinent family history.  ROS: No fever, myalgias/arthralgias, unexplained weight loss or weight gain No new focal weakness or sensory deficits No otalgia, hearing loss, visual changes, nasal and sinus symptoms, mouth and throat problems No neck pain or adenopathy No abdominal pain, N/V/D, diarrhea, change in bowel pattern No dysuria, change in urinary pattern  Vitals:   07/15/17 1500 07/15/17 1530 07/15/17 1600 07/15/17 1630  BP: 120/82 (!) 111/58 122/75 120/70  Pulse:  86 83 84  Resp: (!) 21 (!) 24 (!) 25 (!) 23  Temp:      TempSrc:      SpO2:  95% 100% 100%  Weight:      Height:         EXAM:  Gen: Somewhat frail-appearing, very HOH HEENT: NCAT, sclera white, oropharynx normal Neck: Supple without LAN, thyromegaly.  No definite JVD Lungs: BS full, slightly coarse, no wheezes Cardiovascular: RRR, no murmurs noted Abdomen: Soft, nontender, normal BS Ext: without clubbing, cyanosis.  Symmetric brawny LE edema Neuro: CNs grossly intact, motor and sensory intact Skin: Limited exam, no lesions noted  DATA:   BMP  Latest Ref Rng & Units 07/15/2017 07/10/2017 07/09/2017  Glucose 65 - 99 mg/dL 696(E) 952(W) 413(K)  BUN 6 - 20 mg/dL 44(W) 10 9  Creatinine 0.44 - 1.00 mg/dL 1.02 7.25 3.66  Sodium 135 - 145 mmol/L 129(L) 134(L) 131(L)  Potassium 3.5 - 5.1 mmol/L 4.1 3.5 3.5  Chloride 101 - 111 mmol/L 92(L) 98(L) 97(L)  CO2 22 - 32 mmol/L 23 27 26   Calcium 8.9 - 10.3 mg/dL 7.7(L) 7.3(L) 7.3(L)    CBC Latest Ref Rng & Units 07/15/2017 07/10/2017 07/09/2017  WBC 3.6 - 11.0 K/uL 16.7(H) 11.9(H) 9.0  Hemoglobin 12.0 - 16.0 g/dL 4.4(I) 8.9(L) 9.1(L)  Hematocrit 35.0 - 47.0 % 26.1(L) 26.3(L) 26.6(L)  Platelets 150 - 440 K/uL 296 185 159    CXR: Probable right basilar atelectasis, otherwise no acute findings  I have personally reviewed all chest radiographs reported above including CXRs and CT chest unless otherwise indicated  IMPRESSION:   Acute hypoxemic respiratory failure Acute pulmonary embolism   Risk factor of recent hip surgery At baseline relatively limited in physical capacity  This should not be characterized as a "submassive PE".  I do not think that the apparently dilated RV is due to these notably small pulmonary emboli.  There is no indication for systemic or directed thrombolysis.   PLAN:  Monitor in ICU/SDU Anticoagulation with heparin Supplemental oxygen as needed I changed BiPAP to PRN for respiratory distress Agree with echocardiogram, LE venous US as ordered There is no need to repeat lactate measurement.  I have canceled this order If she remains stable overnight, she may be transferred to MedSurg in AM  Since she has a clearly defined RF. Recommend anticoagulation x 6 months and discontinue IF she is fully ambulatory at that time   Billy Fischer, MD PCCM service Mobile [phone] Pager [phone] 07/15/2017 5:50 PM

## 2017-07-15 NOTE — Progress Notes (Signed)
Advanced care plan. Purpose of the Encounter: CODE STATUS Parties in Attendance:Patient and family Patient's Decision Capacity:Good Subjective/Patient's story: Presented to ER with shortness of breath Objective/Medical story Has Pulmonary embolism on CTA chest Goals of care determination:  Advance directives and goals of care discussed in detail Patient wants cardiac resuscitation, intubation and ventilator if need arises CODE STATUS: Full code Time spent discussing advanced care planning: 16 minutes

## 2017-07-15 NOTE — ED Notes (Signed)
Patient being trialed off of bipap per Dr. Mosetta PuttPyreddy's request.  Will continue to monitor patient.

## 2017-07-15 NOTE — ED Triage Notes (Signed)
Pt to ED via EMS from [facility] c/o respiratory distress around 1300 suddenly today, EMS states 80% on 2L [place], up to 94% 4L [place] for EMS, MD at [facility] for [facility], left hip surgery 1 week ago.  Patient placed on 5L [place] upon arrival to ED 94%.  Dr. Roxan Hockeyobinson at bedside, VORB to place patient on bipap, respiratory called.

## 2017-07-15 NOTE — ED Notes (Signed)
Pt respirations even and unlabored but maintaining 93% on 5L [place].  Dr. Tobi BastosPyreddy notified and wanted patient back on bipap.

## 2017-07-15 NOTE — Progress Notes (Signed)
Pharmacy Antibiotic Note  Cherrie GauzeCarolyn S Swingler is a 82 y.o. female admitted on 07/15/2017 with pneumonia.  Pharmacy has been consulted for vancomycin and cefepime dosing.  Plan: Vancomycin 1000mg  given in ED. Stacked dose calculated at 9 hours, then will follow with 1000mg  IV every 24 hours.  Goal trough 15-20 mcg/mL. Cefepime dose adjusted to 2 grams IV q12h based on indication and renal function. A vancomycin level will be drawn prior to the 4th dose.  Ke: 0.04, Vd: 42L, T1/2: 17h, calculated steady state levels: 43.8/17.5 mcg/mL  Height: 5\' 2"  (157.5 cm) Weight: 164 lb (74.4 kg) IBW/kg (Calculated) : 50.1  Temp (24hrs), Avg:97.8 F (36.6 C), Min:97.8 F (36.6 C), Max:97.8 F (36.6 C)  Recent Labs  Lab 07/09/17 0440 07/10/17 0422 07/15/17 1349  WBC 9.0 11.9* 16.7*  CREATININE 0.72 0.72 1.00    Estimated Creatinine Clearance: 43.1 mL/min (by C-G formula based on SCr of 1 mg/dL).    No Known Allergies  Antimicrobials this admission: Vancomycin 6/4 >>  Cefepime 6/4 >>   Microbiology results: 6/4 UCx: pending  6/4 MRSA PCR: pending  Thank you for allowing pharmacy to be a part of this patient's care.  Lowella BandyRodney D Garnell Begeman 07/15/2017 4:31 PM

## 2017-07-15 NOTE — Progress Notes (Signed)
MEDICATION RELATED CONSULT NOTE   Pharmacy Consult for phenobarbital Indication: seizure d/o  No Known Allergies  Patient Measurements: Height: 5\' 2"  (157.5 cm) Weight: 164 lb (74.4 kg) IBW/kg (Calculated) : 50.1  Vital Signs: Temp: 97.8 F (36.6 C) (06/04 1348) Temp Source: Oral (06/04 1348) BP: 120/70 (06/04 1630) Pulse Rate: 84 (06/04 1630) Intake/Output from previous day: No intake/output data recorded. Intake/Output from this shift: No intake/output data recorded.  Labs: Recent Labs    07/15/17 1349  WBC 16.7*  HGB 8.8*  HCT 26.1*  PLT 296  CREATININE 1.00  ALBUMIN 3.1*  PROT 6.6  AST 62*  ALT 56*  ALKPHOS 102  BILITOT 1.5*   Estimated Creatinine Clearance: 43.1 mL/min (by C-G formula based on SCr of 1 mg/dL).   Microbiology: Recent Results (from the past 720 hour(s))  MRSA PCR Screening     Status: None   Collection Time: 07/07/17 11:35 PM  Result Value Ref Range Status   MRSA by PCR NEGATIVE NEGATIVE Final    Comment:        The GeneXpert MRSA Assay (FDA approved for NASAL specimens only), is one component of a comprehensive MRSA colonization surveillance program. It is not intended to diagnose MRSA infection nor to guide or monitor treatment for MRSA infections. Performed at [facility], [address] [place], [place] [address]     Medical History: Past Medical History:  Diagnosis Date  . Seizures (HCC)   . Stroke [facility](HCC)     Assessment: Patient is taking phenobarbital 64.8mg  po every morning and 97.2mg  every evening  Goal of Therapy:  Resume dose of phenobarbital to home regimen, per Dr Tobi BastosPyreddy  Plan:  Resume taking phenobarbital 64.8mg  po every morning and 97.2mg  every evening  Lowella Bandyodney D Sederick Jacobsen, PharmD 07/15/2017,4:46 PM

## 2017-07-15 NOTE — H&P (Signed)
[facility] [place] [facility]   PATIENT NAME: Brittany RampCarolyn Hogan    MR#:  [id number]  DATE OF BIRTH:  01-08-1936  DATE OF ADMISSION:  07/15/2017  PRIMARY CARE PHYSICIAN: [name], [name], MD   REQUESTING/REFERRING PHYSICIAN:   CHIEF COMPLAINT:   Chief Complaint  Patient presents with  . Respiratory Distress    HISTORY OF PRESENT ILLNESS: Brittany RampCarolyn Hogan  is a 82 y.o. female with a known history of seizure disorder, CVA presented to the emergency room from [facility].  Patient was discharged from our hospital last week after she had a left hip surgery.  She was discharged to [facility] facility with DVT prophylaxis with oral aspirin 320 family twice daily.  She was found to be short of breath this morning.  In regard to family members she was also a little lethargic and confused yesterday.  She was found to be hypoxic with O2 saturation of 80% she was brought to the emergency room.  Patient was worked up with CTA chest in the emergency room.  Acute pulmonary embolism was noted in the right pulmonary artery.  Patient started on IV heparin drip for anticoagulation.  Patient was put on BiPAP for respiratory distress.  No complaints of any chest pain. Hospitalist service was consulted for further care.  PAST MEDICAL HISTORY:   Past Medical History:  Diagnosis Date  . Seizures (HCC)   . Stroke [facility](HCC)     PAST SURGICAL HISTORY:  Past Surgical History:  Procedure Laterality Date  . APPENDECTOMY    . BREAST BIOPSY Right    neg  . CHOLECYSTECTOMY N/A 09/27/2014   Procedure: LAPAROSCOPIC CHOLECYSTECTOMY with cholangiogram;  Surgeon: Kieth BrightlySeeplaputhur G Sankar, MD;  Location: [facility] ORS;  Service: General;  Laterality: N/A;  . INTRAMEDULLARY (IM) NAIL INTERTROCHANTERIC Left 07/08/2017   Procedure: INTRAMEDULLARY (IM) NAIL INTERTROCHANTRIC;  Surgeon: Juanell FairlyKrasinski, Kevin, MD;  Location: [facility] ORS;  Service: Orthopedics;  Laterality: Left;  . SHOULDER SURGERY Right      SOCIAL HISTORY:  Social History   Tobacco Use  . Smoking status: Never Smoker  . Smokeless tobacco: Never Used  Substance Use Topics  . Alcohol use: No    Alcohol/week: 0.0 oz    FAMILY HISTORY: History reviewed. No pertinent family history.  DRUG ALLERGIES: No Known Allergies  REVIEW OF SYSTEMS:   CONSTITUTIONAL: No fever, has fatigue and weakness.  EYES: No blurred or double vision.  EARS, NOSE, AND THROAT: No tinnitus or ear pain.  RESPIRATORY: No cough, has shortness of breath,  No wheezing or hemoptysis.  CARDIOVASCULAR: No chest pain, orthopnea, edema.  GASTROINTESTINAL: No nausea, vomiting, diarrhea or abdominal pain.  GENITOURINARY: No dysuria, hematuria.  ENDOCRINE: No polyuria, nocturia,  HEMATOLOGY: No anemia, easy bruising or bleeding SKIN: No rash or lesion. MUSCULOSKELETAL: No joint pain or arthritis.   NEUROLOGIC: No tingling, numbness, weakness.  PSYCHIATRY: No anxiety or depression.   MEDICATIONS AT HOME:  Prior to Admission medications   Medication Sig Start Date End Date Taking? Authorizing Provider  aspirin EC 325 MG tablet Take 1 tablet (325 mg total) by mouth daily. As per Dr.Krasinski patient will be on aspirin 325 mg p.o. twice daily for 1 month after that patient can resume her home dose. Patient taking differently: Take 325 mg by mouth 2 (two) times daily.  07/10/17  Yes Katha HammingKonidena, Snehalatha, MD  citalopram (CELEXA) 20 MG tablet Take 20 mg by mouth daily. 06/30/17  Yes [provider]  hydrochlorothiazide (HYDRODIURIL) 25  MG tablet Take 25 mg by mouth daily.  10/14/16  Yes [provider]  levothyroxine (SYNTHROID, LEVOTHROID) 25 MCG tablet Take 25 mcg by mouth daily. 06/16/17  Yes [provider]  nitrofurantoin, macrocrystal-monohydrate, (MACROBID) 100 MG capsule Take 100 mg by mouth 2 (two) times daily. 07/14/17 07/19/17 Yes [provider]  pantoprazole (PROTONIX) 40 MG tablet Take 40 mg by mouth daily.  10/21/16   Yes [provider]  PHENobarbital (LUMINAL) 64.8 MG tablet Take 64.8-97.2 tablets by mouth See admin instructions. 64.8 mg every morning and 97.2 every evening   Yes [provider]  phenytoin (DILANTIN) 100 MG ER capsule Take 100 mg by mouth 3 (three) times daily.   Yes [provider]  polyethylene glycol (MIRALAX) packet Take 17 g by mouth daily as needed for mild constipation or moderate constipation. Patient taking differently: Take 17 g by mouth daily.  01/30/15  Yes Schaevitz, Myra Rude, MD  HYDROcodone-acetaminophen (NORCO/VICODIN) 5-325 MG tablet Take 1-2 tablets by mouth every 4 (four) hours as needed for moderate pain. Patient not taking: Reported on 07/15/2017 07/10/17   Katha Hamming, MD  polyethylene glycol-electrolytes (TRILYTE) 420 G solution Take 4,000 mLs by mouth once. Patient not taking: Reported on 07/08/2017 02/01/15   Emily Filbert, MD      PHYSICAL EXAMINATION:   VITAL SIGNS: Blood pressure 120/70, pulse 84, temperature 97.8 F (36.6 C), temperature source Oral, resp. rate (!) 23, height 5\' 2"  (1.575 m), weight 74.4 kg (164 lb), SpO2 100 %.  GENERAL:  82 y.o.-year-old patient lying in the bed with no acute distress.  EYES: Pupils equal, round, reactive to light and accommodation. No scleral icterus. Extraocular muscles intact.  HEENT: Head atraumatic, normocephalic. Oropharynx and nasopharynx clear.  NECK:  Supple, no jugular venous distention. No thyroid enlargement, no tenderness.  LUNGS: Decreased breath sounds bilaterally, no wheezing, rales,rhonchi or crepitations. Currently on BiPAP IPAP 14 EPAP 6 Rate 8 FiO2 50% CARDIOVASCULAR: S1, S2 normal. No murmurs, rubs, or gallops.  ABDOMEN: Soft, nontender, nondistended. Bowel sounds present. No organomegaly or mass.  EXTREMITIES: No pedal edema, cyanosis, or clubbing.  NEUROLOGIC: Cranial nerves II through XII are intact. Muscle strength 5/5 in all extremities. Sensation  intact. Gait not checked.  PSYCHIATRIC: The patient is alert and oriented x 3.  SKIN: No obvious rash, lesion, or ulcer.   LABORATORY PANEL:   CBC Recent Labs  Lab 07/09/17 0440 07/10/17 0422 07/15/17 1349  WBC 9.0 11.9* 16.7*  HGB 9.1* 8.9* 8.8*  HCT 26.6* 26.3* 26.1*  PLT 159 185 296  MCV 91.1 91.4 93.4  MCH 31.3 31.1 31.4  MCHC 34.4 34.1 33.6  RDW 13.1 12.6 14.2  LYMPHSABS  --   --  1.3  MONOABS  --   --  1.8*  EOSABS  --   --  0.1  BASOSABS  --   --  0.1   ------------------------------------------------------------------------------------------------------------------  Chemistries  Recent Labs  Lab 07/09/17 0440 07/10/17 0422 07/15/17 1349  NA 131* 134* 129*  K 3.5 3.5 4.1  CL 97* 98* 92*  CO2 26 27 23   GLUCOSE 106* 126* 136*  BUN 9 10 28*  CREATININE 0.72 0.72 1.00  CALCIUM 7.3* 7.3* 7.7*  AST  --   --  62*  ALT  --   --  56*  ALKPHOS  --   --  102  BILITOT  --   --  1.5*   ------------------------------------------------------------------------------------------------------------------ estimated creatinine clearance is 43.1 mL/min (by C-G formula  based on SCr of 1 mg/dL). ------------------------------------------------------------------------------------------------------------------ No results for input(s): TSH, T4TOTAL, T3FREE, THYROIDAB in the last 72 hours.  Invalid input(s): FREET3   Coagulation profile No results for input(s): INR, PROTIME in the last 168 hours. ------------------------------------------------------------------------------------------------------------------- No results for input(s): DDIMER in the last 72 hours. -------------------------------------------------------------------------------------------------------------------  Cardiac Enzymes No results for input(s): CKMB, TROPONINI, MYOGLOBIN in the last 168 hours.  Invalid input(s):  CK ------------------------------------------------------------------------------------------------------------------ Invalid input(s): POCBNP  ---------------------------------------------------------------------------------------------------------------  Urinalysis    Component Value Date/Time   COLORURINE YELLOW (A) 02/01/2015 0825   APPEARANCEUR CLEAR (A) 02/01/2015 0825   APPEARANCEUR Clear 11/21/2013 1101   LABSPEC 1.011 02/01/2015 0825   LABSPEC 1.006 11/21/2013 1101   PHURINE 7.0 02/01/2015 0825   GLUCOSEU NEGATIVE 02/01/2015 0825   GLUCOSEU Negative 11/21/2013 1101   HGBUR NEGATIVE 02/01/2015 0825   BILIRUBINUR NEGATIVE 02/01/2015 0825   BILIRUBINUR Negative 11/21/2013 1101   KETONESUR NEGATIVE 02/01/2015 0825   PROTEINUR NEGATIVE 02/01/2015 0825   NITRITE NEGATIVE 02/01/2015 0825   LEUKOCYTESUR NEGATIVE 02/01/2015 0825   LEUKOCYTESUR 1+ 11/21/2013 1101     RADIOLOGY: Ct Angio Chest Pe W And/or Wo Contrast  Result Date: 07/15/2017 CLINICAL DATA:  Respiratory distress. EXAM: CT ANGIOGRAPHY CHEST WITH CONTRAST TECHNIQUE: Multidetector CT imaging of the chest was performed using the standard protocol during bolus administration of intravenous contrast. Multiplanar CT image reconstructions and MIPs were obtained to evaluate the vascular anatomy. CONTRAST:  75mL ISOVUE-370 IOPAMIDOL (ISOVUE-370) INJECTION 76% COMPARISON:  Radiograph of same day. FINDINGS: Cardiovascular: Linear filling defect is seen in lower lobe branches of right pulmonary artery consistent with acute pulmonary embolus. RV/LV ratio of 1.7 is noted suggesting right heart strain. Atherosclerosis of thoracic aorta is noted without aneurysm formation. Mediastinum/Nodes: Large hiatal hernia is noted. Thyroid gland is unremarkable. No mediastinal adenopathy is noted. Lungs/Pleura: No pneumothorax or pleural effusion is noted. Right lung is clear. 6 mm nodule is noted in left lower lobe. Upper Abdomen: No acute  abnormality. Musculoskeletal: No chest wall abnormality. No acute or significant osseous findings. Review of the MIP images confirms the above findings. IMPRESSION: Acute pulmonary embolus seen in lower lobe branch of right pulmonary artery. Positive for acute PE with CT evidence of right heart strain (RV/LV Ratio = 1.7) consistent with at least submassive (intermediate risk) PE. The presence of right heart strain has been associated with an increased risk of morbidity and mortality. Please activate Code PE by paging [phone]. Critical Value/emergent results were called by telephone at the time of interpretation on 07/15/2017 at 3:56 pm to Dr. Willy Eddy , who verbally acknowledged these results. Large sliding-type hiatal hernia. 6 mm left lower lobe nodule is noted. Non-contrast chest CT at 6-12 months is recommended. If the nodule is stable at time of repeat CT, then future CT at 18-24 months (from today's scan) is considered optional for low-risk patients, but is recommended for high-risk patients. This recommendation follows the consensus statement: Guidelines for Management of Incidental Pulmonary Nodules Detected on CT Images: From the Fleischner Society 2017; Radiology 2017; 284:228-243. Aortic Atherosclerosis (ICD10-I70.0). Electronically Signed   By: Lupita Raider, M.D.   On: 07/15/2017 15:56   Dg Chest Portable 1 View  Result Date: 07/15/2017 CLINICAL DATA:  82 year old female with respiratory distress. Initial encounter. EXAM: PORTABLE CHEST 1 VIEW COMPARISON:  04/08/2010 chest x-ray. FINDINGS: Mild pulmonary vascular congestion without frank pulmonary edema. Basilar subsegmental atelectasis greater on left Heart size top-normal to slightly enlarged. Calcified aorta. No acute osseous abnormality noted. IMPRESSION: Poor  inspiratory portable exam. Mild pulmonary vascular congestion without frank pulmonary edema. Basilar subsegmental atelectasis greater on left. Heart size top-normal slightly  enlarged. Aortic Atherosclerosis (ICD10-I70.0). Electronically Signed   By: Lacy Duverney M.D.   On: 07/15/2017 14:12    EKG: Orders placed or performed during the hospital encounter of 02/01/15  . ED EKG  . ED EKG  . EKG 12-Lead  . EKG 12-Lead  . EKG    IMPRESSION AND PLAN:  82 year old female patient with history of seizure disorder, CVA, left hip surgery presented to the emergency room for shortness of breath and respiratory distress.  - Acute hypoxic respiratory failure BiPAP for respiratory distress Secondary to pulmonary embolism  -Acute pulmonary embolism  IV heparin drip for anticoagulation Check echocardiogram to assess for pulmonary hypertension Doppler venous ultrasound of lower extremities to assess for DVT  -Seizure disorder Resume Dilantin and phenobarbital  -Leukocytosis Could be secondary to pulmonary embolism Check urinalysis  -Hyponatremia  monitor sodium level   All the records are reviewed and case discussed with ED provider. Management plans discussed with the patient, family and they are in agreement.  CODE STATUS: Full code Code Status History    Date Active Date Inactive Code Status Order ID Comments User Context   07/07/2017 2352 07/10/2017 2204 Full Code [id number]  Cammy Copa, MD Inpatient       TOTAL CRITICAL CARE TIME TAKING CARE OF THIS PATIENT: 55 minutes.    Ihor Austin M.D on 07/15/2017 at 4:33 PM  Between 7am to 6pm - Pager - [phone]  After 6pm go to [email or web address] - password [email or web address] [facility]  [facility]  Office  [phone]  CC: Primary care physician; Jaclyn Shaggy, MD

## 2017-07-16 ENCOUNTER — Inpatient Hospital Stay (HOSPITAL_COMMUNITY)
Admit: 2017-07-16 | Discharge: 2017-07-16 | Disposition: A | Discharge status: Home / Self Care | Payer: Medicare HMO | Attending: Internal Medicine | Admitting: Internal Medicine

## 2017-07-16 ENCOUNTER — Inpatient Hospital Stay: Payer: Medicare HMO

## 2017-07-16 DIAGNOSIS — I361 Nonrheumatic tricuspid (valve) insufficiency: Secondary | ICD-10-CM

## 2017-07-16 LAB — BASIC METABOLIC PANEL
Anion gap: 11 (ref 5–15)
BUN: 25 mg/dL — AB (ref 6–20)
CALCIUM: 7.4 mg/dL — AB (ref 8.9–10.3)
CO2: 25 mmol/L (ref 22–32)
Chloride: 95 mmol/L — ABNORMAL LOW (ref 101–111)
Creatinine, Ser: 0.94 mg/dL (ref 0.44–1.00)
GFR calc Af Amer: >60 mL/min (ref >60)
GFR, EST NON AFRICAN AMERICAN: 56 mL/min — AB (ref >60)
GLUCOSE: 122 mg/dL — AB (ref 65–99)
Potassium: 3.6 mmol/L (ref 3.5–5.1)
Sodium: 131 mmol/L — ABNORMAL LOW (ref 135–145)

## 2017-07-16 LAB — ECHOCARDIOGRAM COMPLETE
HEIGHTINCHES: 62 in
WEIGHTICAEL: 2839.52 [oz_av]

## 2017-07-16 LAB — CBC
HCT: 23.7 % — ABNORMAL LOW (ref 35.0–47.0)
Hemoglobin: 7.9 g/dL — ABNORMAL LOW (ref 12.0–16.0)
MCH: 30.8 pg (ref 26.0–34.0)
MCHC: 33.3 g/dL (ref 32.0–36.0)
MCV: 92.5 fL (ref 80.0–100.0)
Platelets: 281 10*3/uL (ref 150–440)
RBC: 2.57 MIL/uL — ABNORMAL LOW (ref 3.80–5.20)
RDW: 13.7 % (ref 11.5–14.5)
WBC: 15 10*3/uL — ABNORMAL HIGH (ref 3.6–11.0)

## 2017-07-16 LAB — TROPONIN I
Troponin I: 2.45 ng/mL (ref <0.03)
Troponin I: 2.32 ng/mL (ref <0.03)

## 2017-07-16 LAB — PROCALCITONIN

## 2017-07-16 LAB — HEPARIN LEVEL (UNFRACTIONATED)
Heparin Unfractionated: 0.35 IU/mL (ref 0.30–0.70)
Heparin Unfractionated: 0.23 IU/mL — ABNORMAL LOW (ref 0.30–0.70)
Heparin Unfractionated: 0.39 IU/mL (ref 0.30–0.70)

## 2017-07-16 LAB — PHENOBARBITAL LEVEL: PHENOBARBITAL: 40.2 ug/mL — AB (ref 15.0–30.0)

## 2017-07-16 LAB — PHENYTOIN LEVEL, TOTAL: PHENYTOIN LVL: 3.9 ug/mL — AB (ref 10.0–20.0)

## 2017-07-16 MED ORDER — HEPARIN BOLUS VIA INFUSION
1000.0000 [IU] | Freq: Once | INTRAVENOUS | Status: AC
  Administered 2017-07-16: 15:00:00 1000 [IU] via INTRAVENOUS
  Filled 2017-07-16: qty 1000

## 2017-07-16 MED ORDER — GUAIFENESIN-DM 100-10 MG/5ML PO SYRP
5.0000 mL | ORAL_SOLUTION | ORAL | Status: DC | PRN
  Filled 2017-07-16: qty 5

## 2017-07-16 NOTE — Progress Notes (Signed)
ANTICOAGULATION CONSULT NOTE   Pharmacy Consult for heparin Indication: pulmonary embolus  No Known Allergies  Patient Measurements: Height: 5\' 2"  (157.5 cm) Weight: 171 lb 1 oz (77.6 kg) IBW/kg (Calculated) : 50.1 Heparin Dosing Weight: 66kg  Vital Signs: Temp: 98.7 F (37.1 C) (06/05 2015) Temp Source: Oral (06/05 2015) BP: 107/60 (06/05 2015) Pulse Rate: 85 (06/05 2015)  Labs: Recent Labs    07/15/17 1349 07/15/17 1712 07/15/17 2110 07/16/17 0125 07/16/17 0333 07/16/17 1007 07/16/17 2111  HGB 8.8*  --  8.6*  --  7.9*  --   --   HCT 26.1*  --  25.5*  --  23.7*  --   --   PLT 296  --  289  --  281  --   --   APTT  --  109*  --   --   --   --   --   LABPROT  --  16.6*  --   --   --   --   --   INR  --  1.35  --   --   --   --   --   HEPARINUNFRC  --   --   --  0.35  --  0.23* 0.39  CREATININE 1.00  --   --   --  0.94  --   --   TROPONINI 1.41*  --  2.18*  --  2.45* 2.32*  --     Estimated Creatinine Clearance: 46.8 mL/min (by C-G formula based on SCr of 0.94 mg/dL).   Medical History: Past Medical History:  Diagnosis Date  . Seizures (HCC)   . Stroke [facility](HCC)     Assessment: 82yo female with acute PE on no anticoagulants PTA  Goal of Therapy:  Heparin level 0.3-0.7 units/ml Monitor platelets by anticoagulation protocol: Yes   Plan:  06/05 @ 0125 HL 0.38 therapeutic. Will continue rate @ 1100 units/hr and will recheck anti-Xa @ 0900. Will f/u on CBC w/ am labs.  06/05  HL @ 21:11 = 0.39 Will recheck HL on 6/6 with AM labs.   Celest Reitz D Clinical Pharmacist 07/16/2017

## 2017-07-16 NOTE — Progress Notes (Signed)
*  PRELIMINARY RESULTS* Echocardiogram 2D Echocardiogram has been performed.  Cristela BlueHege, Jesslyn Viglione 07/16/2017, 10:48 AM

## 2017-07-16 NOTE — Progress Notes (Signed)
[employer] [facility] [place] [facility]   PATIENT NAME: Brittany Hogan    MR#:  [id number]  DATE OF BIRTH:  28-Feb-1935  SUBJECTIVE:   Pt. Admitted to the hospital due to shortness of breath/ARF due to Acute pulmonary embolism.  Initially patient was on BiPAP but now has been weaned off of it.  Plan for transfer to the floor today.  Patient remains on a heparin drip.  She is somewhat lethargic.  REVIEW OF SYSTEMS:    Review of Systems  Unable to perform ROS: Mental acuity    Nutrition: Heart Healthy Tolerating Diet: Yes Tolerating PT: Await Eval.   DRUG ALLERGIES:  No Known Allergies  VITALS:  Blood pressure 116/64, pulse 83, temperature 97.9 F (36.6 C), temperature source Oral, resp. rate (!) 24, height 5\' 2"  (1.575 m), weight 77.6 kg (171 lb 1 oz), SpO2 94 %.  PHYSICAL EXAMINATION:   Physical Exam  GENERAL:  82 y.o.-year-old patient lying in bed in no acute distress.  EYES: Pupils equal, round, reactive to light and accommodation. No scleral icterus. Extraocular muscles intact.  HEENT: Head atraumatic, normocephalic. Oropharynx and nasopharynx clear.  NECK:  Supple, no jugular venous distention. No thyroid enlargement, no tenderness.  LUNGS: Normal breath sounds bilaterally, no wheezing, rales, rhonchi. No use of accessory muscles of respiration. Poor Resp. effort CARDIOVASCULAR: S1, S2 normal. No murmurs, rubs, or gallops.  ABDOMEN: Soft, nontender, nondistended. Bowel sounds present. No organomegaly or mass.  EXTREMITIES: No cyanosis, clubbing, b/l LE edema 1+    NEUROLOGIC: Cranial nerves II through XII are intact. No focal Motor or sensory deficits b/l.  Globally weak PSYCHIATRIC: The patient is alert and oriented x 1.  SKIN: No obvious rash, lesion, or ulcer.    LABORATORY PANEL:   CBC Recent Labs  Lab 07/16/17 0333  WBC 15.0*  HGB 7.9*  HCT 23.7*  PLT 281    ------------------------------------------------------------------------------------------------------------------  Chemistries  Recent Labs  Lab 07/15/17 1349 07/16/17 0333  NA 129* 131*  K 4.1 3.6  CL 92* 95*  CO2 23 25  GLUCOSE 136* 122*  BUN 28* 25*  CREATININE 1.00 0.94  CALCIUM 7.7* 7.4*  AST 62*  --   ALT 56*  --   ALKPHOS 102  --   BILITOT 1.5*  --    ------------------------------------------------------------------------------------------------------------------  Cardiac Enzymes Recent Labs  Lab 07/16/17 1007  TROPONINI 2.32*   ------------------------------------------------------------------------------------------------------------------  RADIOLOGY:  Ct Angio Chest Pe W And/or Wo Contrast  Result Date: 07/15/2017 CLINICAL DATA:  Respiratory distress. EXAM: CT ANGIOGRAPHY CHEST WITH CONTRAST TECHNIQUE: Multidetector CT imaging of the chest was performed using the standard protocol during bolus administration of intravenous contrast. Multiplanar CT image reconstructions and MIPs were obtained to evaluate the vascular anatomy. CONTRAST:  75mL ISOVUE-370 IOPAMIDOL (ISOVUE-370) INJECTION 76% COMPARISON:  Radiograph of same day. FINDINGS: Cardiovascular: Linear filling defect is seen in lower lobe branches of right pulmonary artery consistent with acute pulmonary embolus. RV/LV ratio of 1.7 is noted suggesting right heart strain. Atherosclerosis of thoracic aorta is noted without aneurysm formation. Mediastinum/Nodes: Large hiatal hernia is noted. Thyroid gland is unremarkable. No mediastinal adenopathy is noted. Lungs/Pleura: No pneumothorax or pleural effusion is noted. Right lung is clear. 6 mm nodule is noted in left lower lobe. Upper Abdomen: No acute abnormality. Musculoskeletal: No chest wall abnormality. No acute or significant osseous findings. Review of the MIP images confirms the above findings. IMPRESSION: Acute pulmonary embolus seen in lower lobe branch of  right pulmonary artery. Positive  for acute PE with CT evidence of right heart strain (RV/LV Ratio = 1.7) consistent with at least submassive (intermediate risk) PE. The presence of right heart strain has been associated with an increased risk of morbidity and mortality. Please activate Code PE by paging [phone]. Critical Value/emergent results were called by telephone at the time of interpretation on 07/15/2017 at 3:56 pm to Dr. Willy Eddy , who verbally acknowledged these results. Large sliding-type hiatal hernia. 6 mm left lower lobe nodule is noted. Non-contrast chest CT at 6-12 months is recommended. If the nodule is stable at time of repeat CT, then future CT at 18-24 months (from today's scan) is considered optional for low-risk patients, but is recommended for high-risk patients. This recommendation follows the consensus statement: Guidelines for Management of Incidental Pulmonary Nodules Detected on CT Images: From the Fleischner Society 2017; Radiology 2017; 284:228-243. Aortic Atherosclerosis (ICD10-I70.0). Electronically Signed   By: Lupita Raider, M.D.   On: 07/15/2017 15:56   US Venous Img Lower Bilateral  Result Date: 07/16/2017 CLINICAL DATA:  Right lower extremity pain and edema. History of pulmonary embolism. History of left hip surgery approximately 1 week ago. Evaluate for acute or chronic DVT. EXAM: BILATERAL LOWER EXTREMITY VENOUS DOPPLER ULTRASOUND TECHNIQUE: Gray-scale sonography with graded compression, as well as color Doppler and duplex ultrasound were performed to evaluate the lower extremity deep venous systems from the level of the common femoral vein and including the common femoral, femoral, profunda femoral, popliteal and calf veins including the posterior tibial, peroneal and gastrocnemius veins when visible. The superficial great saphenous vein was also interrogated. Spectral Doppler was utilized to evaluate flow at rest and with distal augmentation maneuvers in the  common femoral, femoral and popliteal veins. COMPARISON:  None. FINDINGS: RIGHT LOWER EXTREMITY Common Femoral Vein: No evidence of thrombus. Normal compressibility, respiratory phasicity and response to augmentation. Saphenofemoral Junction: No evidence of thrombus. Normal compressibility and flow on color Doppler imaging. Profunda Femoral Vein: No evidence of thrombus. Normal compressibility and flow on color Doppler imaging. Femoral Vein: No evidence of thrombus. Normal compressibility, respiratory phasicity and response to augmentation. Popliteal Vein: There is mixed echogenic expansile thrombus within the right popliteal vein (images 23 through 25), 27 to 29. Calf Veins: There is hypoechoic occlusive thrombus within both paired right posterior tibial veins. Peroneal veins are suboptimally imaged. Superficial Great Saphenous Vein: No evidence of thrombus. Normal compressibility. Venous Reflux:  None. Other Findings: There is hypoechoic thrombus seen within the proximal aspect the right lesser saphenous vein (image 26). LEFT LOWER EXTREMITY Common Femoral Vein: No evidence of thrombus. Normal compressibility, respiratory phasicity and response to augmentation. Saphenofemoral Junction: No evidence of thrombus. Normal compressibility and flow on color Doppler imaging. Profunda Femoral Vein: No evidence of thrombus. Normal compressibility and flow on color Doppler imaging. Femoral Vein: No evidence of thrombus. Normal compressibility, respiratory phasicity and response to augmentation. Popliteal Vein: No evidence of thrombus. Normal compressibility, respiratory phasicity and response to augmentation. Calf Veins: No evidence of thrombus. Normal compressibility and flow on color Doppler imaging. Superficial Great Saphenous Vein: No evidence of thrombus. Normal compressibility. Venous Reflux:  None. Other Findings:  None. IMPRESSION: 1. Age-indeterminate, though potentially acute, hypoechoic occlusive DVT within the  right popliteal vein. 2. Age-indeterminate, though potentially acute, hypoechoic occlusive SVT within the right lesser saphenous vein. 3. No evidence of acute or chronic DVT within the left lower extremity. Electronically Signed   By: Simonne Come M.D.   On: 07/16/2017 10:39   Dg  Chest Portable 1 View  Result Date: 07/15/2017 CLINICAL DATA:  82 year old female with respiratory distress. Initial encounter. EXAM: PORTABLE CHEST 1 VIEW COMPARISON:  04/08/2010 chest x-ray. FINDINGS: Mild pulmonary vascular congestion without frank pulmonary edema. Basilar subsegmental atelectasis greater on left Heart size top-normal to slightly enlarged. Calcified aorta. No acute osseous abnormality noted. IMPRESSION: Poor inspiratory portable exam. Mild pulmonary vascular congestion without frank pulmonary edema. Basilar subsegmental atelectasis greater on left. Heart size top-normal slightly enlarged. Aortic Atherosclerosis (ICD10-I70.0). Electronically Signed   By: Lacy DuverneySteven  Olson M.D.   On: 07/15/2017 14:12     ASSESSMENT AND PLAN:   82 year old female with past medical history of seizures, previous CVA, recent left hip replacement who was at a nursing home presents to the hospital due to shortness of breath and respiratory failure noted to have a pulmonary embolism.  1.  Acute respiratory failure with hypoxia- secondary to pulmonary embolism. -Initially patient was admitted to the ICU on BiPAP, now weaned off of it.  Remains on nasal cannula oxygen. -Continue heparin drip to treat underlying pulmonary embolism.  2.  Acute pulmonary embolism-this is a provoked VTE given the recent left hip surgery. - Patient's Doppler of lower extremity shows a subacute deep DVT of the right popliteal and saphenous vein. -Continue heparin drip, will switch over to oral Eliquis or Xarelto tomorrow.  3.  Altered mental status/encephalopathy-etiology unclear.  No acute seizures, no acute infectious source. - We will get Dilantin,  phenobarbital level.  4.  History of seizures-continue Dilantin, phenobarbital.  5.  Elevated troponin- suspected to be secondary to supply demand ischemia from underlying pulmonary embolism. -Await echocardiogram results.  If needed consider cardiology consult.  Patient remains on a heparin drip for now.  6.  Hypothyroidism-continue Synthroid.  7.  Depression-continue Celexa.     All the records are reviewed and case discussed with Care Management/Social Worker. Management plans discussed with the patient, family and they are in agreement.  CODE STATUS: Full code  DVT Prophylaxis: Heparin drip  TOTAL TIME TAKING CARE OF THIS PATIENT: 30 minutes.   POSSIBLE D/C IN 1-2 DAYS, DEPENDING ON CLINICAL CONDITION.   Houston SirenSAINANI,Leoma Folds J M.D on 07/16/2017 at 2:51 PM  Between 7am to 6pm - Pager - [phone]  After 6pm go to [email or web address] - Social research officer, governmentpassword [email or web address] [email or web address]  [facility] [place] [facility]  Office  [phone]  CC: Primary care physician; [name], [name], MD

## 2017-07-16 NOTE — Progress Notes (Signed)
Follow up - Critical Care Medicine Note  Patient Details:    Brittany Hogan is an 82 y.o. female.with a past medical history remarkable for seizures and stroke, status post ORIF for left-sided hip fracture, was discharged from rehabilitation facility on aspirin, presented to the emergency department with acute onset of shortness of breath, felt to have pulmonary emboli. Admitted to the intensive care unit for hypoxemia requiring BiPAP  Lines, Airways, Drains:   Anti-infectives:  Anti-infectives (From admission, onward)   Start     Dose/Rate Route Frequency Ordered Stop   07/16/17 0400  ceFEPIme (MAXIPIME) 2 g in sodium chloride 0.9 % 100 mL IVPB  Status:  Discontinued     2 g 200 mL/hr over 30 Minutes Intravenous Every 12 hours 07/15/17 1640 07/15/17 1644   07/16/17 0100  vancomycin (VANCOCIN) IVPB 1000 mg/200 mL premix  Status:  Discontinued     1,000 mg 200 mL/hr over 60 Minutes Intravenous Every 24 hours 07/15/17 1640 07/15/17 1644   07/15/17 1515  ceFEPIme (MAXIPIME) 2 g in sodium chloride 0.9 % 100 mL IVPB     2 g 200 mL/hr over 30 Minutes Intravenous  Once 07/15/17 1512 07/15/17 1629   07/15/17 1515  vancomycin (VANCOCIN) IVPB 1000 mg/200 mL premix     1,000 mg 200 mL/hr over 60 Minutes Intravenous  Once 07/15/17 1512 07/15/17 1731      Microbiology: Results for orders placed or performed during the hospital encounter of 07/15/17  Blood culture (routine x 2)     Status: None (Preliminary result)   Collection Time: 07/15/17  1:49 PM  Result Value Ref Range Status   Specimen Description BLOOD LEFT AC  Final   Special Requests   Final    BOTTLES DRAWN AEROBIC AND ANAEROBIC Blood Culture adequate volume   Culture   Final    NO GROWTH < 24 HOURS Performed at [facility], [address] [place], [place] [address]    Report Status PENDING  Incomplete  Blood culture (routine x 2)     Status: None (Preliminary result)   Collection Time: 07/15/17  3:20 PM  Result  Value Ref Range Status   Specimen Description BLOOD RIGHT ANTECUBITAL  Final   Special Requests   Final    BOTTLES DRAWN AEROBIC AND ANAEROBIC Blood Culture adequate volume   Culture   Final    NO GROWTH < 24 HOURS Performed at [facility], [address], [place], [place] [address]    Report Status PENDING  Incomplete  MRSA PCR Screening     Status: None   Collection Time: 07/15/17  8:55 PM  Result Value Ref Range Status   MRSA by PCR NEGATIVE NEGATIVE Final    Comment:        The GeneXpert MRSA Assay (FDA approved for NASAL specimens only), is one component of a comprehensive MRSA colonization surveillance program. It is not intended to diagnose MRSA infection nor to guide or monitor treatment for MRSA infections. Performed at [facility], [address] [place], [place] [address]     Events:patient has done well overnight, has been weaned off of BiPAP, presently on nasal cannula, awake, alert in no acute distress   Studies: Dg Pelvis 1-2 Views  Result Date: 07/08/2017 CLINICAL DATA:  Known left hip fracture following mechanical fall yesterday. History of previous seizure activity and chronic right lower extremity weakness. EXAM: PELVIS - 1-2 VIEW COMPARISON:  Limited views of the pelvis from an abdominal radiograph  of Jul 12, 2015 FINDINGS: There is an acute comminuted mildly distracted fracture of the intertrochanteric region of the left hip. The bones are subjectively osteopenic. The iliac bones and pubic bones appear intact. The right hip is grossly normal. IMPRESSION: Acute intertrochanteric fracture of the left hip. Electronically Signed   By: David  SwazilandJordan M.D.   On: 07/08/2017 07:24   Ct Angio Chest Pe W And/or Wo Contrast  Result Date: 07/15/2017 CLINICAL DATA:  Respiratory distress. EXAM: CT ANGIOGRAPHY CHEST WITH CONTRAST TECHNIQUE: Multidetector CT imaging of the chest was performed using the standard protocol during bolus administration of  intravenous contrast. Multiplanar CT image reconstructions and MIPs were obtained to evaluate the vascular anatomy. CONTRAST:  75mL ISOVUE-370 IOPAMIDOL (ISOVUE-370) INJECTION 76% COMPARISON:  Radiograph of same day. FINDINGS: Cardiovascular: Linear filling defect is seen in lower lobe branches of right pulmonary artery consistent with acute pulmonary embolus. RV/LV ratio of 1.7 is noted suggesting right heart strain. Atherosclerosis of thoracic aorta is noted without aneurysm formation. Mediastinum/Nodes: Large hiatal hernia is noted. Thyroid gland is unremarkable. No mediastinal adenopathy is noted. Lungs/Pleura: No pneumothorax or pleural effusion is noted. Right lung is clear. 6 mm nodule is noted in left lower lobe. Upper Abdomen: No acute abnormality. Musculoskeletal: No chest wall abnormality. No acute or significant osseous findings. Review of the MIP images confirms the above findings. IMPRESSION: Acute pulmonary embolus seen in lower lobe branch of right pulmonary artery. Positive for acute PE with CT evidence of right heart strain (RV/LV Ratio = 1.7) consistent with at least submassive (intermediate risk) PE. The presence of right heart strain has been associated with an increased risk of morbidity and mortality. Please activate Code PE by paging [phone]. Critical Value/emergent results were called by telephone at the time of interpretation on 07/15/2017 at 3:56 pm to Dr. Willy EddyPATRICK ROBINSON , who verbally acknowledged these results. Large sliding-type hiatal hernia. 6 mm left lower lobe nodule is noted. Non-contrast chest CT at 6-12 months is recommended. If the nodule is stable at time of repeat CT, then future CT at 18-24 months (from today's scan) is considered optional for low-risk patients, but is recommended for high-risk patients. This recommendation follows the consensus statement: Guidelines for Management of Incidental Pulmonary Nodules Detected on CT Images: From the Fleischner Society 2017;  Radiology 2017; 284:228-243. Aortic Atherosclerosis (ICD10-I70.0). Electronically Signed   By: Lupita RaiderJames  Green Jr, M.D.   On: 07/15/2017 15:56   Dg Chest Portable 1 View  Result Date: 07/15/2017 CLINICAL DATA:  82 year old female with respiratory distress. Initial encounter. EXAM: PORTABLE CHEST 1 VIEW COMPARISON:  04/08/2010 chest x-ray. FINDINGS: Mild pulmonary vascular congestion without frank pulmonary edema. Basilar subsegmental atelectasis greater on left Heart size top-normal to slightly enlarged. Calcified aorta. No acute osseous abnormality noted. IMPRESSION: Poor inspiratory portable exam. Mild pulmonary vascular congestion without frank pulmonary edema. Basilar subsegmental atelectasis greater on left. Heart size top-normal slightly enlarged. Aortic Atherosclerosis (ICD10-I70.0). Electronically Signed   By: Lacy DuverneySteven  Olson M.D.   On: 07/15/2017 14:12   Dg Hand Complete Right  Result Date: 07/09/2017 CLINICAL DATA:  Recent fall with right hand pain and swelling, initial encounter EXAM: RIGHT HAND - COMPLETE 3+ VIEW COMPARISON:  None. FINDINGS: Degenerative changes are noted most marked in the first MCP joint with marked remodeling of the joint. No acute fracture or dislocation is seen. Interphalangeal degenerative changes are noted as well. IMPRESSION: Degenerative change without acute abnormality. Electronically Signed   By: Alcide CleverMark  Lukens M.D.   On: 07/09/2017  09:16   Dg Hip Operative Unilat W Or W/o Pelvis Left  Result Date: 07/08/2017 CLINICAL DATA:  Status post ORIF for acute left hip fracture. EXAM: OPERATIVE left HIP (WITH PELVIS IF PERFORMED) 6 VIEWS TECHNIQUE: Fluoroscopic spot image(s) were submitted for interpretation post-operatively. Fluoro time reported is 1 minutes, 7 seconds COMPARISON:  Preoperative study of Jul 07, 2017 FINDINGS: The patient has undergone intramedullary rod and telescoping screw placement for fixation of a fracture of the intertrochanteric region of the left hip.  Radiographic positioning of the appliances is good. The fracture fragments are now in near anatomic in alignment. IMPRESSION: ORIF of an intertrochanteric fracture of the left hip. Electronically Signed   By: David  [place] M.D.   On: 07/08/2017 12:22   Dg Femur Min 2 Views Left  Result Date: 07/08/2017 CLINICAL DATA:  Postop left femur. EXAM: LEFT FEMUR 2 VIEWS COMPARISON:  07/08/2017. FINDINGS: ORIF left femur. Hardware intact. Anatomic alignment. Postsurgical changes noted about the soft tissues about the left hip. Surgical staples noted over the left hip. IMPRESSION: ORIF left femur.  Hardware intact.  Anatomic alignment. Electronically Signed   By: Maisie Fus  Register   On: 07/08/2017 13:49   Dg Femur Min 2 Views Left  Result Date: 07/08/2017 CLINICAL DATA:  Status post fall.  Known left hip fracture. EXAM: LEFT FEMUR 2 VIEWS COMPARISON:  AP view of the pelvis of today's date FINDINGS: Again demonstrated is the comminuted and distracted intertrochanteric fracture of the left hip. There is mild superior migration of the femoral shaft with respect to the base of the femoral neck. The femoral shaft is intact. The observed portions of the left knee are within the limits of normal for age. IMPRESSION: Acute comminuted distracted intertrochanteric fracture of the left hip. Electronically Signed   By: David  [place]iland M.D.   On: 07/08/2017 07:26    Consults:    Subjective:    Overnight Issues: no issues overnight  Objective:  Vital signs for last 24 hours: Temp:  [97.5 F (36.4 C)-97.8 F (36.6 C)] 97.5 F (36.4 C) (06/05 0200) Pulse Rate:  [78-102] 78 (06/05 0600) Resp:  [17-29] 19 (06/05 0600) BP: (100-134)/(58-90) 118/70 (06/05 0600) SpO2:  [94 %-100 %] 99 % (06/05 0600) FiO2 (%):  [40 %] 40 % (06/04 2100) Weight:  [164 lb (74.4 kg)-177 lb 7.5 oz (80.5 kg)] 177 lb 7.5 oz (80.5 kg) (06/04 2100)  Hemodynamic parameters for last 24 hours:    Intake/Output from previous day: 06/04 0701 -  06/05 0700 In: 507.6 [I.V.:507.6] Out: 1500 [Urine:1500]  Intake/Output this shift: No intake/output data recorded.  Vent settings for last 24 hours: FiO2 (%):  [40 %] 40 %  Physical Exam:  Vital signs: Please see the above listed vital signs HEENT: Trachea is midline, patient is awake, alert on nasal cannula, no accessory muscle utilization Cardiovascular: Regular rate and rhythm Pulmonary: Clear to auscultation Abdominal: Positive bowel sounds, soft exam Extremities: No clubbing cyanosis or edema noted Neurologic: No focal deficits appreciated  Assessment/Plan:   Acute hypoxemic respiratory failure, secondary to pulmonary embolism, doing well this morning, pending Doppler lower [place] is and echocardiography. Will monitor on nasal cannula and if remains hemodynamically stable will transfer to the floor      Shelly Spenser 07/16/2017  *Care during the described time interval was provided by me and/or other providers on the critical care team.  I have reviewed this patient's available data, including medical history, events of note, physical examination and test results as part  of my evaluation.

## 2017-07-16 NOTE — Consult Note (Signed)
[facility] Cardiology  CARDIOLOGY CONSULT NOTE  Patient ID: Brittany Hogan MRN: [id number] DOB/AGE: [birth year] 80 y.o.  Admit date: 07/15/2017 Referring Physician Cherlynn Kaiser Primary Physician [facility] Primary Cardiologist None per patient Reason for Consultation Elevated troponin  HPI: 82 year old female referred for evaluation of elevated troponin. The patient has a recent history of left hip surgery, stroke, and seizures. The patient denies a significant cardiac history. The patient presented to [facility] ER yesterday for evaluation of shortness of breath and hypoxemia. She was place on BiPAP, which has since been weaned. Chest CT revealed an acute pulmonary embolism in the right lower lobe artery. She was started on IV heparin. Admission labs notable for troponin of 1.41, 2.18, 2.45, and 2.32, hemoglobin 8.8, hematocrit 26.1.  ECG revealed sinus rhythm at a rate of 89 bpm with nonspecific T wave abnormalities, biphasic P waves, low voltage in the precordial leads- findings consistent with a pulmonary pattern, without evidence of acute ischemia. The patient denies chest pain. She states that her shortness of breath has improved, currently on supplemental oxygen via nasal cannula.    Review of systems complete and found to be negative unless listed above     Past Medical History:  Diagnosis Date  . Seizures (HCC)   . Stroke [facility])     Past Surgical History:  Procedure Laterality Date  . APPENDECTOMY    . BREAST BIOPSY Right    neg  . CHOLECYSTECTOMY N/A 09/27/2014   Procedure: LAPAROSCOPIC CHOLECYSTECTOMY with cholangiogram;  Surgeon: Kieth Brightly, MD;  Location: [facility] ORS;  Service: General;  Laterality: N/A;  . INTRAMEDULLARY (IM) NAIL INTERTROCHANTERIC Left 07/08/2017   Procedure: INTRAMEDULLARY (IM) NAIL INTERTROCHANTRIC;  Surgeon: Juanell Fairly, MD;  Location: [facility] ORS;  Service: Orthopedics;  Laterality: Left;  . SHOULDER SURGERY Right     Medications Prior to Admission   Medication Sig Dispense Refill Last Dose  . aspirin EC 325 MG tablet Take 1 tablet (325 mg total) by mouth daily. As per Dr.Krasinski patient will be on aspirin 325 mg p.o. twice daily for 1 month after that patient can resume her home dose. (Patient taking differently: Take 325 mg by mouth 2 (two) times daily. ) 60 tablet 0 07/15/2017 at 0900  . citalopram (CELEXA) 20 MG tablet Take 20 mg by mouth daily.   07/15/2017 at 0900  . hydrochlorothiazide (HYDRODIURIL) 25 MG tablet Take 25 mg by mouth daily.    07/15/2017 at 0900  . levothyroxine (SYNTHROID, LEVOTHROID) 25 MCG tablet Take 25 mcg by mouth daily.   07/15/2017 at 0630  . nitrofurantoin, macrocrystal-monohydrate, (MACROBID) 100 MG capsule Take 100 mg by mouth 2 (two) times daily.   07/15/2017 at 0900  . pantoprazole (PROTONIX) 40 MG tablet Take 40 mg by mouth daily.    07/15/2017 at 0630  . PHENobarbital (LUMINAL) 64.8 MG tablet Take 64.8-97.2 tablets by mouth See admin instructions. 64.8 mg every morning and 97.2 every evening   07/15/2017 at 0900  . phenytoin (DILANTIN) 100 MG ER capsule Take 100 mg by mouth 3 (three) times daily.   07/15/2017 at 0900  . polyethylene glycol (MIRALAX) packet Take 17 g by mouth daily as needed for mild constipation or moderate constipation. (Patient taking differently: Take 17 g by mouth daily. ) 14 each 0 07/14/2017 at 1900  . HYDROcodone-acetaminophen (NORCO/VICODIN) 5-325 MG tablet Take 1-2 tablets by mouth every 4 (four) hours as needed for moderate pain. (Patient not taking: Reported on 07/15/2017) 30 tablet 0 Not Taking at Unknown time  .  polyethylene glycol-electrolytes (TRILYTE) 420 G solution Take 4,000 mLs by mouth once. (Patient not taking: Reported on 07/08/2017) 4000 mL 0 Not Taking at Unknown time   Social History   Socioeconomic History  . Marital status: Married    Spouse name: Not on file  . Number of children: Not on file  . Years of education: Not on file  . Highest education level: Not on file   Occupational History  . Not on file  Social Needs  . Financial resource strain: Not on file  . Food insecurity:    Worry: Not on file    Inability: Not on file  . Transportation needs:    Medical: Not on file    Non-medical: Not on file  Tobacco Use  . Smoking status: Never Smoker  . Smokeless tobacco: Never Used  Substance and Sexual Activity  . Alcohol use: No    Alcohol/week: 0.0 oz  . Drug use: No  . Sexual activity: Not on file  Lifestyle  . Physical activity:    Days per week: Not on file    Minutes per session: Not on file  . Stress: Not on file  Relationships  . Social connections:    Talks on phone: Not on file    Gets together: Not on file    Attends religious service: Not on file    Active member of club or organization: Not on file    Attends meetings of clubs or organizations: Not on file    Relationship status: Not on file  . Intimate partner violence:    Fear of current or ex partner: Not on file    Emotionally abused: Not on file    Physically abused: Not on file    Forced sexual activity: Not on file  Other Topics Concern  . Not on file  Social History Narrative  . Not on file    History reviewed. No pertinent family history.    Review of systems complete and found to be negative unless listed above      PHYSICAL EXAM  General: Well developed, well nourished, in no acute distress, lying supine in bed HEENT:  Normocephalic and atramatic Neck:  No JVD.  Lungs: Clear bilaterally to auscultation, normal effort of breathing on supplemental oxygen. No accessory respiratory muscle use. Heart: HRRR . Normal S1 and S2 without gallops or murmurs.  Abdomen: Bowel sounds are positive, abdomen nondistended Extremities: 1+ bilateral lower extremity edema.   Neuro: Alert and oriented X 3. Psych:  Good affect, responds appropriately  Labs:   Lab Results  Component Value Date   WBC 15.0 (H) 07/16/2017   HGB 7.9 (L) 07/16/2017   HCT 23.7 (L)  07/16/2017   MCV 92.5 07/16/2017   PLT 281 07/16/2017    Recent Labs  Lab 07/15/17 1349 07/16/17 0333  NA 129* 131*  K 4.1 3.6  CL 92* 95*  CO2 23 25  BUN 28* 25*  CREATININE 1.00 0.94  CALCIUM 7.7* 7.4*  PROT 6.6  --   BILITOT 1.5*  --   ALKPHOS 102  --   ALT 56*  --   AST 62*  --   GLUCOSE 136* 122*   Lab Results  Component Value Date   CKTOTAL 66 06/10/2011   CKMB 1.5 06/10/2011   TROPONINI 2.32 (HH) 07/16/2017    Lab Results  Component Value Date   CHOL 151 06/11/2011   Lab Results  Component Value Date   HDL 63 (H) 06/11/2011  Lab Results  Component Value Date   LDLCALC 72 06/11/2011   Lab Results  Component Value Date   TRIG 82 06/11/2011   No results found for: CHOLHDL No results found for: LDLDIRECT    Radiology: Dg Pelvis 1-2 Views  Result Date: 07/08/2017 CLINICAL DATA:  Known left hip fracture following mechanical fall yesterday. History of previous seizure activity and chronic right lower extremity weakness. EXAM: PELVIS - 1-2 VIEW COMPARISON:  Limited views of the pelvis from an abdominal radiograph of Jul 12, 2015 FINDINGS: There is an acute comminuted mildly distracted fracture of the intertrochanteric region of the left hip. The bones are subjectively osteopenic. The iliac bones and pubic bones appear intact. The right hip is grossly normal. IMPRESSION: Acute intertrochanteric fracture of the left hip. Electronically Signed   By: David  [place] M.D.   On: 07/08/2017 07:24   Ct Angio Chest Pe W And/or Wo Contrast  Result Date: 07/15/2017 CLINICAL DATA:  Respiratory distress. EXAM: CT ANGIOGRAPHY CHEST WITH CONTRAST TECHNIQUE: Multidetector CT imaging of the chest was performed using the standard protocol during bolus administration of intravenous contrast. Multiplanar CT image reconstructions and MIPs were obtained to evaluate the vascular anatomy. CONTRAST:  75mL ISOVUE-370 IOPAMIDOL (ISOVUE-370) INJECTION 76% COMPARISON:  Radiograph of same day.  FINDINGS: Cardiovascular: Linear filling defect is seen in lower lobe branches of right pulmonary artery consistent with acute pulmonary embolus. RV/LV ratio of 1.7 is noted suggesting right heart strain. Atherosclerosis of thoracic aorta is noted without aneurysm formation. Mediastinum/Nodes: Large hiatal hernia is noted. Thyroid gland is unremarkable. No mediastinal adenopathy is noted. Lungs/Pleura: No pneumothorax or pleural effusion is noted. Right lung is clear. 6 mm nodule is noted in left lower lobe. Upper Abdomen: No acute abnormality. Musculoskeletal: No chest wall abnormality. No acute or significant osseous findings. Review of the MIP images confirms the above findings. IMPRESSION: Acute pulmonary embolus seen in lower lobe branch of right pulmonary artery. Positive for acute PE with CT evidence of right heart strain (RV/LV Ratio = 1.7) consistent with at least submassive (intermediate risk) PE. The presence of right heart strain has been associated with an increased risk of morbidity and mortality. Please activate Code PE by paging [phone]. Critical Value/emergent results were called by telephone at the time of interpretation on 07/15/2017 at 3:56 pm to Dr. Willy Eddy , who verbally acknowledged these results. Large sliding-type hiatal hernia. 6 mm left lower lobe nodule is noted. Non-contrast chest CT at 6-12 months is recommended. If the nodule is stable at time of repeat CT, then future CT at 18-24 months (from today's scan) is considered optional for low-risk patients, but is recommended for high-risk patients. This recommendation follows the consensus statement: Guidelines for Management of Incidental Pulmonary Nodules Detected on CT Images: From the Fleischner Society 2017; Radiology 2017; 284:228-243. Aortic Atherosclerosis (ICD10-I70.0). Electronically Signed   By: Lupita Raider, M.D.   On: 07/15/2017 15:56   US Venous Img Lower Bilateral  Result Date: 07/16/2017 CLINICAL DATA:   Right lower extremity pain and edema. History of pulmonary embolism. History of left hip surgery approximately 1 week ago. Evaluate for acute or chronic DVT. EXAM: BILATERAL LOWER EXTREMITY VENOUS DOPPLER ULTRASOUND TECHNIQUE: Gray-scale sonography with graded compression, as well as color Doppler and duplex ultrasound were performed to evaluate the lower extremity deep venous systems from the level of the common femoral vein and including the common femoral, femoral, profunda femoral, popliteal and calf veins including the posterior tibial, peroneal and gastrocnemius veins  when visible. The superficial great saphenous vein was also interrogated. Spectral Doppler was utilized to evaluate flow at rest and with distal augmentation maneuvers in the common femoral, femoral and popliteal veins. COMPARISON:  None. FINDINGS: RIGHT LOWER EXTREMITY Common Femoral Vein: No evidence of thrombus. Normal compressibility, respiratory phasicity and response to augmentation. Saphenofemoral Junction: No evidence of thrombus. Normal compressibility and flow on color Doppler imaging. Profunda Femoral Vein: No evidence of thrombus. Normal compressibility and flow on color Doppler imaging. Femoral Vein: No evidence of thrombus. Normal compressibility, respiratory phasicity and response to augmentation. Popliteal Vein: There is mixed echogenic expansile thrombus within the right popliteal vein (images 23 through 25), 27 to 29. Calf Veins: There is hypoechoic occlusive thrombus within both paired right posterior tibial veins. Peroneal veins are suboptimally imaged. Superficial Great Saphenous Vein: No evidence of thrombus. Normal compressibility. Venous Reflux:  None. Other Findings: There is hypoechoic thrombus seen within the proximal aspect the right lesser saphenous vein (image 26). LEFT LOWER EXTREMITY Common Femoral Vein: No evidence of thrombus. Normal compressibility, respiratory phasicity and response to augmentation.  Saphenofemoral Junction: No evidence of thrombus. Normal compressibility and flow on color Doppler imaging. Profunda Femoral Vein: No evidence of thrombus. Normal compressibility and flow on color Doppler imaging. Femoral Vein: No evidence of thrombus. Normal compressibility, respiratory phasicity and response to augmentation. Popliteal Vein: No evidence of thrombus. Normal compressibility, respiratory phasicity and response to augmentation. Calf Veins: No evidence of thrombus. Normal compressibility and flow on color Doppler imaging. Superficial Great Saphenous Vein: No evidence of thrombus. Normal compressibility. Venous Reflux:  None. Other Findings:  None. IMPRESSION: 1. Age-indeterminate, though potentially acute, hypoechoic occlusive DVT within the right popliteal vein. 2. Age-indeterminate, though potentially acute, hypoechoic occlusive SVT within the right lesser saphenous vein. 3. No evidence of acute or chronic DVT within the left lower extremity. Electronically Signed   By: Simonne Come M.D.   On: 07/16/2017 10:39   Dg Chest Portable 1 View  Result Date: 07/15/2017 CLINICAL DATA:  82 year old female with respiratory distress. Initial encounter. EXAM: PORTABLE CHEST 1 VIEW COMPARISON:  04/08/2010 chest x-ray. FINDINGS: Mild pulmonary vascular congestion without frank pulmonary edema. Basilar subsegmental atelectasis greater on left Heart size top-normal to slightly enlarged. Calcified aorta. No acute osseous abnormality noted. IMPRESSION: Poor inspiratory portable exam. Mild pulmonary vascular congestion without frank pulmonary edema. Basilar subsegmental atelectasis greater on left. Heart size top-normal slightly enlarged. Aortic Atherosclerosis (ICD10-I70.0). Electronically Signed   By: Lacy Duverney M.D.   On: 07/15/2017 14:12   Dg Hand Complete Right  Result Date: 07/09/2017 CLINICAL DATA:  Recent fall with right hand pain and swelling, initial encounter EXAM: RIGHT HAND - COMPLETE 3+ VIEW  COMPARISON:  None. FINDINGS: Degenerative changes are noted most marked in the first MCP joint with marked remodeling of the joint. No acute fracture or dislocation is seen. Interphalangeal degenerative changes are noted as well. IMPRESSION: Degenerative change without acute abnormality. Electronically Signed   By: Alcide Clever M.D.   On: 07/09/2017 09:16   Dg Hip Operative Unilat W Or W/o Pelvis Left  Result Date: 07/08/2017 CLINICAL DATA:  Status post ORIF for acute left hip fracture. EXAM: OPERATIVE left HIP (WITH PELVIS IF PERFORMED) 6 VIEWS TECHNIQUE: Fluoroscopic spot image(s) were submitted for interpretation post-operatively. Fluoro time reported is 1 minutes, 7 seconds COMPARISON:  Preoperative study of Jul 07, 2017 FINDINGS: The patient has undergone intramedullary rod and telescoping screw placement for fixation of a fracture of the intertrochanteric region of the  left hip. Radiographic positioning of the appliances is good. The fracture fragments are now in near anatomic in alignment. IMPRESSION: ORIF of an intertrochanteric fracture of the left hip. Electronically Signed   By: David  [place]azilandJordan M.D.   On: 07/08/2017 12:22   Dg Femur Min 2 Views Left  Result Date: 07/08/2017 CLINICAL DATA:  Postop left femur. EXAM: LEFT FEMUR 2 VIEWS COMPARISON:  07/08/2017. FINDINGS: ORIF left femur. Hardware intact. Anatomic alignment. Postsurgical changes noted about the soft tissues about the left hip. Surgical staples noted over the left hip. IMPRESSION: ORIF left femur.  Hardware intact.  Anatomic alignment. Electronically Signed   By: Maisie Fushomas  Register   On: 07/08/2017 13:49   Dg Femur Min 2 Views Left  Result Date: 07/08/2017 CLINICAL DATA:  Status post fall.  Known left hip fracture. EXAM: LEFT FEMUR 2 VIEWS COMPARISON:  AP view of the pelvis of today's date FINDINGS: Again demonstrated is the comminuted and distracted intertrochanteric fracture of the left hip. There is mild superior migration of the  femoral shaft with respect to the base of the femoral neck. The femoral shaft is intact. The observed portions of the left knee are within the limits of normal for age. IMPRESSION: Acute comminuted distracted intertrochanteric fracture of the left hip. Electronically Signed   By: David  [place]andJordan M.D.   On: 07/08/2017 07:26    EKG: Sinus rhythm, 89 bpm  ASSESSMENT AND PLAN:  1. Elevated troponin in the setting of acute respiratory failure with hypoxia secondary to pulmonary embolism. Troponin of 1.41, 2.18, 2.45, and 2.32, in the absence of chest pain, with improved shortness of breath. ECG revealed sinus rhythm at a rate of 89 bpm with nonspecific T wave abnormalities, biphasic P waves, low voltage in the precordial leads- findings consistent with a pulmonary pattern, without evidence of acute ischemia.  2. Acute pulmonary embolism, status post recent left hip surgery, on heparin drip 3. Acute respiratory failure with hypoxia secondary to PE, patient reports that her breathing has improved  Plan: 1. Agree with overall therapy 2. Defer cardiac catheterization or further cardiac diagnostics at this time as elevated troponin and ECG is consistent with pulmonary embolism rather than ACS.   Sign off for now; call with any questions.   Signed: Leanora Ivanoffnna Burnadette Baskett PA-C 07/16/2017, 5:01 PM   The exam findings, history, and ECG were discussed with Dr. Darrold JunkerParaschos and the plan was made in collaboration with him.

## 2017-07-16 NOTE — Progress Notes (Signed)
E-link called at this time regarding elevated troponin of 2.18.  Spoke with Marisue IvanLiz, RN.  Will continue to monitor.

## 2017-07-16 NOTE — Progress Notes (Signed)
ANTICOAGULATION CONSULT NOTE   Pharmacy Consult for heparin Indication: pulmonary embolus  No Known Allergies  Patient Measurements: Height: 5\' 2"  (157.5 cm) Weight: 177 lb 7.5 oz (80.5 kg) IBW/kg (Calculated) : 50.1 Heparin Dosing Weight: 66kg  Vital Signs: Temp: 97.6 F (36.4 C) (06/04 2100) Temp Source: Oral (06/04 2100) BP: 128/78 (06/04 2100) Pulse Rate: 84 (06/04 1830)  Labs: Recent Labs    07/15/17 1349 07/15/17 1712 07/15/17 2110 07/16/17 0125  HGB 8.8*  --  8.6*  --   HCT 26.1*  --  25.5*  --   PLT 296  --  289  --   APTT  --  109*  --   --   LABPROT  --  16.6*  --   --   INR  --  1.35  --   --   HEPARINUNFRC  --   --   --  0.35  CREATININE 1.00  --   --   --   TROPONINI 1.41*  --  2.18*  --     Estimated Creatinine Clearance: 44.9 mL/min (by C-G formula based on SCr of 1 mg/dL).   Medical History: Past Medical History:  Diagnosis Date  . Seizures (HCC)   . Stroke [facility](HCC)     Assessment: 82yo female with acute PE on no anticoagulants PTA  Goal of Therapy:  Heparin level 0.3-0.7 units/ml Monitor platelets by anticoagulation protocol: Yes   Plan:  06/05 @ 0125 HL 0.38 therapeutic. Will continue rate @ 1100 units/hr and will recheck anti-Xa @ 0900. Will f/u on CBC w/ am labs.  Thomasene Rippleavid Geordie Nooney, PharmD, BCPS Clinical Pharmacist 07/16/2017

## 2017-07-17 LAB — HEPARIN LEVEL (UNFRACTIONATED): HEPARIN UNFRACTIONATED: 0.31 [IU]/mL (ref 0.30–0.70)

## 2017-07-17 LAB — BASIC METABOLIC PANEL
Anion gap: 10 (ref 5–15)
BUN: 22 mg/dL — ABNORMAL HIGH (ref 6–20)
CHLORIDE: 97 mmol/L — AB (ref 101–111)
CO2: 24 mmol/L (ref 22–32)
Calcium: 7.2 mg/dL — ABNORMAL LOW (ref 8.9–10.3)
Creatinine, Ser: 0.7 mg/dL (ref 0.44–1.00)
Glucose, Bld: 105 mg/dL — ABNORMAL HIGH (ref 65–99)
POTASSIUM: 3 mmol/L — AB (ref 3.5–5.1)
SODIUM: 131 mmol/L — AB (ref 135–145)

## 2017-07-17 LAB — PROCALCITONIN: PROCALCITONIN: 0.12 ng/mL

## 2017-07-17 LAB — CBC
HCT: 22.5 % — ABNORMAL LOW (ref 35.0–47.0)
HEMOGLOBIN: 7.6 g/dL — AB (ref 12.0–16.0)
MCH: 31.2 pg (ref 26.0–34.0)
MCHC: 33.5 g/dL (ref 32.0–36.0)
MCV: 93.2 fL (ref 80.0–100.0)
PLATELETS: 321 10*3/uL (ref 150–440)
RBC: 2.42 MIL/uL — AB (ref 3.80–5.20)
RDW: 14.3 % (ref 11.5–14.5)
WBC: 15 10*3/uL — ABNORMAL HIGH (ref 3.6–11.0)

## 2017-07-17 MED ORDER — ORAL CARE MOUTH RINSE
15.0000 mL | Freq: Two times a day (BID) | OROMUCOSAL | Status: DC
  Administered 2017-07-17 – 2017-07-21 (×8): 15 mL via OROMUCOSAL

## 2017-07-17 MED ORDER — ENOXAPARIN SODIUM 80 MG/0.8ML ~~LOC~~ SOLN
1.0000 mg/kg | Freq: Once | SUBCUTANEOUS | Status: AC
  Administered 2017-07-17: 80 mg via SUBCUTANEOUS
  Filled 2017-07-17: qty 0.8

## 2017-07-17 MED ORDER — WARFARIN - PHARMACIST DOSING INPATIENT
Freq: Every day | Status: DC
  Administered 2017-07-20: 20:00:00

## 2017-07-17 MED ORDER — ENOXAPARIN SODIUM 80 MG/0.8ML ~~LOC~~ SOLN
1.0000 mg/kg | Freq: Two times a day (BID) | SUBCUTANEOUS | Status: DC
  Administered 2017-07-17 – 2017-07-19 (×5): 80 mg via SUBCUTANEOUS
  Filled 2017-07-17 (×6): qty 0.8

## 2017-07-17 MED ORDER — POTASSIUM CHLORIDE 20 MEQ PO PACK
40.0000 meq | PACK | Freq: Once | ORAL | Status: AC
  Administered 2017-07-17: 14:00:00 40 meq via ORAL
  Filled 2017-07-17: qty 2

## 2017-07-17 MED ORDER — PHENOBARBITAL 32.4 MG PO TABS: 64.8000 mg | ORAL_TABLET | Freq: Every evening | ORAL | Status: DC

## 2017-07-17 MED ORDER — WARFARIN SODIUM 5 MG PO TABS
5.0000 mg | ORAL_TABLET | Freq: Once | ORAL | Status: AC
  Administered 2017-07-17: 5 mg via ORAL
  Filled 2017-07-17: qty 1

## 2017-07-17 NOTE — Consult Note (Signed)
ANTICOAGULATION CONSULT NOTE - Initial Consult  Pharmacy Consult for warfarin/lovenox Indication: pulmonary embolus  No Known Allergies  Patient Measurements: Height: 5\' 2"  (157.5 cm) Weight: 171 lb 1 oz (77.6 kg) IBW/kg (Calculated) : 50.1 Heparin Dosing Weight:   Vital Signs: Temp: 98.5 F (36.9 C) (06/06 1228) Temp Source: Oral (06/06 1228) BP: 110/61 (06/06 1228) Pulse Rate: 83 (06/06 1228)  Labs: Recent Labs    07/15/17 1349 07/15/17 1712 07/15/17 2110  07/16/17 0333 07/16/17 1007 07/16/17 2111 07/17/17 0533  HGB 8.8*  --  8.6*  --  7.9*  --   --  7.6*  HCT 26.1*  --  25.5*  --  23.7*  --   --  22.5*  PLT 296  --  289  --  281  --   --  321  APTT  --  109*  --   --   --   --   --   --   LABPROT  --  16.6*  --   --   --   --   --   --   INR  --  1.35  --   --   --   --   --   --   HEPARINUNFRC  --   --   --    < >  --  0.23* 0.39 0.31  CREATININE 1.00  --   --   --  0.94  --   --  0.70  TROPONINI 1.41*  --  2.18*  --  2.45* 2.32*  --   --    < > = values in this interval not displayed.    Estimated Creatinine Clearance: 55 mL/min (by C-G formula based on SCr of 0.7 mg/dL).   Medical History: Past Medical History:  Diagnosis Date  . Seizures (HCC)   . Stroke [facility](HCC)     Medications:  Scheduled:  . citalopram  20 mg Oral Daily  . enoxaparin (LOVENOX) injection  1 mg/kg Subcutaneous Once  . enoxaparin (LOVENOX) injection  1 mg/kg Subcutaneous Q12H  . levothyroxine  25 mcg Oral QAC breakfast  . pantoprazole  40 mg Oral Daily  . phenobarbital  64.8 mg Oral Daily  . [START ON 07/18/2017] phenobarbital  64.8 mg Oral QPM  . phenytoin  100 mg Oral TID  . potassium chloride  40 mEq Oral Once  . sodium chloride flush  3 mL Intravenous Q12H  . warfarin  5 mg Oral ONCE-1800  . Warfarin - Pharmacist Dosing Inpatient   Does not apply q1800    Assessment: Patient is a 82 year old female found to have a provoked PE/DVT- recent hip surgery. Pharmacy originally  consulted to dose apixaban, however pt is on phenobarbitol and phenytoin which are both strong inducers of CYP3A4- decreasing the concentration of apixaban. Therefore its strongly recommended that it not be used. Unfortunately, xarelto has the same interaction, pradaxa is induced by phenytoin and Savasya, although to the lesser degree is induced as well. After discussion with Dr. Carla DrapeSainai, I think the best option is warfarin as levels can be monitored for efficacy.   Warfarin does still have drug interactions with phenytoin (increases warfarin effect) and pheyobarbital (decreases warfarin effect) the dose can be adjusted and levels can be easily monitored. Will bridge with subq lovenx until INR therapeutic x2  Goal of Therapy:  INR 2-3 Monitor platelets by anticoagulation protocol: Yes   Plan:  It is recommended per protocol to start with < 5mg  daily  per algorithm due to interaction with phenytoin, however due to decreased efficacy with phenyobaritol, I have chosen to start with 5mg  tonight. Lovenox 1mg /kg BID. Will check INR and CBC in the AM. Hgb on the lower end, will monitor closely.   Olene Floss, Pharm.D, BCPS Clinical Pharmacist 07/17/2017,12:45 PM

## 2017-07-17 NOTE — [place] FL2 (Signed)
[place] MEDICAID FL2 LEVEL OF CARE SCREENING TOOL     IDENTIFICATION  Patient Name: Brittany Hogan Birthdate: 11/24/1935 Sex: female Admission Date (Current Location): 07/15/2017  [place] and [place] Number:  Chiropodist and Address:  [facility], [address] [place], [place] [address]      Provider Number: [id number]  Attending Physician Name and Address:  Houston Siren, MD  Relative Name and Phone Number:       Current Level of Care: Hospital Recommended Level of Care: Skilled Nursing Facility Prior Approval Number:    Date Approved/Denied:   PASRR Number: [id number]  Discharge Plan: SNF    Current Diagnoses: Patient Active Problem List   Diagnosis Date Noted  . Respiratory failure (HCC) 07/15/2017  . Hip fracture, unspecified laterality, closed, initial encounter (HCC) 07/07/2017  . Hip fracture (HCC) 07/07/2017  . Lower abdominal pain 10/29/2016  . Vaginal lesion 10/29/2016    Orientation RESPIRATION BLADDER Height & Weight     Self, Time, Place  O2(2 liters) Incontinent Weight: 171 lb 1 oz (77.6 kg) Height:  5\' 2"  (157.5 cm)  BEHAVIORAL SYMPTOMS/MOOD NEUROLOGICAL BOWEL NUTRITION STATUS  (none) (none) Incontinent Diet(Heart Healthy )  AMBULATORY STATUS COMMUNICATION OF NEEDS Skin   Extensive Assist Verbally Other (Comment)(Left Hip incision )                       Personal Care Assistance Level of Assistance  Bathing, Feeding, Dressing Bathing Assistance: Limited assistance Feeding assistance: Independent Dressing Assistance: Limited assistance     Functional Limitations Info  Sight, Hearing, Speech Sight Info: Adequate Hearing Info: Adequate Speech Info: Adequate    SPECIAL CARE FACTORS FREQUENCY  PT (By licensed PT), OT (By licensed OT)                    Contractures Contractures Info: Not present    Additional Factors Info  Code Status, Allergies Code Status Info: Full Code   Allergies Info: NKA           Current Medications (07/17/2017):  This is the current hospital active medication list Current Facility-Administered Medications  Medication Dose Route Frequency Provider Last Rate Last Dose  . 0.9 %  sodium chloride infusion  250 mL Intravenous PRN Pyreddy, Vivien Rota, MD      . acetaminophen (TYLENOL) tablet 650 mg  650 mg Oral Q6H PRN Pyreddy, Vivien Rota, MD       Or  . acetaminophen (TYLENOL) suppository 650 mg  650 mg Rectal Q6H PRN Pyreddy, Pavan, MD      . citalopram (CELEXA) tablet 20 mg  20 mg Oral Daily Pyreddy, Vivien Rota, MD   20 mg at 07/17/17 0853  . enoxaparin (LOVENOX) injection 80 mg  1 mg/kg Subcutaneous Q12H Olene Floss, RPH      . guaiFENesin-dextromethorphan (ROBITUSSIN DM) 100-10 MG/5ML syrup 5 mL  5 mL Oral Q4H PRN Houston Siren, MD      . levothyroxine (SYNTHROID, LEVOTHROID) tablet 25 mcg  25 mcg Oral QAC breakfast Ihor Austin, MD   25 mcg at 07/17/17 0853  . MEDLINE mouth rinse  15 mL Mouth Rinse BID Houston Siren, MD      . ondansetron [facility]) tablet 4 mg  4 mg Oral Q6H PRN Ihor Austin, MD       Or  . ondansetron (ZOFRAN) injection 4 mg  4 mg Intravenous Q6H PRN Ihor Austin, MD      .  pantoprazole (PROTONIX) EC tablet 40 mg  40 mg Oral Daily Pyreddy, Vivien RotaPavan, MD   40 mg at 07/17/17 0853  . PHENobarbital (LUMINAL) tablet 64.8 mg  64.8 mg Oral Daily Pyreddy, Vivien RotaPavan, MD   64.8 mg at 07/17/17 0853  . [START ON 07/18/2017] PHENobarbital (LUMINAL) tablet 64.8 mg  64.8 mg Oral QPM Sainani, Rolly PancakeVivek J, MD      . phenytoin (DILANTIN) ER capsule 100 mg  100 mg Oral TID Ihor AustinPyreddy, Pavan, MD   Stopped at 07/17/17 1600  . polyethylene glycol (MIRALAX / GLYCOLAX) packet 17 g  17 g Oral Daily PRN Pyreddy, Pavan, MD      . sodium chloride flush (NS) 0.9 % injection 3 mL  3 mL Intravenous Q12H Pyreddy, Vivien RotaPavan, MD   3 mL at 07/17/17 0855  . sodium chloride flush (NS) 0.9 % injection 3 mL  3 mL Intravenous PRN Pyreddy, Pavan, MD      . warfarin (COUMADIN)  tablet 5 mg  5 mg Oral ONCE-1800 Olene FlossMaccia, Melissa D, RPH      . Warfarin - Pharmacist Dosing Inpatient   Does not apply q1800 Olene FlossMaccia, Melissa D, [facility]icenterRPH         Discharge Medications: Please see discharge summary for a list of discharge medications.  Relevant Imaging Results:  Relevant Lab Results:   Additional Information SSN [id number]  Ruthe Mannanandace  Joniqua Sidle, [place]

## 2017-07-17 NOTE — Clinical Social Work Note (Signed)
Clinical Social Work Assessment  Patient Details  Name: Brittany Hogan MRN: [id number] Date of Birth: Jul 31, 1935  Date of referral:  07/17/17               Reason for consult:  Facility Placement                Permission sought to share information with:  Case Manager, Customer service manager, Family Supports Permission granted to share information::  Yes, Verbal Permission Granted  Name::        Agency::     Relationship::     Contact Information:     Housing/Transportation Living arrangements for the past 2 months:  [place] of Information:  Patient Patient Interpreter Needed:  None Criminal Activity/Legal Involvement Pertinent to Current Situation/Hospitalization:  No - Comment as needed Significant Relationships:  Adult Children, Spouse Lives with:  Spouse Do you feel safe going back to the place where you live?  Yes Need for family participation in patient care:  Yes (Comment)  Care giving concerns:  Patient is from [facility] for short term rehab.    Social Worker assessment / plan:  CSW consulted for facility placement. CSW met with patient and husband Brittany Hogan at bedside. CSW introduced self and explained role. Patient reports that she has been at [facility] for short term rehab. She normally lives with her husband in [place]. CSW explained that therapy has been ordered for patient but has not been completed yet. Patient would prefer to return home with her husband at discharge but understands that she may need to return to [facility]. CSW will continue to follow for discharge planning.   Employment status:  Retired Nurse, adult PT Recommendations:  Not assessed at this time Information / Referral to community resources:  [place]  Patient/Family's Response to care:  Patient is accepting of plan   Patient/Family's Understanding of and Emotional Response to Diagnosis, Current Treatment, and  Prognosis:  Husband is accepting of plan and thanked CSW for assistance.   Emotional Assessment Appearance:  Appears stated age Attitude/Demeanor/Rapport:    Affect (typically observed):  Accepting, Pleasant Orientation:  Oriented to Self, Oriented to Place, Oriented to  Time Alcohol / Substance use:  Not Applicable Psych involvement (Current and /or in the community):  No (Comment)  Discharge Needs  Concerns to be addressed:  Discharge Planning Concerns Readmission within the last 30 days:  Yes Current discharge risk:  None Barriers to Discharge:  No Barriers Identified   Annamaria Boots, [place] 07/17/2017, 4:05 PM

## 2017-07-17 NOTE — Evaluation (Signed)
Physical Therapy Evaluation Patient Details Name: Brittany Hogan MRN: [id number] DOB: [birth year] Today's Date: 07/17/2017   History of Present Illness  82 y/o F who was here last week s/p fall with L intertrochanteric fx, s/p L intramedullary fixation.  She went to rehab and now returns with PE. PMH includes seizure, stroke, R shoulder surgery.   Clinical Impression  Pt was here last week after L hip ORIF and was only able to walk ~10 ft and apparently has not done much more than that at rehab since.  She is confused and weak during PT exam, but did show good effort with ~15 minutes of exercises and about ~10 minutes with therapuetic exercises apart from the exam.  Overall she had pain and confusion with most exercises and was not appropriate to try standing/transfer to recliner today secondary to mental status and weakness.  [name] sons present and eager to see her do as much as possible.    Follow Up Recommendations SNF    Equipment Recommendations  None recommended by PT    Recommendations for Other Services       Precautions / Restrictions Precautions Precautions: Fall Restrictions LLE Weight Bearing: Weight bearing as tolerated      Mobility  Bed Mobility Overal bed mobility: Needs Assistance Bed Mobility: Sit to Supine;Supine to Sit     Supine to sit: Max assist Sit to supine: Max assist   General bed mobility comments: Pt was able to get to maintain sitting balance relatively well once assisted into position.  Pt c/o pain (L hip) during transition to and from EOB  Transfers                 General transfer comment: Deferred this date.  Pt too weak and confused to safely attempt standing today.   Ambulation/Gait                Stairs            Wheelchair Mobility    Modified Rankin (Stroke Patients Only)       Balance Overall balance assessment: Needs assistance Sitting-balance support: Bilateral upper extremity supported;Feet  supported Sitting balance-Leahy Scale: Good Sitting balance - Comments: Pt actually did surprisingly well with maintaining sitting balance at EOB.  She had some posterior lean at times but ultimatley did well                                     Pertinent Vitals/Pain Pain Assessment: Faces Faces Pain Scale: Hurts even more Pain Location: L hip - pt initially very guarded with L hip movement, did do better with increased activity    Home Living Family/patient expects to be discharged to:: Skilled nursing facility Living Arrangements: Spouse/significant other Available Help at Discharge: Family;Available 24 hours/day Type of Home: House Home Access: Stairs to enter Entrance Stairs-Rails: Right Entrance Stairs-Number of Steps: 3 Home Layout: One level Home Equipment: Walker - 2 wheels;Walker - 4 wheels;Cane - single point;Bedside commode;Grab bars - tub/shower      Prior Function Level of Independence: Independent with assistive device(s)         Comments: Prior to hip fx she ambulated without AD in home but furniture surfed.  She did use RW in community.  Several falls in the past 6 months.  Pt was independent with bathing, dressing.   Pt has been very limited since.     Hand Dominance  Extremity/Trunk Assessment   Upper Extremity Assessment Upper Extremity Assessment: Generalized weakness;Overall [facility]FL for tasks assessed    Lower Extremity Assessment Lower Extremity Assessment: Generalized weakness;Overall [facility]FL for tasks assessed       Communication   Communication: (Pt confused during session, at times speaking off topic)  Cognition Arousal/Alertness: Lethargic Behavior During Therapy: Flat affect Overall Cognitive Status: Impaired/Different from baseline Area of Impairment: Memory                               General Comments: Pt struggled to say her name and could not state birthday.  Generally confused t/o the exam.       General Comments      Exercises General Exercises - Lower Extremity Ankle Circles/Pumps: AROM;AAROM;10 reps Quad Sets: Strengthening;15 reps Gluteal Sets: Strengthening;15 reps Short Arc Quad: AROM;AAROM;10 reps Long Arc Quad: AROM;Strengthening;10 reps Heel Slides: AAROM;10 reps Hip ABduction/ADduction: AAROM;AROM;10 reps   Assessment/Plan    PT Assessment Patient needs continued PT services  PT Problem List Decreased strength;Decreased range of motion;Decreased activity tolerance;Decreased balance;Decreased mobility;Decreased knowledge of use of DME;Decreased safety awareness;Pain       PT Treatment Interventions DME instruction;Gait training;Stair training;Functional mobility training;Therapeutic activities;Therapeutic exercise;Balance training;Neuromuscular re-education;Patient/family education;Wheelchair mobility training;Modalities    PT Goals (Current goals can be found in the Care Plan section)  Acute Rehab PT Goals Patient Stated Goal: get stronger and walk PT Goal Formulation: With patient/family Time For Goal Achievement: 07/31/17 Potential to Achieve Goals: Fair    Frequency Min 2X/week   Barriers to discharge        Co-evaluation               AM-PAC PT "6 Clicks" Daily Activity  Outcome Measure Difficulty turning over in bed (including adjusting bedclothes, sheets and blankets)?: Unable Difficulty moving from lying on back to sitting on the side of the bed? : Unable Difficulty sitting down on and standing up from a chair with arms (e.g., wheelchair, bedside commode, etc,.)?: Unable Help needed moving to and from a bed to chair (including a wheelchair)?: Total Help needed walking in hospital room?: Total Help needed climbing 3-5 steps with a railing? : Total 6 Click Score: 6    End of Session Equipment Utilized During Treatment: Gait belt Activity Tolerance: Patient limited by pain Patient left: with bed alarm set;with call bell/phone within  reach   PT Visit Diagnosis: Pain;Unsteadiness on feet (R26.81);Other abnormalities of gait and mobility (R26.89);History of falling (Z91.81);Muscle weakness (generalized) (M62.81);Difficulty in walking, not elsewhere classified (R26.2) Pain - Right/Left: Left Pain - part of body: Hip    Time: 1630-1706 PT Time Calculation (min) (ACUTE ONLY): 36 min   Charges:   PT Evaluation $PT Eval Low Complexity: 1 Low PT Treatments $Therapeutic Exercise: 8-22 mins $Therapeutic Activity: 8-22 mins   PT G Codes:        Malachi ProGalen R Jaisha Villacres, DPT 07/17/2017, 5:51 PM

## 2017-07-17 NOTE — Progress Notes (Signed)
[facility] [place] [facility]   PATIENT NAME: Brittany Hogan    MR#:  [id number]  DATE OF BIRTH:  [birth year]  SUBJECTIVE:   Patient remains somewhat lethargic/encephalopathic.  Respiratory status is stable.  Family is at bedside.  No other acute events overnight.  REVIEW OF SYSTEMS:    Review of Systems  Unable to perform ROS: Mental acuity    Nutrition: Heart Healthy Tolerating Diet: Yes Tolerating PT: Await Eval.   DRUG ALLERGIES:  No Known Allergies  VITALS:  Blood pressure 110/61, pulse 83, temperature 98.5 F (36.9 C), temperature source Oral, resp. rate 18, height 5\' 2"  (1.575 m), weight 77.6 kg (171 lb 1 oz), SpO2 93 %.  PHYSICAL EXAMINATION:   Physical Exam  GENERAL:  82 y.o.-year-old patient lying in bed lethargic but follows simple commands.  EYES: Pupils equal, round, reactive to light. No scleral icterus. Extraocular muscles intact.  HEENT: Head atraumatic, normocephalic. Oropharynx and nasopharynx clear.  NECK:  Supple, no jugular venous distention. No thyroid enlargement, no tenderness.  LUNGS: Normal breath sounds bilaterally, no wheezing, rales, rhonchi. No use of accessory muscles of respiration. Poor Resp. effort CARDIOVASCULAR: S1, S2 normal. No murmurs, rubs, or gallops.  ABDOMEN: Soft, nontender, nondistended. Bowel sounds present. No organomegaly or mass.  EXTREMITIES: No cyanosis, clubbing, b/l LE edema 1+    NEUROLOGIC: Cranial nerves II through XII are intact. No focal Motor or sensory deficits b/l.  Globally weak PSYCHIATRIC: The patient is alert and oriented x 1.  SKIN: No obvious rash, lesion, or ulcer.    LABORATORY PANEL:   CBC Recent Labs  Lab 07/17/17 0533  WBC 15.0*  HGB 7.6*  HCT 22.5*  PLT 321   ------------------------------------------------------------------------------------------------------------------  Chemistries  Recent Labs  Lab 07/15/17 1349  07/17/17 0533  NA 129*   < > 131*  K 4.1   <  > 3.0*  CL 92*   < > 97*  CO2 23   < > 24  GLUCOSE 136*   < > 105*  BUN 28*   < > 22*  CREATININE 1.00   < > 0.70  CALCIUM 7.7*   < > 7.2*  AST 62*  --   --   ALT 56*  --   --   ALKPHOS 102  --   --   BILITOT 1.5*  --   --    < > = values in this interval not displayed.   ------------------------------------------------------------------------------------------------------------------  Cardiac Enzymes Recent Labs  Lab 07/16/17 1007  TROPONINI 2.32*   ------------------------------------------------------------------------------------------------------------------  RADIOLOGY:  Ct Angio Chest Pe W And/or Wo Contrast  Result Date: 07/15/2017 CLINICAL DATA:  Respiratory distress. EXAM: CT ANGIOGRAPHY CHEST WITH CONTRAST TECHNIQUE: Multidetector CT imaging of the chest was performed using the standard protocol during bolus administration of intravenous contrast. Multiplanar CT image reconstructions and MIPs were obtained to evaluate the vascular anatomy. CONTRAST:  75mL ISOVUE-370 IOPAMIDOL (ISOVUE-370) INJECTION 76% COMPARISON:  Radiograph of same day. FINDINGS: Cardiovascular: Linear filling defect is seen in lower lobe branches of right pulmonary artery consistent with acute pulmonary embolus. RV/LV ratio of 1.7 is noted suggesting right heart strain. Atherosclerosis of thoracic aorta is noted without aneurysm formation. Mediastinum/Nodes: Large hiatal hernia is noted. Thyroid gland is unremarkable. No mediastinal adenopathy is noted. Lungs/Pleura: No pneumothorax or pleural effusion is noted. Right lung is clear. 6 mm nodule is noted in left lower lobe. Upper Abdomen: No acute abnormality. Musculoskeletal: No chest wall abnormality. No acute or significant osseous  findings. Review of the MIP images confirms the above findings. IMPRESSION: Acute pulmonary embolus seen in lower lobe branch of right pulmonary artery. Positive for acute PE with CT evidence of right heart strain (RV/LV Ratio =  1.7) consistent with at least submassive (intermediate risk) PE. The presence of right heart strain has been associated with an increased risk of morbidity and mortality. Please activate Code PE by paging [phone]. Critical Value/emergent results were called by telephone at the time of interpretation on 07/15/2017 at 3:56 pm to Dr. Willy EddyPATRICK ROBINSON , who verbally acknowledged these results. Large sliding-type hiatal hernia. 6 mm left lower lobe nodule is noted. Non-contrast chest CT at 6-12 months is recommended. If the nodule is stable at time of repeat CT, then future CT at 18-24 months (from today's scan) is considered optional for low-risk patients, but is recommended for high-risk patients. This recommendation follows the consensus statement: Guidelines for Management of Incidental Pulmonary Nodules Detected on CT Images: From the Fleischner Society 2017; Radiology 2017; 284:228-243. Aortic Atherosclerosis (ICD10-I70.0). Electronically Signed   By: Lupita RaiderJames  Green Jr, M.D.   On: 07/15/2017 15:56   [place] Venous Img Lower Bilateral  Result Date: 07/16/2017 CLINICAL DATA:  Right lower extremity pain and edema. History of pulmonary embolism. History of left hip surgery approximately 1 week ago. Evaluate for acute or chronic DVT. EXAM: BILATERAL LOWER EXTREMITY VENOUS DOPPLER ULTRASOUND TECHNIQUE: Gray-scale sonography with graded compression, as well as color Doppler and duplex ultrasound were performed to evaluate the lower extremity deep venous systems from the level of the common femoral vein and including the common femoral, femoral, profunda femoral, popliteal and calf veins including the posterior tibial, peroneal and gastrocnemius veins when visible. The superficial great saphenous vein was also interrogated. Spectral Doppler was utilized to evaluate flow at rest and with distal augmentation maneuvers in the common femoral, femoral and popliteal veins. COMPARISON:  None. FINDINGS: RIGHT LOWER EXTREMITY Common  Femoral Vein: No evidence of thrombus. Normal compressibility, respiratory phasicity and response to augmentation. Saphenofemoral Junction: No evidence of thrombus. Normal compressibility and flow on color Doppler imaging. Profunda Femoral Vein: No evidence of thrombus. Normal compressibility and flow on color Doppler imaging. Femoral Vein: No evidence of thrombus. Normal compressibility, respiratory phasicity and response to augmentation. Popliteal Vein: There is mixed echogenic expansile thrombus within the right popliteal vein (images 23 through 25), 27 to 29. Calf Veins: There is hypoechoic occlusive thrombus within both paired right posterior tibial veins. Peroneal veins are suboptimally imaged. Superficial Great Saphenous Vein: No evidence of thrombus. Normal compressibility. Venous Reflux:  None. Other Findings: There is hypoechoic thrombus seen within the proximal aspect the right lesser saphenous vein (image 26). LEFT LOWER EXTREMITY Common Femoral Vein: No evidence of thrombus. Normal compressibility, respiratory phasicity and response to augmentation. Saphenofemoral Junction: No evidence of thrombus. Normal compressibility and flow on color Doppler imaging. Profunda Femoral Vein: No evidence of thrombus. Normal compressibility and flow on color Doppler imaging. Femoral Vein: No evidence of thrombus. Normal compressibility, respiratory phasicity and response to augmentation. Popliteal Vein: No evidence of thrombus. Normal compressibility, respiratory phasicity and response to augmentation. Calf Veins: No evidence of thrombus. Normal compressibility and flow on color Doppler imaging. Superficial Great Saphenous Vein: No evidence of thrombus. Normal compressibility. Venous Reflux:  None. Other Findings:  None. IMPRESSION: 1. Age-indeterminate, though potentially acute, hypoechoic occlusive DVT within the right popliteal vein. 2. Age-indeterminate, though potentially acute, hypoechoic occlusive SVT within the  right lesser saphenous vein. 3. No evidence of acute  or chronic DVT within the left lower extremity. Electronically Signed   By: Simonne Come M.D.   On: 07/16/2017 10:39   Dg Chest Portable 1 View  Result Date: 07/15/2017 CLINICAL DATA:  82 year old female with respiratory distress. Initial encounter. EXAM: PORTABLE CHEST 1 VIEW COMPARISON:  04/08/2010 chest x-ray. FINDINGS: Mild pulmonary vascular congestion without frank pulmonary edema. Basilar subsegmental atelectasis greater on left Heart size top-normal to slightly enlarged. Calcified aorta. No acute osseous abnormality noted. IMPRESSION: Poor inspiratory portable exam. Mild pulmonary vascular congestion without frank pulmonary edema. Basilar subsegmental atelectasis greater on left. Heart size top-normal slightly enlarged. Aortic Atherosclerosis (ICD10-I70.0). Electronically Signed   By: Lacy Duverney M.D.   On: 07/15/2017 14:12     ASSESSMENT AND PLAN:   82 year old female with past medical history of seizures, previous CVA, recent left hip replacement who was at a nursing home presents to the hospital due to shortness of breath and respiratory failure noted to have a pulmonary embolism.  1.  Acute respiratory failure with hypoxia- secondary to pulmonary embolism. -Initially patient was admitted to the ICU on BiPAP, now weaned off of it.  Remains on nasal cannula oxygen. - will switch from Heparin gtt to Lovenox & Coumadin today.  Cannot use DOAC due to interaction with Dilantin/Phenobarb.  2.  Acute pulmonary embolism-this is a provoked VTE given the recent left hip surgery. - Patient's Doppler of lower extremity shows a subacute deep DVT of the right popliteal and saphenous vein. No need for IVC filter.  -We will switch from heparin drip to Lovenox/Coumadin.  Cannot use NOAC due to drug interaction with Dilantin/Phenobarb.   3.  Altered mental status/encephalopathy- multifactorial and related to dementia with mild sundowning, possibly  elevated phenobarbital level. no acute seizures, no acute infectious source. -Patient continues to be lethargic.   Discussed with neurology and will decrease her dose of Phenobarb and hold her evening dose.  Repeat level in the morning. - follow mental status  4.  History of seizures- cont. Dilantin, Phenobarb. Lower Phenobarb dose due to elevated level.  - repeat Phenobarb level in a.m.  Await Neuro input.    5.  Elevated troponin- suspected to be secondary to supply demand ischemia from underlying pulmonary embolism. -Patient's echocardiogram showing normal ejection fraction with no wall motion abnormalities.  Appreciate cardiology input no plans for acute intervention at this time.  6.  Hypothyroidism-continue Synthroid.  7.  Depression-continue Celexa.  Will DC telemetry, get a physical therapy evaluation.  Possible discharge to rehab in the next 1 to 2 days.  All the records are reviewed and case discussed with Care Management/Social Worker. Management plans discussed with the patient, family and they are in agreement.  CODE STATUS: Full code  DVT Prophylaxis: Heparin drip  TOTAL TIME TAKING CARE OF THIS PATIENT: 30 minutes.   POSSIBLE D/C IN 1-2 DAYS, DEPENDING ON CLINICAL CONDITION.   Houston Siren M.D on 07/17/2017 at 1:52 PM  Between 7am to 6pm - Pager - [phone]  After 6pm go to [email or web address] - Social research officer, government  [facility] [place] [facility]  Office  [phone]  CC: Primary care physician; Jaclyn Shaggy, MD

## 2017-07-17 NOTE — Progress Notes (Signed)
ANTICOAGULATION CONSULT NOTE   Pharmacy Consult for heparin Indication: pulmonary embolus  No Known Allergies  Patient Measurements: Height: 5\' 2"  (157.5 cm) Weight: 171 lb 1 oz (77.6 kg) IBW/kg (Calculated) : 50.1 Heparin Dosing Weight: 66kg  Vital Signs: Temp: 98.7 F (37.1 C) (06/06 0424) Temp Source: Oral (06/06 0424) BP: 106/72 (06/06 0424) Pulse Rate: 82 (06/06 0424)  Labs: Recent Labs    07/15/17 1349 07/15/17 1712 07/15/17 2110  07/16/17 0333 07/16/17 1007 07/16/17 2111 07/17/17 0533  HGB 8.8*  --  8.6*  --  7.9*  --   --  7.6*  HCT 26.1*  --  25.5*  --  23.7*  --   --  22.5*  PLT 296  --  289  --  281  --   --  321  APTT  --  109*  --   --   --   --   --   --   LABPROT  --  16.6*  --   --   --   --   --   --   INR  --  1.35  --   --   --   --   --   --   HEPARINUNFRC  --   --   --    < >  --  0.23* 0.39 0.31  CREATININE 1.00  --   --   --  0.94  --   --  0.70  TROPONINI 1.41*  --  2.18*  --  2.45* 2.32*  --   --    < > = values in this interval not displayed.    Estimated Creatinine Clearance: 55 mL/min (by C-G formula based on SCr of 0.7 mg/dL).   Medical History: Past Medical History:  Diagnosis Date  . Seizures (HCC)   . Stroke [facility](HCC)     Assessment: 82yo female with acute PE on no anticoagulants PTA  Goal of Therapy:  Heparin level 0.3-0.7 units/ml Monitor platelets by anticoagulation protocol: Yes   Plan:  06/05 @ 0125 HL 0.38 therapeutic. Will continue rate @ 1100 units/hr and will recheck anti-Xa @ 0900. Will f/u on CBC w/ am labs.  06/05  HL @ 21:11 = 0.39  06/06 @ 0500 HL 0.31 therapeutic. Will continue current rate and will recheck w/ am labs.  Thomasene Rippleavid Twanisha Foulk, PharmD, BCPS Clinical Pharmacist 07/17/2017

## 2017-07-18 DIAGNOSIS — R410 Disorientation, unspecified: Secondary | ICD-10-CM

## 2017-07-18 LAB — CBC
HEMATOCRIT: 22.7 % — AB (ref 35.0–47.0)
HEMOGLOBIN: 7.6 g/dL — AB (ref 12.0–16.0)
MCH: 30.6 pg (ref 26.0–34.0)
MCHC: 33.3 g/dL (ref 32.0–36.0)
MCV: 91.9 fL (ref 80.0–100.0)
Platelets: 370 10*3/uL (ref 150–440)
RBC: 2.47 MIL/uL — AB (ref 3.80–5.20)
RDW: 14.3 % (ref 11.5–14.5)
WBC: 11.3 10*3/uL — ABNORMAL HIGH (ref 3.6–11.0)

## 2017-07-18 LAB — IRON AND TIBC
IRON: 22 ug/dL — AB (ref 28–170)
Saturation Ratios: 9 % — ABNORMAL LOW (ref 10.4–31.8)
TIBC: 241 ug/dL — ABNORMAL LOW (ref 250–450)
UIBC: 219 ug/dL

## 2017-07-18 LAB — PROTIME-INR
INR: 1.22
Prothrombin Time: 15.3 seconds — ABNORMAL HIGH (ref 11.4–15.2)

## 2017-07-18 LAB — RETICULOCYTES
RBC.: 2.47 MIL/uL — ABNORMAL LOW (ref 3.80–5.20)
RETIC COUNT ABSOLUTE: 212.4 10*3/uL — AB (ref 19.0–183.0)
Retic Ct Pct: 8.6 % — ABNORMAL HIGH (ref 0.4–3.1)

## 2017-07-18 LAB — POTASSIUM: Potassium: 3.3 mmol/L — ABNORMAL LOW (ref 3.5–5.1)

## 2017-07-18 LAB — FERRITIN: FERRITIN: 54 ng/mL (ref 11–307)

## 2017-07-18 LAB — PHENOBARBITAL LEVEL: Phenobarbital: 42.6 ug/mL — ABNORMAL HIGH (ref 15.0–30.0)

## 2017-07-18 LAB — FOLATE: Folate: 19.9 ng/mL (ref >5.9)

## 2017-07-18 LAB — VITAMIN B12: Vitamin B-12: 318 pg/mL (ref 180–914)

## 2017-07-18 MED ORDER — POTASSIUM CHLORIDE 20 MEQ PO PACK
20.0000 meq | PACK | Freq: Two times a day (BID) | ORAL | Status: AC
  Administered 2017-07-18 – 2017-07-19 (×4): 20 meq via ORAL
  Filled 2017-07-18 (×4): qty 1

## 2017-07-18 MED ORDER — SODIUM CHLORIDE 0.9 % IV SOLN
200.0000 mg | Freq: Once | INTRAVENOUS | Status: AC
  Administered 2017-07-18: 200 mg via INTRAVENOUS
  Filled 2017-07-18: qty 10

## 2017-07-18 MED ORDER — WARFARIN SODIUM 5 MG PO TABS
5.0000 mg | ORAL_TABLET | Freq: Once | ORAL | Status: AC
  Administered 2017-07-18: 5 mg via ORAL
  Filled 2017-07-18: qty 1

## 2017-07-18 NOTE — Progress Notes (Signed)
Physical Therapy Treatment Patient Details Name: Brittany Hogan MRN: [id number] DOB: [birth year] Today's Date: 07/18/2017    History of Present Illness 82 y/o F who was here last week s/p fall with L intertrochanteric fx, s/p L intramedullary fixation.  She went to rehab and now returns with PE. PMH includes seizure, stroke, R shoulder surgery.     PT Comments    Pt more awake today but still with confusion and at times difficulty fully following instruction.  She was pleasant t/o the session and did show good effort with all tasks asked of her.  Session in portions as nursing was giving meds and she needed to try a BM.  Pt had a very limited mobility with standing and ultimately was not able to truly take any steps but we did manage (with heavy assist from PT) to get to recliner.  Family present and eager to see her do as much as possible; pt did well in sitting and showed improved tolerance with exercise but mobility remains highly limited. O2 did remain in the mid 90s t/o the session despite some coughing and shortness of breath.    Follow Up Recommendations  SNF     Equipment Recommendations  None recommended by PT    Recommendations for Other Services       Precautions / Restrictions Precautions Precautions: Fall Restrictions LLE Weight Bearing: Weight bearing as tolerated    Mobility  Bed Mobility Overal bed mobility: Needs Assistance Bed Mobility: Supine to Sit     Supine to sit: Max assist     General bed mobility comments: Pt showed good balance once up to EOB, but needed very heavy assist to get up to sitting  Transfers Overall transfer level: Needs assistance Equipment used: Rolling walker (2 wheeled) Transfers: Sit to/from Stand Sit to Stand: Max assist;Total assist         General transfer comment: Pt needed very heavy to get to standing and get hips forward.  Once up she showed occasional limited ability to maintain static standing but still needed  consistent assist to keep from sitting back down at EOB  Ambulation/Gait             General Gait Details: Pt unable to do any real ambulation.  She was unable to clear feet to steps, but did manage a few very small, very labored, very limited heel shifts.  Pt needed heavy assist to get to recliner and safely sit.   Stairs             Wheelchair Mobility    Modified Rankin (Stroke Patients Only)       Balance Overall balance assessment: Needs assistance Sitting-balance support: Bilateral upper extremity supported;Feet supported Sitting balance-Leahy Scale: Good     Standing balance support: Bilateral upper extremity supported Standing balance-Leahy Scale: Zero                              Cognition Arousal/Alertness: Lethargic Behavior During Therapy: Flat affect Overall Cognitive Status: Impaired/Different from baseline                                 General Comments: Pt again generally confused, but able to participate and pleasant t/o the session      Exercises General Exercises - Lower Extremity Ankle Circles/Pumps: Strengthening;10 reps;AROM Quad Sets: Strengthening;10 reps Gluteal Sets: Strengthening;10 reps Long Arc  Quad: Strengthening;10 reps Heel Slides: Strengthening;10 reps Hip ABduction/ADduction: Strengthening;10 reps Hip Flexion/Marching: AROM;AAROM;10 reps    General Comments        Pertinent Vitals/Pain Pain Assessment: Faces Pain Score: 6  Pain Location: minimal/no pain at rest, inconsistent responses with L LE movement     Home Living                      Prior Function            PT Goals (current goals can now be found in the care plan section) Progress towards PT goals: Progressing toward goals    Frequency    Min 2X/week      PT Plan Current plan remains appropriate    Co-evaluation              AM-PAC PT "6 Clicks" Daily Activity  Outcome Measure  Difficulty  turning over in bed (including adjusting bedclothes, sheets and blankets)?: Unable Difficulty moving from lying on back to sitting on the side of the bed? : Unable Difficulty sitting down on and standing up from a chair with arms (e.g., wheelchair, bedside commode, etc,.)?: Unable Help needed moving to and from a bed to chair (including a wheelchair)?: Total Help needed walking in hospital room?: Total Help needed climbing 3-5 steps with a railing? : Total 6 Click Score: 6    End of Session Equipment Utilized During Treatment: Gait belt Activity Tolerance: Patient limited by pain Patient left: with call bell/phone within reach;with chair alarm set;with family/visitor present   PT Visit Diagnosis: Pain;Unsteadiness on feet (R26.81);Other abnormalities of gait and mobility (R26.89);History of falling (Z91.81);Muscle weakness (generalized) (M62.81);Difficulty in walking, not elsewhere classified (R26.2) Pain - Right/Left: Left Pain - part of body: Hip     Time: - 1438 - 1456 , 1511 - 1535 PT Time Calculation (min) (ACUTE ONLY): 42 min  Charges:  $Therapeutic Exercise: 8-22 mins $Therapeutic Activity: 23-37 mins                    G Codes:       Malachi ProGalen R Herminio Kniskern, DPT 07/18/2017, 5:33 PM

## 2017-07-18 NOTE — Consult Note (Signed)
ANTICOAGULATION CONSULT NOTE - Initial Consult  Pharmacy Consult for warfarin/lovenox Indication: pulmonary embolus  No Known Allergies  Patient Measurements: Height: 5\' 2"  (157.5 cm) Weight: 171 lb 1 oz (77.6 kg) IBW/kg (Calculated) : 50.1 Heparin Dosing Weight:   Vital Signs: Temp: 98.5 F (36.9 C) (06/07 0519) Temp Source: Oral (06/07 0519) BP: 119/70 (06/07 0519) Pulse Rate: 79 (06/07 0519)  Labs: Recent Labs    07/15/17 1349 07/15/17 1712 07/15/17 2110  07/16/17 0333 07/16/17 1007 07/16/17 2111 07/17/17 0533 07/18/17 0729  HGB 8.8*  --  8.6*  --  7.9*  --   --  7.6* 7.6*  HCT 26.1*  --  25.5*  --  23.7*  --   --  22.5* 22.7*  PLT 296  --  289  --  281  --   --  321 370  APTT  --  109*  --   --   --   --   --   --   --   LABPROT  --  16.6*  --   --   --   --   --   --  15.3*  INR  --  1.35  --   --   --   --   --   --  1.22  HEPARINUNFRC  --   --   --    < >  --  0.23* 0.39 0.31  --   CREATININE 1.00  --   --   --  0.94  --   --  0.70  --   TROPONINI 1.41*  --  2.18*  --  2.45* 2.32*  --   --   --    < > = values in this interval not displayed.    Estimated Creatinine Clearance: 55 mL/min (by C-G formula based on SCr of 0.7 mg/dL).   Medical History: Past Medical History:  Diagnosis Date  . Seizures (HCC)   . Stroke [facility])     Medications:  Scheduled:  . citalopram  20 mg Oral Daily  . enoxaparin (LOVENOX) injection  1 mg/kg Subcutaneous Q12H  . levothyroxine  25 mcg Oral QAC breakfast  . mouth rinse  15 mL Mouth Rinse BID  . pantoprazole  40 mg Oral Daily  . phenobarbital  64.8 mg Oral Daily  . phenobarbital  64.8 mg Oral QPM  . phenytoin  100 mg Oral TID  . sodium chloride flush  3 mL Intravenous Q12H  . warfarin  5 mg Oral ONCE-1800  . Warfarin - Pharmacist Dosing Inpatient   Does not apply q1800    Assessment: Patient is a 82 year old female found to have a provoked PE/DVT- recent hip surgery. Pharmacy originally consulted to dose apixaban,  however pt is on phenobarbitol and phenytoin which are both strong inducers of CYP3A4- decreasing the concentration of apixaban. Therefore its strongly recommended that it not be used. Unfortunately, xarelto has the same interaction, pradaxa is induced by phenytoin and Savasya, although to the lesser degree is induced as well. After discussion with Dr. Carla Drape, I think the best option is warfarin as levels can be monitored for efficacy.   Warfarin does still have drug interactions with phenytoin (increases warfarin effect) and pheyobarbital (decreases warfarin effect) the dose can be adjusted and levels can be easily monitored. Will bridge with subq lovenx until INR therapeutic x2  6/4 INR 1.35 6/6                Warfarin  5mg  6/7 INR 1.22  Goal of Therapy:  INR 2-3 Monitor platelets by anticoagulation protocol: Yes   Plan:  It is recommended per protocol to start with < 5mg  daily per algorithm due to interaction with phenytoin, however due to decreased efficacy with phenyobaritol, I have chosen to start with 5mg  tonight. Lovenox 1mg /kg BID. Will check INR and CBC in the AM. Hgb on the lower end, will monitor closely.   6/7 Hgb stable. I will give another 5mg  tonight. Continue lovenox 1mg /kg BID. INR/ CBC in the AM  Carlin Attridge D Kevork Joyce, Pharm.D, BCPS Clinical Pharmacist 07/18/2017,9:14 AM

## 2017-07-18 NOTE — Consult Note (Signed)
Reason for Consult: confusion  Referring Physician: Dr. Cherlynn Kaiser   CC: confusion   HPI: Brittany Hogan is an 82 y.o. female with a known history of seizure disorder, CVA presented to the emergency room from facility.  Patient was discharged from our hospital last week after she had a left hip surgery.  [facility] was [facility] with DVT prophylaxis with oral aspirin 320 family twice daily. Presented with confusion and disorientation. Acute pulmonary embolism was noted in the right pulmonary artery.  Patient started on IV heparin drip for anticoagulation.   Past Medical History:  Diagnosis Date  . Seizures (HCC)   . Stroke [facility])     Past Surgical History:  Procedure Laterality Date  . APPENDECTOMY    . BREAST BIOPSY Right    neg  . CHOLECYSTECTOMY N/A 09/27/2014   Procedure: LAPAROSCOPIC CHOLECYSTECTOMY with cholangiogram;  Surgeon: Kieth Brightly, MD;  Location: [facility] ORS;  Service: General;  Laterality: N/A;  . INTRAMEDULLARY (IM) NAIL INTERTROCHANTERIC Left 07/08/2017   Procedure: INTRAMEDULLARY (IM) NAIL INTERTROCHANTRIC;  Surgeon: Juanell Fairly, MD;  Location: [facility] ORS;  Service: Orthopedics;  Laterality: Left;  . SHOULDER SURGERY Right     History reviewed. No pertinent family history.  Social History:  reports that she has never smoked. She has never used smokeless tobacco. She reports that she does not drink alcohol or use drugs.  No Known Allergies  Medications: I have reviewed the patient's current medications.  ROS: Unable to obtain due to confusion   Physical Examination: Blood pressure 119/70, pulse 79, temperature 98.5 F (36.9 C), temperature source Oral, resp. rate 19, height 5\' 2"  (1.575 m), weight 171 lb 1 oz (77.6 kg), SpO2 93 %.  Able to tell me name Follows minimal commands Has generalized weakness   Laboratory Studies:   Basic Metabolic Panel: Recent Labs  Lab 07/15/17 1349 07/16/17 0333 07/17/17 0533  NA 129* 131* 131*   K 4.1 3.6 3.0*  CL 92* 95* 97*  CO2 23 25 24   GLUCOSE 136* 122* 105*  BUN 28* 25* 22*  CREATININE 1.00 0.94 0.70  CALCIUM 7.7* 7.4* 7.2*    Liver Function Tests: Recent Labs  Lab 07/15/17 1349  AST 62*  ALT 56*  ALKPHOS 102  BILITOT 1.5*  PROT 6.6  ALBUMIN 3.1*   No results for input(s): LIPASE, AMYLASE in the last 168 hours. No results for input(s): AMMONIA in the last 168 hours.  CBC: Recent Labs  Lab 07/15/17 1349 07/15/17 2110 07/16/17 0333 07/17/17 0533 07/18/17 0729  WBC 16.7* 17.7* 15.0* 15.0* 11.3*  NEUTROABS 13.4*  --   --   --   --   HGB 8.8* 8.6* 7.9* 7.6* 7.6*  HCT 26.1* 25.5* 23.7* 22.5* 22.7*  MCV 93.4 93.0 92.5 93.2 91.9  PLT 296 289 281 321 370    Cardiac Enzymes: Recent Labs  Lab 07/15/17 1349 07/15/17 2110 07/16/17 0333 07/16/17 1007  TROPONINI 1.41* 2.18* 2.45* 2.32*    BNP: Invalid input(s): POCBNP  CBG: Recent Labs  Lab 07/15/17 2055  GLUCAP 124*    Microbiology: Results for orders placed or performed during the hospital encounter of 07/15/17  Blood culture (routine x 2)     Status: None (Preliminary result)   Collection Time: 07/15/17  1:49 PM  Result Value Ref Range Status   Specimen Description BLOOD LEFT AC  Final   Special Requests   Final    BOTTLES DRAWN AEROBIC AND ANAEROBIC Blood Culture adequate volume  Culture   Final    NO GROWTH 3 DAYS Performed at [facility], [address] [place], [place] [address]    Report Status PENDING  Incomplete  Blood culture (routine x 2)     Status: None (Preliminary result)   Collection Time: 07/15/17  3:20 PM  Result Value Ref Range Status   Specimen Description BLOOD RIGHT ANTECUBITAL  Final   Special Requests   Final    BOTTLES DRAWN AEROBIC AND ANAEROBIC Blood Culture adequate volume   Culture   Final    NO GROWTH 3 DAYS Performed at [facility], [address] [place], [place] [address]    Report Status PENDING  Incomplete  MRSA PCR  Screening     Status: None   Collection Time: 07/15/17  8:55 PM  Result Value Ref Range Status   MRSA by PCR NEGATIVE NEGATIVE Final    Comment:        The GeneXpert MRSA Assay (FDA approved for NASAL specimens only), is one component of a comprehensive MRSA colonization surveillance program. It is not intended to diagnose MRSA infection nor to guide or monitor treatment for MRSA infections. Performed at [facility], [address] [place], [place] [address]     Coagulation Studies: Recent Labs    07/15/17 1712 07/18/17 0729  LABPROT 16.6* 15.3*  INR 1.35 1.22    Urinalysis:  Recent Labs  Lab 07/15/17 1911  COLORURINE YELLOW*  LABSPEC 1.014  PHURINE 6.0  GLUCOSEU NEGATIVE  HGBUR NEGATIVE  BILIRUBINUR NEGATIVE  KETONESUR NEGATIVE  PROTEINUR NEGATIVE  NITRITE NEGATIVE  LEUKOCYTESUR NEGATIVE    Lipid Panel:     Component Value Date/Time   CHOL 151 06/11/2011 0456   TRIG 82 06/11/2011 0456   HDL 63 (H) 06/11/2011 0456   VLDL 16 06/11/2011 0456   LDLCALC 72 06/11/2011 0456    HgbA1C: No results found for: HGBA1C  Urine Drug Screen:  No results found for: LABOPIA, COCAINSCRNUR, LABBENZ, AMPHETMU, THCU, LABBARB  Alcohol Level: No results for input(s): ETH in the last 168 hours.   Imaging: No results found.   Assessment/Plan:   82 y.o. female with a known history of seizure disorder, CVA presented to the emergency room from facility.  Patient was discharged from our hospital last week after she had a left hip surgery.  She was [facility] to [facility] with DVT prophylaxis with oral aspirin 320 family twice daily. Presented with confusion and disorientation. Acute pulmonary embolism was noted in the right pulmonary artery.  Patient started on IV heparin drip for anticoagulation.  - Pt does see neurology as per husband and last visit bout 30 days ago.  - On dilantin 100 TID and phenobarb now 64.8 which was held yesterday and decreased  night dose.   - Still high phenobarb level at 42.6 - will d/c phenobarbital and daily levels. Please restart when it is below 30.   07/18/2017, 10:52 AM

## 2017-07-18 NOTE — Evaluation (Signed)
Clinical/Bedside Swallow Evaluation Patient Details  Name: Brittany Hogan MRN: [id number] Date of Birth: 1935-10-05  Today's Date: 07/18/2017 Time: SLP Start Time (ACUTE ONLY): 1245 SLP Stop Time (ACUTE ONLY): 1345 SLP Time Calculation (min) (ACUTE ONLY): 60 min  Past Medical History:  Past Medical History:  Diagnosis Date  . Seizures (HCC)   . Stroke [facility](HCC)    Past Surgical History:  Past Surgical History:  Procedure Laterality Date  . APPENDECTOMY    . BREAST BIOPSY Right    neg  . CHOLECYSTECTOMY N/A 09/27/2014   Procedure: LAPAROSCOPIC CHOLECYSTECTOMY with cholangiogram;  Surgeon: Kieth BrightlySeeplaputhur G Sankar, MD;  Location: [facility] ORS;  Service: General;  Laterality: N/A;  . INTRAMEDULLARY (IM) NAIL INTERTROCHANTERIC Left 07/08/2017   Procedure: INTRAMEDULLARY (IM) NAIL INTERTROCHANTRIC;  Surgeon: Juanell FairlyKrasinski, Kevin, MD;  Location: [facility] ORS;  Service: Orthopedics;  Laterality: Left;  . SHOULDER SURGERY Right    HPI:  Pt is a 82 y.o. female with a known history of seizure disorder, CVA, GERD on PPI who presented to the emergency room from [facility].  Patient was discharged from our hospital last week after she had a left hip surgery.  She was discharged to [facility] with DVT prophylaxis with oral aspirin 320 family twice daily.  She was found to be short of breath this morning.  In regard to family members she was also a little lethargic and confused yesterday.  She was found to be hypoxic with O2 saturation of 80% she was brought to the emergency room.  Patient was worked up with CTA chest in the emergency room.  Acute pulmonary embolism was noted in the right pulmonary artery.  Patient started on IV heparin drip for anticoagulation.  Patient was put on BiPAP for respiratory distress. Remains lethargic/encephalopathic but follows some commands.  Phenobarbital level is still elevated today.  As per neurology will hold phenobarbital for now. NSG reported some coughing after  eating so MD ordered ST services.    Assessment / Plan / Recommendation Clinical Impression  Pt appears to present w/ grossly adequate oropharyngeal phase swallow function w/ reduced risk for aspiration when following general aspiration precautions during oral intake. However, pt has had extended illness post Hip Fx and has been readmitted to the hospital w/ a PE. Pt also has GERD at baseline - any Pulmonary or Esophageal dysmotility can increase risk for dysphagia including Esophageal regurgitation which can then impact the respiratory status further. She also requires assistance w/ feeding d/t overall weakness. Pt was positioned upright(no hip discomfort reported by pt) and given trials of thin liquids via cup and straw, purees and softened solids w/ assistance - pt was able to help hold the cup to drink. Pt consumed trials w/ no immediate, overt s/s of aspiration noted, no decline in vocal quality or respiratory status during/post trials - pt did seem to hold breath min then have increased exhalation post the swallows. [name] oral phase was grossly wfl w/ trials. W/ trials of increased texture(soft solids), trials were broken small and moistened well to allow for easier mastication; pt cleared adequately given time.  Pt required assistance w/ feeding.  Of note, pt exhibited moderate belching ~6-7x during/post trials - recommend give time to allow for such to reduce Esophageal pressure while eating/drinking/swallowing. With family agreement, recommend a dysphagia level 3 style diet (cut meats for easier mastication) w/ Thin liquids - monitor straw use and discontinue if coughing noted; aspiration precautions; feeding support at all meals. Pills WHOLE in puree (  for safer swallowing during this acute time). Education given on food consistencies and ways to modify them; GERD and Reflux precautions; handouts given. NSG to reconsult if any new needs while admitted.  NSG informed.  SLP Visit Diagnosis: Dysphagia,  pharyngoesophageal phase (R13.14)(Esophageal phase dysmotility)    Aspiration Risk  (reduced from an oropharyngeal phase standpoint; GERD+)    Diet Recommendation  Regular diet w/ CUT meats, moistened foods; Thin liquids. General aspiration precautions. REFLUX precautions. Tray setup at meals.  Medication Administration: Whole meds with puree(for safer swallowing)    Other  Recommendations Recommended Consults: Consider GI evaluation(Dietician f/u) Oral Care Recommendations: Oral care BID;Staff/trained caregiver to provide oral care(d/t weakness) Other Recommendations: (n/a)   Follow up Recommendations None(at this time)      Frequency and Duration (n/a)  (n/a)       Prognosis Prognosis for Safe Diet Advancement: Fair(-Good) Barriers to Reach Goals: Cognitive deficits(baseline GERD)      Swallow Study   General Date of Onset: 07/15/17 HPI: Pt is a 82 y.o. female with a known history of seizure disorder, CVA, GERD on PPI who presented to the emergency room from [facility].  Patient was discharged from our hospital last week after she had a left hip surgery.  She was discharged to [facility] with DVT prophylaxis with oral aspirin 320 family twice daily.  She was found to be short of breath this morning.  In regard to family members she was also a little lethargic and confused yesterday.  She was found to be hypoxic with O2 saturation of 80% she was brought to the emergency room.  Patient was worked up with CTA chest in the emergency room.  Acute pulmonary embolism was noted in the right pulmonary artery.  Patient started on IV heparin drip for anticoagulation.  Patient was put on BiPAP for respiratory distress. Remains lethargic/encephalopathic but follows some commands.  Phenobarbital level is still elevated today.  As per neurology will hold phenobarbital for now. NSG reported some coughing after eating so MD ordered ST services.  Type of Study: Bedside Swallow  Evaluation Previous Swallow Assessment: none Diet Prior to this Study: Regular;Thin liquids Temperature Spikes Noted: No(wbc 11.3) Respiratory Status: Nasal cannula(2 liters) History of Recent Intubation: No Behavior/Cognition: Alert;Cooperative;Pleasant mood;Distractible;Requires cueing Oral Cavity Assessment: Within Functional Limits Oral Care Completed by SLP: Recent completion by staff Oral Cavity - Dentition: Adequate natural dentition;Missing dentition(few) Vision: Functional for self-feeding Self-Feeding Abilities: Able to feed self;Needs assist;Needs set up(overall weakness) Patient Positioning: Upright in bed Baseline Vocal Quality: Normal;Low vocal intensity Volitional Cough: Strong;Congested(min) Volitional Swallow: Able to elicit    Oral/Motor/Sensory Function Overall Oral Motor/Sensory Function: Within functional limits   Ice Chips Ice chips: Not tested   Thin Liquid Thin Liquid: Within functional limits Presentation: Cup;Self Fed;Straw(5 trials w/ each; self fed w/ support) Other Comments: tended to have quick breathing post breath holding during drinking    Nectar Thick Nectar Thick Liquid: Not tested   Honey Thick Honey Thick Liquid: Not tested   Puree Puree: Within functional limits Presentation: Spoon(5 trials)   Solid   GO   Solid: Within functional limits(w/ mech soft trial consistency) Presentation: Self Fed;Spoon(assisted; 4 trials) Other Comments: small, single pieces        Jerilynn Som, MS, CCC-SLP Watson,Katherine 07/18/2017,2:43 PM

## 2017-07-18 NOTE — Progress Notes (Signed)
[facility] [place] [facility]   PATIENT NAME: Brittany Hogan    MR#:  [id number]  DATE OF BIRTH:  1935/04/29  SUBJECTIVE:   Remains lethargic/encephalopathic but follows some commands.  Phenobarbital level is still elevated today.  As per neurology will hold phenobarbital for now.  Patient having some coughing after eating and therefore will get speech therapy evaluation as patient could be high aspiration risk.   [name] husband at bedside  REVIEW OF SYSTEMS:    Review of Systems  Unable to perform ROS: Mental acuity    Nutrition: Heart Healthy Tolerating Diet: Yes Tolerating PT: Eval noted.  DRUG ALLERGIES:  No Known Allergies  VITALS:  Blood pressure 120/68, pulse 76, temperature 98.4 F (36.9 C), temperature source Oral, resp. rate (!) 22, height 5\' 2"  (1.575 m), weight 77.6 kg (171 lb 1 oz), SpO2 97 %.  PHYSICAL EXAMINATION:   Physical Exam  GENERAL:  82 y.o.-year-old patient lying in bed lethargic but follows simple commands.  EYES: Pupils equal, round, reactive to light. No scleral icterus. Extraocular muscles intact.  HEENT: Head atraumatic, normocephalic. Oropharynx and nasopharynx clear.  NECK:  Supple, no jugular venous distention. No thyroid enlargement, no tenderness.  LUNGS: Normal breath sounds bilaterally, + upper airway wheezing, No rales, rhonchi. No use of accessory muscles of respiration. Poor Resp. effort CARDIOVASCULAR: S1, S2 normal. No murmurs, rubs, or gallops.  ABDOMEN: Soft, nontender, nondistended. Bowel sounds present. No organomegaly or mass.  EXTREMITIES: No cyanosis, clubbing, b/l LE edema 1+    NEUROLOGIC: Cranial nerves II through XII are intact. No focal Motor or sensory deficits b/l.  Globally weak PSYCHIATRIC: The patient is alert and oriented x 1.  SKIN: No obvious rash, lesion, or ulcer.    LABORATORY PANEL:   CBC Recent Labs  Lab 07/18/17 0729  WBC 11.3*  HGB 7.6*  HCT 22.7*  PLT 370    ------------------------------------------------------------------------------------------------------------------  Chemistries  Recent Labs  Lab 07/15/17 1349  07/17/17 0533 07/18/17 1039  NA 129*   < > 131*  --   K 4.1   < > 3.0* 3.3*  CL 92*   < > 97*  --   CO2 23   < > 24  --   GLUCOSE 136*   < > 105*  --   BUN 28*   < > 22*  --   CREATININE 1.00   < > 0.70  --   CALCIUM 7.7*   < > 7.2*  --   AST 62*  --   --   --   ALT 56*  --   --   --   ALKPHOS 102  --   --   --   BILITOT 1.5*  --   --   --    < > = values in this interval not displayed.   ------------------------------------------------------------------------------------------------------------------  Cardiac Enzymes Recent Labs  Lab 07/16/17 1007  TROPONINI 2.32*   ------------------------------------------------------------------------------------------------------------------  RADIOLOGY:  No results found.   ASSESSMENT AND PLAN:   82 year old female with past medical history of seizures, previous CVA, recent left hip replacement who was at a nursing home presents to the hospital due to shortness of breath and respiratory failure noted to have a pulmonary embolism.  1.  Acute respiratory failure with hypoxia- secondary to pulmonary embolism. -Initially patient was admitted to the ICU on BiPAP, now weaned off of it.  Remains on nasal cannula oxygen. - cont. Lovenox/coumadin.  Cannot use DOAC due to interaction with  Dilantin/Phenobarb.  2.  Acute pulmonary embolism-this is a provoked VTE given the recent left hip surgery. - Patient's Doppler of lower extremity shows a subacute deep DVT of the right popliteal and saphenous vein. No need for IVC filter.  - cont. Lovenox/Coumadin to goal INR of 2-3.   Cannot use NOAC due to drug interaction with Dilantin/Phenobarb.   3.  Altered mental status/encephalopathy- multifactorial and related to dementia with mild sundowning, elevated Phenobarbital level. no acute  seizures, no acute infectious source. -Initiate neurology input and will DC phenobarbital for now and resume when level is less than 30.  Continue Dilantin, follow mental status.  4.  History of seizures- cont. Dilantin, phenobarbital being held due to elevated level and its contributing to patient's mental status change.   5.  Elevated troponin- suspected to be secondary to supply demand ischemia from underlying pulmonary embolism. -Patient's echocardiogram showing normal ejection fraction with no wall motion abnormalities.  Appreciate cardiology input no plans for acute intervention at this time.  6.  Hypothyroidism-continue Synthroid.  7.  Depression-continue Celexa.  Appreciate PT eval and they recommend SNF/STR. Discharge once mental status improving.   All the records are reviewed and case discussed with Care Management/Social Worker. Management plans discussed with the patient, family and they are in agreement.  CODE STATUS: Full code  DVT Prophylaxis: Heparin drip  TOTAL TIME TAKING CARE OF THIS PATIENT: 30 minutes.   POSSIBLE D/C IN 1-2 DAYS, DEPENDING ON CLINICAL CONDITION.   Houston Siren M.D on 07/18/2017 at 1:06 PM  Between 7am to 6pm - Pager - [phone]  After 6pm go to [email or web address] - Social research officer, government  [facility] [facility]  Office  [phone]  CC: Primary care physician; Jaclyn Shaggy, MD

## 2017-07-18 NOTE — Care Management Important Message (Signed)
Important Message  Patient Details  Name: Brittany GauzeCarolyn S Bencomo MRN: [id number] Date of Birth: 04-Jul-1935   Medicare Important Message Given:  Yes    Olegario MessierKathy A Paizlie Klaus 07/18/2017, 10:15 AM

## 2017-07-19 LAB — BASIC METABOLIC PANEL
Anion gap: 8 (ref 5–15)
BUN: 13 mg/dL (ref 6–20)
CO2: 25 mmol/L (ref 22–32)
CREATININE: 0.65 mg/dL (ref 0.44–1.00)
Calcium: 7.2 mg/dL — ABNORMAL LOW (ref 8.9–10.3)
Chloride: 101 mmol/L (ref 101–111)
GFR calc non Af Amer: >60 mL/min (ref >60)
Glucose, Bld: 108 mg/dL — ABNORMAL HIGH (ref 65–99)
POTASSIUM: 3.9 mmol/L (ref 3.5–5.1)
SODIUM: 134 mmol/L — AB (ref 135–145)

## 2017-07-19 LAB — CBC
HCT: 24.1 % — ABNORMAL LOW (ref 35.0–47.0)
Hemoglobin: 8 g/dL — ABNORMAL LOW (ref 12.0–16.0)
MCH: 30.5 pg (ref 26.0–34.0)
MCHC: 33.3 g/dL (ref 32.0–36.0)
MCV: 91.4 fL (ref 80.0–100.0)
PLATELETS: 385 10*3/uL (ref 150–440)
RBC: 2.64 MIL/uL — ABNORMAL LOW (ref 3.80–5.20)
RDW: 14.5 % (ref 11.5–14.5)
WBC: 10.7 10*3/uL (ref 3.6–11.0)

## 2017-07-19 LAB — PROTIME-INR
INR: 1.86
PROTHROMBIN TIME: 21.3 s — AB (ref 11.4–15.2)

## 2017-07-19 LAB — PHENOBARBITAL LEVEL: PHENOBARBITAL: 38.6 ug/mL — AB (ref 15.0–30.0)

## 2017-07-19 MED ORDER — WARFARIN SODIUM 3 MG PO TABS
3.0000 mg | ORAL_TABLET | Freq: Once | ORAL | Status: AC
  Administered 2017-07-19: 3 mg via ORAL
  Filled 2017-07-19: qty 1

## 2017-07-19 NOTE — Consult Note (Signed)
ANTICOAGULATION CONSULT NOTE - Initial Consult  Pharmacy Consult for warfarin/lovenox Indication: pulmonary embolus  No Known Allergies  Patient Measurements: Height: 5\' 2"  (157.5 cm) Weight: 171 lb 1 oz (77.6 kg) IBW/kg (Calculated) : 50.1 Heparin Dosing Weight:   Vital Signs: Temp: 98 F (36.7 C) (06/08 0409) Temp Source: Oral (06/08 0409) BP: 115/54 (06/08 0409) Pulse Rate: 87 (06/08 0409)  Labs: Recent Labs    07/16/17 1007 07/16/17 2111  07/17/17 0533 07/18/17 0729 07/19/17 0406  HGB  --   --    < > 7.6* 7.6* 8.0*  HCT  --   --   --  22.5* 22.7* 24.1*  PLT  --   --   --  321 370 385  LABPROT  --   --   --   --  15.3* 21.3*  INR  --   --   --   --  1.22 1.86  HEPARINUNFRC 0.23* 0.39  --  0.31  --   --   CREATININE  --   --   --  0.70  --  0.65  TROPONINI 2.32*  --   --   --   --   --    < > = values in this interval not displayed.    Estimated Creatinine Clearance: 55 mL/min (by C-G formula based on SCr of 0.65 mg/dL).   Medical History: Past Medical History:  Diagnosis Date  . Seizures (HCC)   . Stroke [facility](HCC)     Medications:  Scheduled:  . citalopram  20 mg Oral Daily  . enoxaparin (LOVENOX) injection  1 mg/kg Subcutaneous Q12H  . levothyroxine  25 mcg Oral QAC breakfast  . mouth rinse  15 mL Mouth Rinse BID  . pantoprazole  40 mg Oral Daily  . phenytoin  100 mg Oral TID  . potassium chloride  20 mEq Oral BID  . sodium chloride flush  3 mL Intravenous Q12H  . Warfarin - Pharmacist Dosing Inpatient   Does not apply q1800    Assessment: Patient is a 82 year old female found to have a provoked PE/DVT- recent hip surgery. Pharmacy originally consulted to dose apixaban, however pt is on phenobarbitol and phenytoin which are both strong inducers of CYP3A4- decreasing the concentration of apixaban. Therefore its strongly recommended that it not be used. Unfortunately, xarelto has the same interaction, pradaxa is induced by phenytoin and Savasya, although  to the lesser degree is induced as well. After discussion with Dr. Carla DrapeSainai, I think the best option is warfarin as levels can be monitored for efficacy.   Warfarin does still have drug interactions with phenytoin (increases warfarin effect) and pheyobarbital (decreases warfarin effect) the dose can be adjusted and levels can be easily monitored. Will bridge with subq lovenx until INR therapeutic x2  6/4 INR 1.35 6/6                Warfarin 5mg  6/7 INR 1.22 Warfarin 5mg   6/8 INR 1.86  Goal of Therapy:  INR 2-3 Monitor platelets by anticoagulation protocol: Yes   Plan:  It is recommended per protocol to start with < 5mg  daily per algorithm due to interaction with phenytoin, however due to decreased efficacy with phenyobaritol, the decision was made to start with 5mg . Lovenox 1mg /kg BID until INR therapeutic x 2. Will check INR and CBC in the AM. Hgb on the lower end, will monitor closely.   6/7 Hgb stable. I will give another 5mg  tonight. Continue lovenox 1mg /kg BID. INR/  CBC in the AM  6/8 Hgb remains stable. INR jumped to 1.86. Will order warfarin 3mg  x 1 dose tonight. Continue lovenox 1mg /kg. F/U CBC and INR w/AM labs.   Gardner Candle, PharmD, BCPS Clinical Pharmacist 07/19/2017 8:23 AM

## 2017-07-19 NOTE — Clinical Social Work Note (Signed)
CSW received update that the patient has received [employer] auth [id number](#[id number] The patient can be received at [facility] once medically stable today, tomorrow, or Monday. However, should she not discharge prior to Monday, she will require a new authorization request. CSW is following.  Argentina PonderKaren Martha Zareena Willis, MSW, Theresia MajorsLCSWA [phone]

## 2017-07-19 NOTE — Progress Notes (Signed)
Discharge to [facility], discussed about DVT prophylaxis with Dr. Martha ClanKrasinski  he recommended patient can take aspirin 325 mg p.o. twice daily.,  Discharged to [facility] for rehab, as per Dr. Martha ClanKrasinski discharge to rehab to aspirin 325 mg p.o. twice daily.

## 2017-07-19 NOTE — Progress Notes (Signed)
[employer] [facility] - [place] at [facility]   PATIENT NAME: Brittany Hogan    MR#:  [id number]  DATE OF BIRTH:  04/08/1935  SUBJECTIVE:  Daughter-in-law is at bedside.  She is more alert than last few days.   Having trouble remembering things. REVIEW OF SYSTEMS:    Review of Systems  Unable to perform ROS: Mental acuity  Still confused at times but mental status slightly better than before but unable to obtain review of systems today also because of mental acuity.  Nutrition: Started on dysphagia diet by speech therapist. Tolerating Diet: Yes Tolerating PT: Eval noted.  DRUG ALLERGIES:  No Known Allergies  VITALS:  Blood pressure (!) 115/54, pulse 87, temperature 98 F (36.7 C), temperature source Oral, resp. rate 18, height 5\' 2"  (1.575 m), weight 77.6 kg (171 lb 1 oz), SpO2 94 %.  PHYSICAL EXAMINATION:   Physical Exam  GENERAL:  82 y.o.-year-old patient lying in bed, mental status is clearing as per patient's daughter in law.  I am seeing the patient for the first time since this admission.  EYES: Pupils equal, round, reactive to light. No scleral icterus. Extraocular muscles intact.  HEENT: Head atraumatic, normocephalic. Oropharynx and nasopharynx clear.  NECK:  Supple, no jugular venous distention. No thyroid enlargement, no tenderness.  LUNGS: Normal breath sounds bilaterally, + upper airway wheezing, No rales, rhonchi. No use of accessory muscles of respiration. Poor Resp. effort CARDIOVASCULAR: S1, S2 normal. No murmurs, rubs, or gallops.  ABDOMEN: Soft, nontender, nondistended. Bowel sounds present. No organomegaly or mass.  EXTREMITIES: No cyanosis, clubbing, b/l LE edema 1+    NEUROLOGIC: Cranial nerves II through XII are intact. No focal Motor or sensory deficits b/l.  Globally weak PSYCHIATRIC: The patient is alert and oriented x 1.   SKIN: No obvious rash, lesion, or ulcer.    LABORATORY PANEL:   CBC Recent Labs  Lab 07/19/17 0406  WBC 10.7  HGB  8.0*  HCT 24.1*  PLT 385   ------------------------------------------------------------------------------------------------------------------  Chemistries  Recent Labs  Lab 07/15/17 1349  07/19/17 0406  NA 129*   < > 134*  K 4.1   < > 3.9  CL 92*   < > 101  CO2 23   < > 25  GLUCOSE 136*   < > 108*  BUN 28*   < > 13  CREATININE 1.00   < > 0.65  CALCIUM 7.7*   < > 7.2*  AST 62*  --   --   ALT 56*  --   --   ALKPHOS 102  --   --   BILITOT 1.5*  --   --    < > = values in this interval not displayed.   ------------------------------------------------------------------------------------------------------------------  Cardiac Enzymes Recent Labs  Lab 07/16/17 1007  TROPONINI 2.32*   ------------------------------------------------------------------------------------------------------------------  RADIOLOGY:  No results found.   ASSESSMENT AND PLAN:   82 year old female with past medical history of seizures, previous CVA, recent left hip replacement who was at a nursing home presents to the hospital due to shortness of breath and respiratory failure noted to have a pulmonary embolism.  1.  Acute respiratory failure with hypoxia- secondary to pulmonary embolism. -Initially patient was admitted to the ICU on BiPAP, now weaned off of it.  Remains on nasal cannula oxygen. - cont. Lovenox/coumadin.  Cannot use DOAC due to interaction with Dilantin/Phenobarb.  2.  Acute pulmonary embolism-this is a provoked VTE given the recent left hip surgery. - Patient's Doppler of  lower extremity shows a subacute deep DVT of the right popliteal and saphenous vein. No need for IVC filter.  - cont. Lovenox/Coumadin to goal INR of 2-3.   Cannot use NOAC due to drug interaction with Dilantin/Phenobarb.   3.  Altered mental status/encephalopathy- multifactorial and related to dementia with mild sundowning, elevated Phenobarbital level. no acute seizures, no acute infectious source. -Initiate  neurology input and will DC phenobarbital for now and resume when level is less than 30.  Continue Dilantin, hold phenobarbital,  patient mental status is slightly better than yesterday.  Appreciate neurology following.  4.  History of seizures- cont. Dilantin, phenobarbital being held due to elevated level and its contributing to patient's mental status change.   5.  Elevated troponin- suspected to be secondary to supply demand ischemia from underlying pulmonary embolism. -Patient's echocardiogram showing normal ejection fraction with no wall motion abnormalities.  Appreciate cardiology input no plans for acute intervention at this time.  6.  Hypothyroidism-continue Synthroid.  7.  Depression-continue Celexa.  Appreciate PT eval and they recommend SNF/STR. Discharge once mental status improving.    All the records are reviewed and case discussed with Care Management/Social Worker. Management plans discussed with the patient, family and they are in agreement.  CODE STATUS: Full code  DVT Prophylaxis: Heparin drip  TOTAL TIME TAKING CARE OF THIS PATIENT: 30 minutes.   POSSIBLE D/C IN 1-2 DAYS, DEPENDING ON CLINICAL CONDITION.   Katha HammingSnehalatha Aydden Cumpian M.D on 07/19/2017 at 8:28 AM  Between 7am to 6pm - Pager - [phone]  After 6pm go to [email or web address] - Social research officer, governmentpassword EPAS [email or web address]  [facility] [place] [facility]  Office  [phone]  CC: Primary care physician; [name], [name], MD

## 2017-07-20 DIAGNOSIS — G9341 Metabolic encephalopathy: Secondary | ICD-10-CM

## 2017-07-20 LAB — CBC
HCT: 24 % — ABNORMAL LOW (ref 35.0–47.0)
Hemoglobin: 8.2 g/dL — ABNORMAL LOW (ref 12.0–16.0)
MCH: 30.9 pg (ref 26.0–34.0)
MCHC: 34.2 g/dL (ref 32.0–36.0)
MCV: 90.5 fL (ref 80.0–100.0)
PLATELETS: 428 10*3/uL (ref 150–440)
RBC: 2.65 MIL/uL — ABNORMAL LOW (ref 3.80–5.20)
RDW: 14.4 % (ref 11.5–14.5)
WBC: 11.6 10*3/uL — AB (ref 3.6–11.0)

## 2017-07-20 LAB — CULTURE, BLOOD (ROUTINE X 2)
CULTURE: NO GROWTH
CULTURE: NO GROWTH
Special Requests: ADEQUATE
Special Requests: ADEQUATE

## 2017-07-20 LAB — PROTIME-INR
INR: 2.83
PROTHROMBIN TIME: 29.5 s — AB (ref 11.4–15.2)

## 2017-07-20 LAB — PHENOBARBITAL LEVEL: Phenobarbital: 36.7 ug/mL — ABNORMAL HIGH (ref 15.0–30.0)

## 2017-07-20 MED ORDER — WARFARIN SODIUM 1 MG PO TABS
1.0000 mg | ORAL_TABLET | Freq: Once | ORAL | Status: AC
  Administered 2017-07-20: 17:00:00 1 mg via ORAL
  Filled 2017-07-20: qty 1

## 2017-07-20 MED ORDER — BISACODYL 10 MG RE SUPP
10.0000 mg | Freq: Every day | RECTAL | Status: DC | PRN
  Administered 2017-07-20: 12:00:00 10 mg via RECTAL
  Filled 2017-07-20: qty 1

## 2017-07-20 NOTE — Progress Notes (Addendum)
[employer] [facility] - [place] at [facility]   PATIENT NAME: Brittany Hogan    MR#:  [id number]  DATE OF BIRTH:  01/04/1936  SUBJECTIVE: Patient more alert than last few days.  Has some baseline dementia.   REVIEW OF SYSTEMS:    Review of Systems  Constitutional: Negative for chills and fever.  HENT: Negative for hearing loss.   Eyes: Negative for blurred vision, double vision and photophobia.  Respiratory: Negative for cough, hemoptysis and shortness of breath.   Cardiovascular: Negative for palpitations, orthopnea and leg swelling.  Gastrointestinal: Negative for abdominal pain, diarrhea and vomiting.  Genitourinary: Negative for dysuria and urgency.  Musculoskeletal: Negative for myalgias and neck pain.  Skin: Negative for rash.  Neurological: Negative for dizziness, focal weakness, seizures, weakness and headaches.  Psychiatric/Behavioral: Negative for memory loss. The patient does not have insomnia.    Nutrition: Tolerating Diet: Yes Tolerating PT: Eval noted.  DRUG ALLERGIES:  No Known Allergies  VITALS:  Blood pressure 119/70, pulse 81, temperature 98.3 F (36.8 C), temperature source Oral, resp. rate 18, height 5\' 2"  (1.575 m), weight 77.6 kg (171 lb 1 oz), SpO2 98 %.  PHYSICAL EXAMINATION:   Physical Exam  GENERAL:  82 y.o.-year-old patient lying in bed, mental status is clearing as per patient's daughter in law.  I am seeing the patient for the first time since this admission.  EYES: Pupils equal, round, reactive to light. No scleral icterus. Extraocular muscles intact.  HEENT: Head atraumatic, normocephalic. Oropharynx and nasopharynx clear.  NECK:  Supple, no jugular venous distention. No thyroid enlargement, no tenderness.  LUNGS: Normal breath sounds bilaterally, + upper airway wheezing, No rales, rhonchi. No use of accessory muscles of respiration. Poor Resp. effort CARDIOVASCULAR: S1, S2 normal. No murmurs, rubs, or gallops.  ABDOMEN: Soft, nontender,  nondistended. Bowel sounds present. No organomegaly or mass.  EXTREMITIES: No cyanosis, clubbing, b/l LE edema 1+    NEUROLOGIC: Cranial nerves II through XII are intact. No focal Motor or sensory deficits b/l.  Globally weak PSYCHIATRIC: The patient is alert and oriented x 1.   SKIN: No obvious rash, lesion, or ulcer.    LABORATORY PANEL:   CBC Recent Labs  Lab 07/20/17 0500  WBC 11.6*  HGB 8.2*  HCT 24.0*  PLT 428   ------------------------------------------------------------------------------------------------------------------  Chemistries  Recent Labs  Lab 07/15/17 1349  07/19/17 0406  NA 129*   < > 134*  K 4.1   < > 3.9  CL 92*   < > 101  CO2 23   < > 25  GLUCOSE 136*   < > 108*  BUN 28*   < > 13  CREATININE 1.00   < > 0.65  CALCIUM 7.7*   < > 7.2*  AST 62*  --   --   ALT 56*  --   --   ALKPHOS 102  --   --   BILITOT 1.5*  --   --    < > = values in this interval not displayed.   ------------------------------------------------------------------------------------------------------------------  Cardiac Enzymes Recent Labs  Lab 07/16/17 1007  TROPONINI 2.32*   ------------------------------------------------------------------------------------------------------------------  RADIOLOGY:  No results found.   ASSESSMENT AND PLAN:   82 year old female with past medical history of seizures, previous CVA, recent left hip replacement who was at a nursing home presents to the hospital due to shortness of breath and respiratory failure noted to have a pulmonary embolism.  1.  Acute respiratory failure with hypoxia- secondary to pulmonary embolism. -  Initially patient was admitted to the ICU on BiPAP, now weaned off of it.  Remains on nasal cannula oxygen. - cont. Lovenox/coumadin.  Cannot use DOAC due to interaction with Dilantin/Phenobarb.  2.  Acute pulmonary embolism-this is a provoked VTE given the recent left hip surgery. - Patient's Doppler of lower  extremity shows a subacute deep DVT of the right popliteal and saphenous vein. No need for IVC filter.  INR more than 2 today.  Discontinue Lovenox, continue Coumadin.    Cannot use NOAC due to drug interaction with Dilantin/Phenobarb.   3.  Altered mental status/encephalopathy- multifactorial and related to dementia with mild sundowning, elevated Phenobarbital level. no acute seizures, no acute infectious source. Appreciate neurology input and will DC phenobarbital for now and resume when level is less than 30.  Level today days 36.7.  Continue Dilantin, hold phenobarbital, resume phenobarbital at 64.8 mg twice daily when the level is at or below 30.  disCussed this with patient's daughter-in-law. patient mental status is is better.  4.  History of seizures- cont. Dilantin, phenobarbital being held due to elevated level and its contributing to patient's mental status change.   5.  Elevated troponin- suspected to be secondary to supply demand ischemia from underlying pulmonary embolism. -Patient's echocardiogram showing normal ejection fraction with no wall motion abnormalities.  Appreciate cardiology input no plans for acute intervention at this time.  6.  Hypothyroidism-continue Synthroid.  7.  Depression-continue Celexa.  Appreciate PT eval and they recommend SNF/STR. Discharge once mental status improving.   Out of bed to chair, continue laxatives for  constipation check bladder scan to see if she has any urine retention.  All the records are reviewed and case discussed with Care Management/Social Worker. Management plans discussed with the patient, family and they are in agreement.  CODE STATUS: Full code  DVT Prophylaxis: Heparin drip  TOTAL TIME TAKING CARE OF THIS PATIENT: 40 minutes.   POSSIBLE D/C IN 1-2 DAYS, DEPENDING ON CLINICAL CONDITION. Than 50% of time spent in counseling, coordination of the care, discussing with daughter-in-law about lab reports, further plan of  care.  Katha Hamming M.D on 07/20/2017 at 11:42 AM  Between 7am to 6pm - Pager - [phone]  After 6pm go to [email or web address] - Social research officer, government  [facility] [place] [facility]  Office  [phone]  CC: Primary care physician; Jaclyn Shaggy, MD

## 2017-07-20 NOTE — Consult Note (Signed)
HPI: Brittany Hogan is an 82 y.o. female with a known history of seizure disorder, CVA presented to the emergency room from facility.  Patient was discharged from our hospital last week after she had a left hip surgery.  [facility] with oral aspirin 320 family twice daily. Presented with confusion and disorientation. Acute pulmonary embolism was noted in the right pulmonary artery.  Patient started on IV heparin drip for anticoagulation.   Much more awake today and follows commands  Past Medical History:  Diagnosis Date  . Seizures (HCC)   . Stroke [facility](HCC)     Past Surgical History:  Procedure Laterality Date  . APPENDECTOMY    . BREAST BIOPSY Right    neg  . CHOLECYSTECTOMY N/A 09/27/2014   Procedure: LAPAROSCOPIC CHOLECYSTECTOMY with cholangiogram;  Surgeon: Kieth BrightlySeeplaputhur G Sankar, MD;  Location: [facility] ORS;  Service: General;  Laterality: N/A;  . INTRAMEDULLARY (IM) NAIL INTERTROCHANTERIC Left 07/08/2017   Procedure: INTRAMEDULLARY (IM) NAIL INTERTROCHANTRIC;  Surgeon: Juanell FairlyKrasinski, Kevin, MD;  Location: [facility] ORS;  Service: Orthopedics;  Laterality: Left;  . SHOULDER SURGERY Right     History reviewed. No pertinent family history.  Social History:  reports that she has never smoked. She has never used smokeless tobacco. She reports that she does not drink alcohol or use drugs.  No Known Allergies  Medications: I have reviewed the patient's current medications.  ROS: Unable to obtain due to confusion   Physical Examination: Blood pressure 119/70, pulse 81, temperature 98.3 F (36.8 C), temperature source Oral, resp. rate 18, height 5\' 2"  (1.575 m), weight 171 lb 1 oz (77.6 kg), SpO2 98 %.   Neurological Examination   Mental Status: Alert, oriented to name and location  Cranial Nerves: II: Discs flat bilaterally; Visual fields grossly normal, pupils equal, round, reactive to light and accommodation III,IV, VI: ptosis  not present, extra-ocular motions intact bilaterally V,VII: smile symmetric, facial light touch sensation normal bilaterally VIII: hearing normal bilaterally IX,X: gag reflex present XI: bilateral shoulder shrug XII: midline tongue extension Motor: Generalized weakness  Deep Tendon Reflexes: 1+ and symmetric throughout Plantars: Right: downgoing   Left: downgoing Cerebellar: Not tested     Laboratory Studies:   Basic Metabolic Panel: Recent Labs  Lab 07/15/17 1349 07/16/17 0333 07/17/17 0533 07/18/17 1039 07/19/17 0406  NA 129* 131* 131*  --  134*  K 4.1 3.6 3.0* 3.3* 3.9  CL 92* 95* 97*  --  101  CO2 23 25 24   --  25  GLUCOSE 136* 122* 105*  --  108*  BUN 28* 25* 22*  --  13  CREATININE 1.00 0.94 0.70  --  0.65  CALCIUM 7.7* 7.4* 7.2*  --  7.2*    Liver Function Tests: Recent Labs  Lab 07/15/17 1349  AST 62*  ALT 56*  ALKPHOS 102  BILITOT 1.5*  PROT 6.6  ALBUMIN 3.1*   No results for input(s): LIPASE, AMYLASE in the last 168 hours. No results for input(s): AMMONIA in the last 168 hours.  CBC: Recent Labs  Lab 07/15/17 1349  07/16/17 0333 07/17/17 0533 07/18/17 0729 07/19/17 0406 07/20/17 0500  WBC 16.7*   < > 15.0* 15.0* 11.3* 10.7 11.6*  NEUTROABS 13.4*  --   --   --   --   --   --   HGB 8.8*   < > 7.9* 7.6* 7.6* 8.0* 8.2*  HCT 26.1*   < > 23.7*  22.5* 22.7* 24.1* 24.0*  MCV 93.4   < > 92.5 93.2 91.9 91.4 90.5  PLT 296   < > 281 321 370 385 428   < > = values in this interval not displayed.    Cardiac Enzymes: Recent Labs  Lab 07/15/17 1349 07/15/17 2110 07/16/17 0333 07/16/17 1007  TROPONINI 1.41* 2.18* 2.45* 2.32*    BNP: Invalid input(s): POCBNP  CBG: Recent Labs  Lab 07/15/17 2055  GLUCAP 124*    Microbiology: Results for orders placed or performed during the hospital encounter of 07/15/17  Blood culture (routine x 2)     Status: None   Collection Time: 07/15/17  1:49 PM  Result Value Ref Range Status   Specimen  Description BLOOD LEFT [facility]  Final   Special Requests   Final    BOTTLES DRAWN AEROBIC AND ANAEROBIC Blood Culture adequate volume   Culture   Final    NO GROWTH 5 DAYS Performed at [facility], [address] [place], [place] [address]    Report Status 07/20/2017 FINAL  Final  Blood culture (routine x 2)     Status: None   Collection Time: 07/15/17  3:20 PM  Result Value Ref Range Status   Specimen Description BLOOD RIGHT ANTECUBITAL  Final   Special Requests   Final    BOTTLES DRAWN AEROBIC AND ANAEROBIC Blood Culture adequate volume   Culture   Final    NO GROWTH 5 DAYS Performed at [facility], [address] [place], [place] [address]    Report Status 07/20/2017 FINAL  Final  MRSA PCR Screening     Status: None   Collection Time: 07/15/17  8:55 PM  Result Value Ref Range Status   MRSA by PCR NEGATIVE NEGATIVE Final    Comment:        The GeneXpert MRSA Assay (FDA approved for NASAL specimens only), is one component of a comprehensive MRSA colonization surveillance program. It is not intended to diagnose MRSA infection nor to guide or monitor treatment for MRSA infections. Performed at [facility], [address] [place], [place] [address]     Coagulation Studies: Recent Labs    07/18/17 0729 07/19/17 0406 07/20/17 0500  LABPROT 15.3* 21.3* 29.5*  INR 1.22 1.86 2.83    Urinalysis:  Recent Labs  Lab 07/15/17 1911  COLORURINE YELLOW*  LABSPEC 1.014  PHURINE 6.0  GLUCOSEU NEGATIVE  HGBUR NEGATIVE  BILIRUBINUR NEGATIVE  KETONESUR NEGATIVE  PROTEINUR NEGATIVE  NITRITE NEGATIVE  LEUKOCYTESUR NEGATIVE    Lipid Panel:     Component Value Date/Time   CHOL 151 06/11/2011 0456   TRIG 82 06/11/2011 0456   HDL 63 (H) 06/11/2011 0456   VLDL 16 06/11/2011 0456   LDLCALC 72 06/11/2011 0456    HgbA1C: No results found for: HGBA1C  Urine Drug Screen:  No results found for: LABOPIA, COCAINSCRNUR, LABBENZ, AMPHETMU, THCU,  LABBARB  Alcohol Level: No results for input(s): ETH in the last 168 hours.   Imaging: No results found.   Assessment/Plan:   82 y.o. female with a known history of seizure disorder, CVA presented to the emergency room from facility.  Patient was discharged from our hospital last week after she had a left hip surgery.  [facility] with DVT prophylaxis with oral aspirin 320 family twice daily. Presented with confusion and disorientation. Acute pulmonary embolism was noted in the right pulmonary artery.  Patient started on IV heparin drip for  anticoagulation.  - Much improved today in terms of mental status - Phenobarb level of 36.7 today - another level tomorrow and restart at 64.8mg  BID when level is at or below 30 - con't dilantin     07/20/2017, 10:53 AM

## 2017-07-20 NOTE — Consult Note (Addendum)
ANTICOAGULATION CONSULT NOTE - Initial Consult  Pharmacy Consult for warfarin/lovenox Indication: pulmonary embolus  No Known Allergies  Patient Measurements: Height: 5\' 2"  (157.5 cm) Weight: 171 lb 1 oz (77.6 kg) IBW/kg (Calculated) : 50.1 Heparin Dosing Weight:   Vital Signs: Temp: 98.3 F (36.8 C) (06/09 0520) Temp Source: Oral (06/09 0520) BP: 119/70 (06/09 0520) Pulse Rate: 81 (06/09 0520)  Labs: Recent Labs    07/18/17 0729 07/19/17 0406 07/20/17 0500  HGB 7.6* 8.0* 8.2*  HCT 22.7* 24.1* 24.0*  PLT 370 385 428  LABPROT 15.3* 21.3* 29.5*  INR 1.22 1.86 2.83  CREATININE  --  0.65  --     Estimated Creatinine Clearance: 55 mL/min (by C-G formula based on SCr of 0.65 mg/dL).   Medical History: Past Medical History:  Diagnosis Date  . Seizures (HCC)   . Stroke [facility](HCC)     Medications:  Scheduled:  . citalopram  20 mg Oral Daily  . levothyroxine  25 mcg Oral QAC breakfast  . mouth rinse  15 mL Mouth Rinse BID  . pantoprazole  40 mg Oral Daily  . phenytoin  100 mg Oral TID  . sodium chloride flush  3 mL Intravenous Q12H  . Warfarin - Pharmacist Dosing Inpatient   Does not apply q1800    Assessment: Patient is a 82 year old female found to have a provoked PE/DVT- recent hip surgery. Pharmacy originally consulted to dose apixaban, however pt is on phenobarbitol and phenytoin which are both strong inducers of CYP3A4- decreasing the concentration of apixaban. Therefore its strongly recommended that it not be used. Unfortunately, xarelto has the same interaction, pradaxa is induced by phenytoin and Savasya, although to the lesser degree is induced as well. After discussion with Dr. Carla DrapeSainai, I think the best option is warfarin as levels can be monitored for efficacy.   Warfarin does still have drug interactions with phenytoin (increases warfarin effect) and pheyobarbital (decreases warfarin effect) the dose can be adjusted and levels can be easily monitored. Will bridge  with subq lovenx until INR therapeutic x2  6/4 INR 1.35 6/6                Warfarin 5mg  6/7 INR 1.22 Warfarin 5mg   6/8 INR 1.86    3mg  6/9 INR 2.83    1mg    Goal of Therapy:  INR 2-3 Monitor platelets by anticoagulation protocol: Yes   Plan:  It is recommended per protocol to start with < 5mg  daily per algorithm due to interaction with phenytoin, however due to decreased efficacy with phenyobaritol, the decision was made to start with 5mg . Lovenox 1mg /kg BID until INR therapeutic x 2. Will check INR and CBC in the AM. Hgb on the lower end, will monitor closely.   6/7 Hgb stable. I will give another 5mg  tonight. Continue lovenox 1mg /kg BID. INR/ CBC in the AM  6/8 Hgb remains stable. INR jumped to 1.86. Will order warfarin 3mg  x 1 dose tonight. Continue lovenox 1mg /kg. F/U CBC and INR w/AM labs.   6/9 Hgb stable. INR 2.83 from 1.86. Warfarin 1mg  po x 1 dose. Will recheck with AM labs. Dr Luberta MutterKonidena via phone d/c enoxaparin.    Luan PullingGarrett Starlett Pehrson, PharmD Clinical Pharmacist 07/20/2017 8:48 AM

## 2017-07-21 LAB — PROTIME-INR
INR: 2.57
PROTHROMBIN TIME: 27.4 s — AB (ref 11.4–15.2)

## 2017-07-21 LAB — PHENOBARBITAL LEVEL: PHENOBARBITAL: 34.4 ug/mL — AB (ref 15.0–30.0)

## 2017-07-21 MED ORDER — WARFARIN SODIUM 2.5 MG PO TABS
2.5000 mg | ORAL_TABLET | Freq: Once | ORAL | Status: DC
  Filled 2017-07-21: qty 1

## 2017-07-21 MED ORDER — WARFARIN SODIUM 2.5 MG PO TABS: 2.5000 mg | ORAL_TABLET | Freq: Every day | ORAL | Status: AC

## 2017-07-21 MED ORDER — PHENOBARBITAL 64.8 MG PO TABS: 64.8000 mg | ORAL_TABLET | Freq: Two times a day (BID) | ORAL | Status: AC

## 2017-07-21 NOTE — Progress Notes (Signed)
PT Cancellation Note  Patient Details Name: Cherrie GauzeCarolyn S Boswell MRN: [id number] DOB: May 19, 1935   Cancelled Treatment:    Reason Eval/Treat Not Completed: Patient at procedure or test/unavailable.  Order received.  Chart reviewed.  Medic present to assist with pt discharge upon PT arrival.   Glenetta HewSarah Calysta Craigo, PT, DPT 07/21/2017, 3:25 PM

## 2017-07-21 NOTE — Discharge Instructions (Signed)
Restart phenobarbital from 07/23/2017.  You will need repeat phenobarbital level checked 07/26/2017

## 2017-07-21 NOTE — Care Management Important Message (Signed)
Important Message  Patient Details  Name: Brittany Hogan MRN: [id number] Date of Birth: [birth year]   Medicare Important Message Given:  Yes    Olegario MessierKathy A Jahnai Slingerland 07/21/2017, 10:41 AM

## 2017-07-21 NOTE — Consult Note (Signed)
ANTICOAGULATION CONSULT NOTE - Initial Consult  Pharmacy Consult for warfarin/lovenox Indication: pulmonary embolus  No Known Allergies  Patient Measurements: Height: 5\' 2"  (157.5 cm) Weight: 171 lb 1 oz (77.6 kg) IBW/kg (Calculated) : 50.1 Heparin Dosing Weight:   Vital Signs: Temp: 100.3 F (37.9 C) (06/10 0435) Temp Source: Oral (06/10 0435) BP: 128/75 (06/10 0435) Pulse Rate: 88 (06/10 0435)  Labs: Recent Labs    07/19/17 0406 07/20/17 0500 07/21/17 0427  HGB 8.0* 8.2*  --   HCT 24.1* 24.0*  --   PLT 385 428  --   LABPROT 21.3* 29.5* 27.4*  INR 1.86 2.83 2.57  CREATININE 0.65  --   --     Estimated Creatinine Clearance: 55 mL/min (by C-G formula based on SCr of 0.65 mg/dL).   Medical History: Past Medical History:  Diagnosis Date  . Seizures (HCC)   . Stroke [facility])     Medications:  Scheduled:  . citalopram  20 mg Oral Daily  . levothyroxine  25 mcg Oral QAC breakfast  . mouth rinse  15 mL Mouth Rinse BID  . pantoprazole  40 mg Oral Daily  . phenytoin  100 mg Oral TID  . sodium chloride flush  3 mL Intravenous Q12H  . Warfarin - Pharmacist Dosing Inpatient   Does not apply q1800    Assessment: Patient is a 82 year old female found to have a provoked PE/DVT- recent hip surgery. Pharmacy originally consulted to dose apixaban, however pt is on phenobarbitol and phenytoin which are both strong inducers of CYP3A4- decreasing the concentration of apixaban. Therefore its strongly recommended that it not be used. Unfortunately, xarelto has the same interaction, pradaxa is induced by phenytoin and Savasya, although to the lesser degree is induced as well. After discussion with Dr. Carla Drape, I think the best option is warfarin as levels can be monitored for efficacy.   Warfarin does still have drug interactions with phenytoin (increases warfarin effect) and pheyobarbital (decreases warfarin effect) the dose can be adjusted and levels can be easily monitored. Will  bridge with subq lovenx until INR therapeutic x2  6/4 INR 1.35 6/6                Warfarin 5mg  6/7 INR 1.22 Warfarin 5mg   6/8 INR 1.86    3mg  6/9 INR 2.83    1mg   6/10      2.57   2.5 mg  Goal of Therapy:  INR 2-3 Monitor platelets by anticoagulation protocol: Yes   Plan:  It is recommended per protocol to start with < 5mg  daily per algorithm due to interaction with phenytoin, however due to decreased efficacy with phenyobaritol, the decision was made to start with 5mg . Lovenox 1mg /kg BID until INR therapeutic x 2. Will check INR and CBC in the AM. Hgb on the lower end, will monitor closely.   6/7 Hgb stable. I will give another 5mg  tonight. Continue lovenox 1mg /kg BID. INR/ CBC in the AM  6/8 Hgb remains stable. INR jumped to 1.86. Will order warfarin 3mg  x 1 dose tonight. Continue lovenox 1mg /kg. F/U CBC and INR w/AM labs.   6/9 Hgb stable. INR 2.83 from 1.86. Warfarin 1mg  po x 1 dose. Will recheck with AM labs. Dr Luberta Mutter via phone d/c enoxaparin.   6/10: INR down slightly to 2.57 from 2.83 following a 1mg  dose. Phenobarbital remains on hold since level is 34.4 (on hold since 6/8). Warfarin 2.5mg  po once today. F/U CBC and INR w/ AM labs  Lowella Bandyodney D Shawny Borkowski, PharmD Clinical Pharmacist 07/21/2017 10:07 AM

## 2017-07-21 NOTE — Plan of Care (Signed)

## 2017-07-21 NOTE — Progress Notes (Signed)
Pt is being discharged today, report called to [facility], IVs x2 were removed and all belongings were returned to her. She was transported by ems and her daughter in law [name] was informed as requested.

## 2017-07-21 NOTE — Clinical Social Work Note (Signed)
Patient is medically stable for discharge today. CSW notified patient and husband in the room. CSW notified Jomarie LongsJoseph, [facility]ison at [facility]. CSW also relayed [employer] authorization to Hurt[employer]. Patient will transport by EMS. RN will call report.   Ruthe Mannanandace Jazara Swiney MSW, [address]CSWA [phone]

## 2017-07-21 NOTE — Discharge Summary (Signed)
[facility] [place] [facility]   PATIENT NAME: Brittany Hogan    MR#:  [id number]  DATE OF BIRTH:  January 25, 1936  DATE OF ADMISSION:  07/15/2017 ADMITTING PHYSICIAN: Ihor Austin, MD  DATE OF DISCHARGE: 07/21/2017  PRIMARY CARE PHYSICIAN: Jaclyn Shaggy, MD   ADMISSION DIAGNOSIS:  DVT (deep venous thrombosis) (HCC) [I82.409] Acute respiratory failure with hypoxia (HCC) [J96.01] Other acute pulmonary embolism with acute cor pulmonale (HCC) [I26.09]  DISCHARGE DIAGNOSIS:  Active Problems:   Respiratory failure (HCC)   SECONDARY DIAGNOSIS:   Past Medical History:  Diagnosis Date  . Seizures (HCC)   . Stroke [facility])      ADMITTING HISTORY  HISTORY OF PRESENT ILLNESS: Brittany Hogan  is a 82 y.o. female with a known history of seizure disorder, CVA presented to the emergency room from [facility] facility.  Patient was discharged from our hospital last week after she had a left hip surgery.  She was discharged to [facility] facility with DVT prophylaxis with oral aspirin 320 family twice daily.  She was found to be short of breath this morning.  In regard to family members she was also a little lethargic and confused yesterday.  She was found to be hypoxic with O2 saturation of 80% she was brought to the emergency room.  Patient was worked up with CTA chest in the emergency room.  Acute pulmonary embolism was noted in the right pulmonary artery.  Patient started on IV heparin drip for anticoagulation.  Patient was put on BiPAP for respiratory distress.  No complaints of any chest pain. Hospitalist service was consulted for further care.  HOSPITAL COURSE:   82 year old female with past medical history of seizures, previous CVA, recent left hip replacement who was at a nursing home presents to the hospital due to shortness of breath and respiratory failure noted to have a pulmonary embolism.  1.  Acute respiratory failure with hypoxia- secondary to pulmonary  embolism. provoked VTE given the recent left hip surgery. - Patient's Doppler of lower extremity shows a subacute deep DVT of the right popliteal and saphenous vein. No need for IVC filter.  -Initially patient was admitted to the ICU on BiPAP, now weaned off of it.   Off nasal canula -   Cannot use DOAC due to interaction with Dilantin/Phenobarb. - INR at 2.5 Coumadin 2.5 mg daily. Repeat INR after discharge in 3 days Will need total 6 month therapy  2.  Altered mental status/encephalopathy- multifactorial and related to dementia with mild sundowning, elevated Phenobarbital level. no acute seizures, no acute infectious source. Appreciate neurology input . Phenobarb held and can be restarted at a lower dose of 64.8 mg BID from 07/23/2017. Repeat phenobarb level on 07/26/2017 Level down from 43 --> 34   Continue Dilantin patient mental status is is better.  3.  History of seizures- cont. Dilantin Restart phenobarbital from 6/12  5.  Elevated troponin- suspected to be secondary to supply demand ischemia from underlying pulmonary embolism. -Patient's echocardiogram showing normal ejection fraction with no wall motion abnormalities.  Appreciate cardiology input no plans for acute intervention at this time.  6.  Hypothyroidism-continue Synthroid.  7.  Depression-continue Celexa.  Stable for discharge back to SNF for PT  CONSULTS OBTAINED:  Treatment Team:  Pauletta Browns, MD Thana Farr, MD  DRUG ALLERGIES:  No Known Allergies  DISCHARGE MEDICATIONS:   Allergies as of 07/21/2017   No Known Allergies     Medication List    STOP  taking these medications   aspirin EC 325 MG tablet   nitrofurantoin (macrocrystal-monohydrate) 100 MG capsule Commonly known as:  MACROBID     TAKE these medications   citalopram 20 MG tablet Commonly known as:  CELEXA Take 20 mg by mouth daily.   hydrochlorothiazide 25 MG tablet Commonly known as:  HYDRODIURIL Take 25 mg by  mouth daily.   HYDROcodone-acetaminophen 5-325 MG tablet Commonly known as:  NORCO/VICODIN Take 1-2 tablets by mouth every 4 (four) hours as needed for moderate pain.   levothyroxine 25 MCG tablet Commonly known as:  SYNTHROID, LEVOTHROID Take 25 mcg by mouth daily.   pantoprazole 40 MG tablet Commonly known as:  PROTONIX Take 40 mg by mouth daily.   PHENobarbital 64.8 MG tablet Commonly known as:  LUMINAL Take 1 tablet (64.8 mg total) by mouth 2 (two) times daily. Start taking on:  07/23/2017 What changed:    how much to take  when to take this  additional instructions  These instructions start on 07/23/2017. If you are unsure what to do until then, ask your doctor or other care provider.   phenytoin 100 MG ER capsule Commonly known as:  DILANTIN Take 100 mg by mouth 3 (three) times daily.   polyethylene glycol packet Commonly known as:  MIRALAX Take 17 g by mouth daily as needed for mild constipation or moderate constipation. What changed:  when to take this   polyethylene glycol-electrolytes 420 g solution Commonly known as:  TRILYTE Take 4,000 mLs by mouth once.   warfarin 2.5 MG tablet Commonly known as:  COUMADIN Take 1 tablet (2.5 mg total) by mouth daily at 6 PM.       Today   VITAL SIGNS:  Blood pressure 128/75, pulse 88, temperature 100.3 F (37.9 C), temperature source Oral, resp. rate 16, height 5\' 2"  (1.575 m), weight 77.6 kg (171 lb 1 oz), SpO2 97 %.  I/O:    Intake/Output Summary (Last 24 hours) at 07/21/2017 1248 Last data filed at 07/21/2017 1036 Gross per 24 hour  Intake 300 ml  Output 150 ml  Net 150 ml    PHYSICAL EXAMINATION:  Physical Exam  GENERAL:  82 y.o.-year-old patient lying in the bed with no acute distress.  LUNGS: Normal breath sounds bilaterally, no wheezing, rales,rhonchi or crepitation. No use of accessory muscles of respiration.  CARDIOVASCULAR: S1, S2 normal. No murmurs, rubs, or gallops.  ABDOMEN: Soft,  non-tender, non-distended. Bowel sounds present. No organomegaly or mass.  NEUROLOGIC: Moves all 4 extremities. PSYCHIATRIC: The patient is awake and following commands SKIN: Dressing left hip  DATA REVIEW:   CBC Recent Labs  Lab 07/20/17 0500  WBC 11.6*  HGB 8.2*  HCT 24.0*  PLT 428    Chemistries  Recent Labs  Lab 07/15/17 1349  07/19/17 0406  NA 129*   < > 134*  K 4.1   < > 3.9  CL 92*   < > 101  CO2 23   < > 25  GLUCOSE 136*   < > 108*  BUN 28*   < > 13  CREATININE 1.00   < > 0.65  CALCIUM 7.7*   < > 7.2*  AST 62*  --   --   ALT 56*  --   --   ALKPHOS 102  --   --   BILITOT 1.5*  --   --    < > = values in this interval not displayed.    Cardiac Enzymes Recent Labs  Lab 07/16/17 1007  TROPONINI 2.32*    Microbiology Results  Results for orders placed or performed during the hospital encounter of 07/15/17  Blood culture (routine x 2)     Status: None   Collection Time: 07/15/17  1:49 PM  Result Value Ref Range Status   Specimen Description BLOOD LEFT [facility]  Final   Special Requests   Final    BOTTLES DRAWN AEROBIC AND ANAEROBIC Blood Culture adequate volume   Culture   Final    NO GROWTH 5 DAYS Performed at [facility], [address] [place], [place] [address]    Report Status 07/20/2017 FINAL  Final  Blood culture (routine x 2)     Status: None   Collection Time: 07/15/17  3:20 PM  Result Value Ref Range Status   Specimen Description BLOOD RIGHT ANTECUBITAL  Final   Special Requests   Final    BOTTLES DRAWN AEROBIC AND ANAEROBIC Blood Culture adequate volume   Culture   Final    NO GROWTH 5 DAYS Performed at [facility], [address] [place], [place] [address]    Report Status 07/20/2017 FINAL  Final  MRSA PCR Screening     Status: None   Collection Time: 07/15/17  8:55 PM  Result Value Ref Range Status   MRSA by PCR NEGATIVE NEGATIVE Final    Comment:        The GeneXpert MRSA Assay (FDA approved for NASAL  specimens only), is one component of a comprehensive MRSA colonization surveillance program. It is not intended to diagnose MRSA infection nor to guide or monitor treatment for MRSA infections. Performed at [facility], [address], [place], [place] [address]     RADIOLOGY:  No results found.  Follow up with PCP in 1 week.  Management plans discussed with the patient, family and they are in agreement.  CODE STATUS:     Code Status Orders  (From admission, onward)        Start     Ordered   07/15/17 2101  Full code  Continuous     07/15/17 2100    Code Status History    Date Active Date Inactive Code Status Order ID Comments User Context   07/07/2017 2352 07/10/2017 2204 Full Code [id number]  Cammy Copa, MD Inpatient      TOTAL TIME TAKING CARE OF THIS PATIENT ON DAY OF DISCHARGE: more than 30 minutes.   Molinda Bailiff Latese Dufault M.D on 07/21/2017 at 12:48 PM  Between 7am to 6pm - Pager - [phone]  After 6pm go to [email or web address] - password [email or web address] [facility]  [facility] [place] [facility]  Office  [phone]  CC: Primary care physician; Jaclyn Shaggy, MD  Note: This dictation was prepared with Dragon dictation along with smaller phrase technology. Any transcriptional errors that result from this process are unintentional.

## 2018-08-12 DIAGNOSIS — I4891 Unspecified atrial fibrillation: Secondary | ICD-10-CM | POA: Diagnosis not present

## 2018-08-12 DIAGNOSIS — Z86711 Personal history of pulmonary embolism: Secondary | ICD-10-CM | POA: Diagnosis not present

## 2018-08-12 DIAGNOSIS — Z79899 Other long term (current) drug therapy: Secondary | ICD-10-CM | POA: Diagnosis not present

## 2018-08-25 DIAGNOSIS — Z20828 Contact with and (suspected) exposure to other viral communicable diseases: Secondary | ICD-10-CM | POA: Diagnosis not present

## 2018-08-25 DIAGNOSIS — R791 Abnormal coagulation profile: Secondary | ICD-10-CM | POA: Diagnosis not present

## 2018-08-25 DIAGNOSIS — B342 Coronavirus infection, unspecified: Secondary | ICD-10-CM | POA: Diagnosis not present

## 2018-08-25 DIAGNOSIS — I4891 Unspecified atrial fibrillation: Secondary | ICD-10-CM | POA: Diagnosis not present

## 2018-08-31 DIAGNOSIS — I4891 Unspecified atrial fibrillation: Secondary | ICD-10-CM | POA: Diagnosis not present

## 2018-08-31 DIAGNOSIS — Z7901 Long term (current) use of anticoagulants: Secondary | ICD-10-CM | POA: Diagnosis not present

## 2018-09-08 DIAGNOSIS — I4891 Unspecified atrial fibrillation: Secondary | ICD-10-CM | POA: Diagnosis not present

## 2018-09-08 DIAGNOSIS — Z7901 Long term (current) use of anticoagulants: Secondary | ICD-10-CM | POA: Diagnosis not present

## 2018-09-14 DIAGNOSIS — F331 Major depressive disorder, recurrent, moderate: Secondary | ICD-10-CM | POA: Diagnosis not present

## 2018-09-14 DIAGNOSIS — E538 Deficiency of other specified B group vitamins: Secondary | ICD-10-CM | POA: Diagnosis not present

## 2018-09-14 DIAGNOSIS — F0281 Dementia in other diseases classified elsewhere with behavioral disturbance: Secondary | ICD-10-CM | POA: Diagnosis not present

## 2018-09-14 DIAGNOSIS — Z7901 Long term (current) use of anticoagulants: Secondary | ICD-10-CM | POA: Diagnosis not present

## 2018-09-14 DIAGNOSIS — I4891 Unspecified atrial fibrillation: Secondary | ICD-10-CM | POA: Diagnosis not present

## 2018-09-14 DIAGNOSIS — G40909 Epilepsy, unspecified, not intractable, without status epilepticus: Secondary | ICD-10-CM | POA: Diagnosis not present

## 2018-09-14 DIAGNOSIS — G309 Alzheimer's disease, unspecified: Secondary | ICD-10-CM | POA: Diagnosis not present

## 2018-09-19 DIAGNOSIS — F0390 Unspecified dementia without behavioral disturbance: Secondary | ICD-10-CM | POA: Diagnosis not present

## 2018-09-19 DIAGNOSIS — Z86718 Personal history of other venous thrombosis and embolism: Secondary | ICD-10-CM | POA: Diagnosis not present

## 2018-09-19 DIAGNOSIS — F329 Major depressive disorder, single episode, unspecified: Secondary | ICD-10-CM | POA: Diagnosis not present

## 2018-09-19 DIAGNOSIS — R569 Unspecified convulsions: Secondary | ICD-10-CM | POA: Diagnosis not present

## 2018-09-19 DIAGNOSIS — E039 Hypothyroidism, unspecified: Secondary | ICD-10-CM | POA: Diagnosis not present

## 2018-10-06 DIAGNOSIS — Z7901 Long term (current) use of anticoagulants: Secondary | ICD-10-CM | POA: Diagnosis not present

## 2018-10-06 DIAGNOSIS — E039 Hypothyroidism, unspecified: Secondary | ICD-10-CM | POA: Diagnosis not present

## 2018-10-07 DIAGNOSIS — I4891 Unspecified atrial fibrillation: Secondary | ICD-10-CM | POA: Diagnosis not present

## 2018-10-07 DIAGNOSIS — Z79899 Other long term (current) drug therapy: Secondary | ICD-10-CM | POA: Diagnosis not present

## 2018-10-08 DIAGNOSIS — B353 Tinea pedis: Secondary | ICD-10-CM | POA: Diagnosis not present

## 2018-10-08 DIAGNOSIS — I739 Peripheral vascular disease, unspecified: Secondary | ICD-10-CM | POA: Diagnosis not present

## 2018-10-08 DIAGNOSIS — L603 Nail dystrophy: Secondary | ICD-10-CM | POA: Diagnosis not present

## 2018-10-08 DIAGNOSIS — B351 Tinea unguium: Secondary | ICD-10-CM | POA: Diagnosis not present

## 2018-10-12 DIAGNOSIS — Z20828 Contact with and (suspected) exposure to other viral communicable diseases: Secondary | ICD-10-CM | POA: Diagnosis not present

## 2018-10-19 DIAGNOSIS — Z20828 Contact with and (suspected) exposure to other viral communicable diseases: Secondary | ICD-10-CM | POA: Diagnosis not present

## 2018-10-26 DIAGNOSIS — Z1159 Encounter for screening for other viral diseases: Secondary | ICD-10-CM | POA: Diagnosis not present

## 2018-11-02 DIAGNOSIS — F331 Major depressive disorder, recurrent, moderate: Secondary | ICD-10-CM | POA: Diagnosis not present

## 2018-11-02 DIAGNOSIS — E538 Deficiency of other specified B group vitamins: Secondary | ICD-10-CM | POA: Diagnosis not present

## 2018-11-02 DIAGNOSIS — G309 Alzheimer's disease, unspecified: Secondary | ICD-10-CM | POA: Diagnosis not present

## 2018-11-02 DIAGNOSIS — Z1159 Encounter for screening for other viral diseases: Secondary | ICD-10-CM | POA: Diagnosis not present

## 2018-11-02 DIAGNOSIS — G40909 Epilepsy, unspecified, not intractable, without status epilepticus: Secondary | ICD-10-CM | POA: Diagnosis not present

## 2018-11-02 DIAGNOSIS — F0281 Dementia in other diseases classified elsewhere with behavioral disturbance: Secondary | ICD-10-CM | POA: Diagnosis not present

## 2018-11-06 DIAGNOSIS — I4891 Unspecified atrial fibrillation: Secondary | ICD-10-CM | POA: Diagnosis not present

## 2018-11-06 DIAGNOSIS — Z7901 Long term (current) use of anticoagulants: Secondary | ICD-10-CM | POA: Diagnosis not present

## 2018-11-17 DIAGNOSIS — I10 Essential (primary) hypertension: Secondary | ICD-10-CM | POA: Diagnosis not present

## 2018-11-17 DIAGNOSIS — M6281 Muscle weakness (generalized): Secondary | ICD-10-CM | POA: Diagnosis not present

## 2018-11-17 DIAGNOSIS — F0390 Unspecified dementia without behavioral disturbance: Secondary | ICD-10-CM | POA: Diagnosis not present

## 2018-11-17 DIAGNOSIS — R262 Difficulty in walking, not elsewhere classified: Secondary | ICD-10-CM | POA: Diagnosis not present

## 2018-11-17 DIAGNOSIS — Z23 Encounter for immunization: Secondary | ICD-10-CM | POA: Diagnosis not present

## 2018-11-17 DIAGNOSIS — R41841 Cognitive communication deficit: Secondary | ICD-10-CM | POA: Diagnosis not present

## 2018-11-17 DIAGNOSIS — E039 Hypothyroidism, unspecified: Secondary | ICD-10-CM | POA: Diagnosis not present

## 2018-11-17 DIAGNOSIS — R1312 Dysphagia, oropharyngeal phase: Secondary | ICD-10-CM | POA: Diagnosis not present

## 2018-11-17 DIAGNOSIS — R569 Unspecified convulsions: Secondary | ICD-10-CM | POA: Diagnosis not present

## 2018-11-18 DIAGNOSIS — Z86718 Personal history of other venous thrombosis and embolism: Secondary | ICD-10-CM | POA: Diagnosis not present

## 2018-11-18 DIAGNOSIS — R262 Difficulty in walking, not elsewhere classified: Secondary | ICD-10-CM | POA: Diagnosis not present

## 2018-11-18 DIAGNOSIS — F0390 Unspecified dementia without behavioral disturbance: Secondary | ICD-10-CM | POA: Diagnosis not present

## 2018-11-18 DIAGNOSIS — E039 Hypothyroidism, unspecified: Secondary | ICD-10-CM | POA: Diagnosis not present

## 2018-11-18 DIAGNOSIS — F329 Major depressive disorder, single episode, unspecified: Secondary | ICD-10-CM | POA: Diagnosis not present

## 2018-11-18 DIAGNOSIS — R569 Unspecified convulsions: Secondary | ICD-10-CM | POA: Diagnosis not present

## 2018-11-24 DIAGNOSIS — Z1159 Encounter for screening for other viral diseases: Secondary | ICD-10-CM | POA: Diagnosis not present

## 2018-11-24 DIAGNOSIS — D519 Vitamin B12 deficiency anemia, unspecified: Secondary | ICD-10-CM | POA: Diagnosis not present

## 2018-11-24 DIAGNOSIS — D649 Anemia, unspecified: Secondary | ICD-10-CM | POA: Diagnosis not present

## 2018-11-24 DIAGNOSIS — I4891 Unspecified atrial fibrillation: Secondary | ICD-10-CM | POA: Diagnosis not present

## 2018-11-24 DIAGNOSIS — Z7901 Long term (current) use of anticoagulants: Secondary | ICD-10-CM | POA: Diagnosis not present

## 2018-11-24 DIAGNOSIS — I10 Essential (primary) hypertension: Secondary | ICD-10-CM | POA: Diagnosis not present

## 2018-11-24 DIAGNOSIS — Z79899 Other long term (current) drug therapy: Secondary | ICD-10-CM | POA: Diagnosis not present

## 2018-11-30 DIAGNOSIS — Z1159 Encounter for screening for other viral diseases: Secondary | ICD-10-CM | POA: Diagnosis not present

## 2018-12-04 DIAGNOSIS — I4891 Unspecified atrial fibrillation: Secondary | ICD-10-CM | POA: Diagnosis not present

## 2018-12-04 DIAGNOSIS — I10 Essential (primary) hypertension: Secondary | ICD-10-CM | POA: Diagnosis not present

## 2018-12-07 DIAGNOSIS — Z1159 Encounter for screening for other viral diseases: Secondary | ICD-10-CM | POA: Diagnosis not present

## 2018-12-09 DIAGNOSIS — R569 Unspecified convulsions: Secondary | ICD-10-CM | POA: Diagnosis not present

## 2018-12-09 DIAGNOSIS — M6281 Muscle weakness (generalized): Secondary | ICD-10-CM | POA: Diagnosis not present

## 2018-12-09 DIAGNOSIS — K219 Gastro-esophageal reflux disease without esophagitis: Secondary | ICD-10-CM | POA: Diagnosis not present

## 2018-12-09 DIAGNOSIS — I10 Essential (primary) hypertension: Secondary | ICD-10-CM | POA: Diagnosis not present

## 2018-12-09 DIAGNOSIS — I2699 Other pulmonary embolism without acute cor pulmonale: Secondary | ICD-10-CM | POA: Diagnosis not present

## 2018-12-09 DIAGNOSIS — E039 Hypothyroidism, unspecified: Secondary | ICD-10-CM | POA: Diagnosis not present

## 2018-12-09 DIAGNOSIS — F3289 Other specified depressive episodes: Secondary | ICD-10-CM | POA: Diagnosis not present

## 2018-12-09 DIAGNOSIS — F0390 Unspecified dementia without behavioral disturbance: Secondary | ICD-10-CM | POA: Diagnosis not present

## 2018-12-09 DIAGNOSIS — R262 Difficulty in walking, not elsewhere classified: Secondary | ICD-10-CM | POA: Diagnosis not present

## 2018-12-10 DIAGNOSIS — I4891 Unspecified atrial fibrillation: Secondary | ICD-10-CM | POA: Diagnosis not present

## 2018-12-10 DIAGNOSIS — D649 Anemia, unspecified: Secondary | ICD-10-CM | POA: Diagnosis not present

## 2018-12-10 DIAGNOSIS — N390 Urinary tract infection, site not specified: Secondary | ICD-10-CM | POA: Diagnosis not present

## 2018-12-11 DIAGNOSIS — F0390 Unspecified dementia without behavioral disturbance: Secondary | ICD-10-CM | POA: Diagnosis not present

## 2018-12-11 DIAGNOSIS — I482 Chronic atrial fibrillation, unspecified: Secondary | ICD-10-CM | POA: Diagnosis not present

## 2018-12-11 DIAGNOSIS — F3289 Other specified depressive episodes: Secondary | ICD-10-CM | POA: Diagnosis not present

## 2018-12-11 DIAGNOSIS — E039 Hypothyroidism, unspecified: Secondary | ICD-10-CM | POA: Diagnosis not present

## 2018-12-11 DIAGNOSIS — R319 Hematuria, unspecified: Secondary | ICD-10-CM | POA: Diagnosis not present

## 2018-12-11 DIAGNOSIS — R262 Difficulty in walking, not elsewhere classified: Secondary | ICD-10-CM | POA: Diagnosis not present

## 2018-12-11 DIAGNOSIS — I2699 Other pulmonary embolism without acute cor pulmonale: Secondary | ICD-10-CM | POA: Diagnosis not present

## 2018-12-11 DIAGNOSIS — N390 Urinary tract infection, site not specified: Secondary | ICD-10-CM | POA: Diagnosis not present

## 2018-12-11 DIAGNOSIS — R569 Unspecified convulsions: Secondary | ICD-10-CM | POA: Diagnosis not present

## 2018-12-11 DIAGNOSIS — K219 Gastro-esophageal reflux disease without esophagitis: Secondary | ICD-10-CM | POA: Diagnosis not present

## 2018-12-11 DIAGNOSIS — I10 Essential (primary) hypertension: Secondary | ICD-10-CM | POA: Diagnosis not present

## 2018-12-11 DIAGNOSIS — M6281 Muscle weakness (generalized): Secondary | ICD-10-CM | POA: Diagnosis not present

## 2018-12-12 DIAGNOSIS — R262 Difficulty in walking, not elsewhere classified: Secondary | ICD-10-CM | POA: Diagnosis not present

## 2018-12-12 DIAGNOSIS — F3289 Other specified depressive episodes: Secondary | ICD-10-CM | POA: Diagnosis not present

## 2018-12-12 DIAGNOSIS — R569 Unspecified convulsions: Secondary | ICD-10-CM | POA: Diagnosis not present

## 2018-12-12 DIAGNOSIS — F0390 Unspecified dementia without behavioral disturbance: Secondary | ICD-10-CM | POA: Diagnosis not present

## 2018-12-12 DIAGNOSIS — I2699 Other pulmonary embolism without acute cor pulmonale: Secondary | ICD-10-CM | POA: Diagnosis not present

## 2018-12-12 DIAGNOSIS — K219 Gastro-esophageal reflux disease without esophagitis: Secondary | ICD-10-CM | POA: Diagnosis not present

## 2018-12-12 DIAGNOSIS — E039 Hypothyroidism, unspecified: Secondary | ICD-10-CM | POA: Diagnosis not present

## 2018-12-12 DIAGNOSIS — I10 Essential (primary) hypertension: Secondary | ICD-10-CM | POA: Diagnosis not present

## 2018-12-12 DIAGNOSIS — M6281 Muscle weakness (generalized): Secondary | ICD-10-CM | POA: Diagnosis not present

## 2018-12-14 DIAGNOSIS — S72002A Fracture of unspecified part of neck of left femur, initial encounter for closed fracture: Secondary | ICD-10-CM | POA: Diagnosis not present

## 2018-12-14 DIAGNOSIS — F3289 Other specified depressive episodes: Secondary | ICD-10-CM | POA: Diagnosis not present

## 2018-12-14 DIAGNOSIS — F0390 Unspecified dementia without behavioral disturbance: Secondary | ICD-10-CM | POA: Diagnosis not present

## 2018-12-14 DIAGNOSIS — Z1159 Encounter for screening for other viral diseases: Secondary | ICD-10-CM | POA: Diagnosis not present

## 2018-12-14 DIAGNOSIS — E039 Hypothyroidism, unspecified: Secondary | ICD-10-CM | POA: Diagnosis not present

## 2018-12-14 DIAGNOSIS — M6281 Muscle weakness (generalized): Secondary | ICD-10-CM | POA: Diagnosis not present

## 2018-12-14 DIAGNOSIS — I10 Essential (primary) hypertension: Secondary | ICD-10-CM | POA: Diagnosis not present

## 2018-12-14 DIAGNOSIS — K219 Gastro-esophageal reflux disease without esophagitis: Secondary | ICD-10-CM | POA: Diagnosis not present

## 2018-12-14 DIAGNOSIS — R262 Difficulty in walking, not elsewhere classified: Secondary | ICD-10-CM | POA: Diagnosis not present

## 2018-12-14 DIAGNOSIS — R569 Unspecified convulsions: Secondary | ICD-10-CM | POA: Diagnosis not present

## 2018-12-15 DIAGNOSIS — K219 Gastro-esophageal reflux disease without esophagitis: Secondary | ICD-10-CM | POA: Diagnosis not present

## 2018-12-15 DIAGNOSIS — M6281 Muscle weakness (generalized): Secondary | ICD-10-CM | POA: Diagnosis not present

## 2018-12-15 DIAGNOSIS — F3289 Other specified depressive episodes: Secondary | ICD-10-CM | POA: Diagnosis not present

## 2018-12-15 DIAGNOSIS — S72002A Fracture of unspecified part of neck of left femur, initial encounter for closed fracture: Secondary | ICD-10-CM | POA: Diagnosis not present

## 2018-12-15 DIAGNOSIS — R262 Difficulty in walking, not elsewhere classified: Secondary | ICD-10-CM | POA: Diagnosis not present

## 2018-12-15 DIAGNOSIS — I10 Essential (primary) hypertension: Secondary | ICD-10-CM | POA: Diagnosis not present

## 2018-12-15 DIAGNOSIS — R569 Unspecified convulsions: Secondary | ICD-10-CM | POA: Diagnosis not present

## 2018-12-15 DIAGNOSIS — F0390 Unspecified dementia without behavioral disturbance: Secondary | ICD-10-CM | POA: Diagnosis not present

## 2018-12-15 DIAGNOSIS — E039 Hypothyroidism, unspecified: Secondary | ICD-10-CM | POA: Diagnosis not present

## 2018-12-16 DIAGNOSIS — M6281 Muscle weakness (generalized): Secondary | ICD-10-CM | POA: Diagnosis not present

## 2018-12-16 DIAGNOSIS — S72002A Fracture of unspecified part of neck of left femur, initial encounter for closed fracture: Secondary | ICD-10-CM | POA: Diagnosis not present

## 2018-12-16 DIAGNOSIS — I10 Essential (primary) hypertension: Secondary | ICD-10-CM | POA: Diagnosis not present

## 2018-12-16 DIAGNOSIS — F3289 Other specified depressive episodes: Secondary | ICD-10-CM | POA: Diagnosis not present

## 2018-12-16 DIAGNOSIS — E039 Hypothyroidism, unspecified: Secondary | ICD-10-CM | POA: Diagnosis not present

## 2018-12-16 DIAGNOSIS — R262 Difficulty in walking, not elsewhere classified: Secondary | ICD-10-CM | POA: Diagnosis not present

## 2018-12-16 DIAGNOSIS — R569 Unspecified convulsions: Secondary | ICD-10-CM | POA: Diagnosis not present

## 2018-12-16 DIAGNOSIS — F0390 Unspecified dementia without behavioral disturbance: Secondary | ICD-10-CM | POA: Diagnosis not present

## 2018-12-16 DIAGNOSIS — K219 Gastro-esophageal reflux disease without esophagitis: Secondary | ICD-10-CM | POA: Diagnosis not present

## 2018-12-17 DIAGNOSIS — E039 Hypothyroidism, unspecified: Secondary | ICD-10-CM | POA: Diagnosis not present

## 2018-12-17 DIAGNOSIS — M6281 Muscle weakness (generalized): Secondary | ICD-10-CM | POA: Diagnosis not present

## 2018-12-17 DIAGNOSIS — R05 Cough: Secondary | ICD-10-CM | POA: Diagnosis not present

## 2018-12-17 DIAGNOSIS — S72002A Fracture of unspecified part of neck of left femur, initial encounter for closed fracture: Secondary | ICD-10-CM | POA: Diagnosis not present

## 2018-12-17 DIAGNOSIS — K219 Gastro-esophageal reflux disease without esophagitis: Secondary | ICD-10-CM | POA: Diagnosis not present

## 2018-12-17 DIAGNOSIS — F3289 Other specified depressive episodes: Secondary | ICD-10-CM | POA: Diagnosis not present

## 2018-12-17 DIAGNOSIS — R262 Difficulty in walking, not elsewhere classified: Secondary | ICD-10-CM | POA: Diagnosis not present

## 2018-12-17 DIAGNOSIS — I10 Essential (primary) hypertension: Secondary | ICD-10-CM | POA: Diagnosis not present

## 2018-12-17 DIAGNOSIS — Z1159 Encounter for screening for other viral diseases: Secondary | ICD-10-CM | POA: Diagnosis not present

## 2018-12-17 DIAGNOSIS — R569 Unspecified convulsions: Secondary | ICD-10-CM | POA: Diagnosis not present

## 2018-12-17 DIAGNOSIS — F0390 Unspecified dementia without behavioral disturbance: Secondary | ICD-10-CM | POA: Diagnosis not present

## 2018-12-18 DIAGNOSIS — Z79899 Other long term (current) drug therapy: Secondary | ICD-10-CM | POA: Diagnosis not present

## 2018-12-18 DIAGNOSIS — R509 Fever, unspecified: Secondary | ICD-10-CM | POA: Diagnosis not present

## 2018-12-18 DIAGNOSIS — R319 Hematuria, unspecified: Secondary | ICD-10-CM | POA: Diagnosis not present

## 2018-12-18 DIAGNOSIS — R05 Cough: Secondary | ICD-10-CM | POA: Diagnosis not present

## 2018-12-18 DIAGNOSIS — N390 Urinary tract infection, site not specified: Secondary | ICD-10-CM | POA: Diagnosis not present

## 2018-12-18 DIAGNOSIS — D649 Anemia, unspecified: Secondary | ICD-10-CM | POA: Diagnosis not present

## 2018-12-19 DIAGNOSIS — K219 Gastro-esophageal reflux disease without esophagitis: Secondary | ICD-10-CM | POA: Diagnosis not present

## 2018-12-19 DIAGNOSIS — F0390 Unspecified dementia without behavioral disturbance: Secondary | ICD-10-CM | POA: Diagnosis not present

## 2018-12-19 DIAGNOSIS — S72002A Fracture of unspecified part of neck of left femur, initial encounter for closed fracture: Secondary | ICD-10-CM | POA: Diagnosis not present

## 2018-12-19 DIAGNOSIS — R262 Difficulty in walking, not elsewhere classified: Secondary | ICD-10-CM | POA: Diagnosis not present

## 2018-12-19 DIAGNOSIS — M6281 Muscle weakness (generalized): Secondary | ICD-10-CM | POA: Diagnosis not present

## 2018-12-19 DIAGNOSIS — R569 Unspecified convulsions: Secondary | ICD-10-CM | POA: Diagnosis not present

## 2018-12-19 DIAGNOSIS — E039 Hypothyroidism, unspecified: Secondary | ICD-10-CM | POA: Diagnosis not present

## 2018-12-19 DIAGNOSIS — F3289 Other specified depressive episodes: Secondary | ICD-10-CM | POA: Diagnosis not present

## 2018-12-19 DIAGNOSIS — I10 Essential (primary) hypertension: Secondary | ICD-10-CM | POA: Diagnosis not present

## 2018-12-20 DIAGNOSIS — I10 Essential (primary) hypertension: Secondary | ICD-10-CM | POA: Diagnosis not present

## 2018-12-20 DIAGNOSIS — R262 Difficulty in walking, not elsewhere classified: Secondary | ICD-10-CM | POA: Diagnosis not present

## 2018-12-20 DIAGNOSIS — K219 Gastro-esophageal reflux disease without esophagitis: Secondary | ICD-10-CM | POA: Diagnosis not present

## 2018-12-20 DIAGNOSIS — F0390 Unspecified dementia without behavioral disturbance: Secondary | ICD-10-CM | POA: Diagnosis not present

## 2018-12-20 DIAGNOSIS — F3289 Other specified depressive episodes: Secondary | ICD-10-CM | POA: Diagnosis not present

## 2018-12-20 DIAGNOSIS — R569 Unspecified convulsions: Secondary | ICD-10-CM | POA: Diagnosis not present

## 2018-12-20 DIAGNOSIS — E039 Hypothyroidism, unspecified: Secondary | ICD-10-CM | POA: Diagnosis not present

## 2018-12-20 DIAGNOSIS — M6281 Muscle weakness (generalized): Secondary | ICD-10-CM | POA: Diagnosis not present

## 2018-12-20 DIAGNOSIS — S72002A Fracture of unspecified part of neck of left femur, initial encounter for closed fracture: Secondary | ICD-10-CM | POA: Diagnosis not present

## 2018-12-21 DIAGNOSIS — Z1159 Encounter for screening for other viral diseases: Secondary | ICD-10-CM | POA: Diagnosis not present

## 2018-12-25 DIAGNOSIS — D649 Anemia, unspecified: Secondary | ICD-10-CM | POA: Diagnosis not present

## 2018-12-25 DIAGNOSIS — I10 Essential (primary) hypertension: Secondary | ICD-10-CM | POA: Diagnosis not present

## 2018-12-28 DIAGNOSIS — Z1159 Encounter for screening for other viral diseases: Secondary | ICD-10-CM | POA: Diagnosis not present

## 2018-12-29 DIAGNOSIS — F331 Major depressive disorder, recurrent, moderate: Secondary | ICD-10-CM | POA: Diagnosis not present

## 2018-12-29 DIAGNOSIS — F0281 Dementia in other diseases classified elsewhere with behavioral disturbance: Secondary | ICD-10-CM | POA: Diagnosis not present

## 2018-12-29 DIAGNOSIS — E538 Deficiency of other specified B group vitamins: Secondary | ICD-10-CM | POA: Diagnosis not present

## 2018-12-29 DIAGNOSIS — G309 Alzheimer's disease, unspecified: Secondary | ICD-10-CM | POA: Diagnosis not present

## 2018-12-29 DIAGNOSIS — G40909 Epilepsy, unspecified, not intractable, without status epilepticus: Secondary | ICD-10-CM | POA: Diagnosis not present

## 2018-12-30 DIAGNOSIS — R319 Hematuria, unspecified: Secondary | ICD-10-CM | POA: Diagnosis not present

## 2018-12-30 DIAGNOSIS — N390 Urinary tract infection, site not specified: Secondary | ICD-10-CM | POA: Diagnosis not present

## 2018-12-30 DIAGNOSIS — Z79899 Other long term (current) drug therapy: Secondary | ICD-10-CM | POA: Diagnosis not present

## 2018-12-30 DIAGNOSIS — D649 Anemia, unspecified: Secondary | ICD-10-CM | POA: Diagnosis not present

## 2019-01-01 DIAGNOSIS — R6889 Other general symptoms and signs: Secondary | ICD-10-CM | POA: Diagnosis not present

## 2019-01-01 DIAGNOSIS — I4891 Unspecified atrial fibrillation: Secondary | ICD-10-CM | POA: Diagnosis not present

## 2019-01-05 DIAGNOSIS — I480 Paroxysmal atrial fibrillation: Secondary | ICD-10-CM | POA: Diagnosis not present

## 2019-01-06 DIAGNOSIS — Z1159 Encounter for screening for other viral diseases: Secondary | ICD-10-CM | POA: Diagnosis not present

## 2019-01-11 DIAGNOSIS — Z1159 Encounter for screening for other viral diseases: Secondary | ICD-10-CM | POA: Diagnosis not present

## 2019-01-12 DIAGNOSIS — I82412 Acute embolism and thrombosis of left femoral vein: Secondary | ICD-10-CM | POA: Diagnosis not present

## 2019-01-12 DIAGNOSIS — B351 Tinea unguium: Secondary | ICD-10-CM | POA: Diagnosis not present

## 2019-01-12 DIAGNOSIS — I4891 Unspecified atrial fibrillation: Secondary | ICD-10-CM | POA: Diagnosis not present

## 2019-01-12 DIAGNOSIS — I739 Peripheral vascular disease, unspecified: Secondary | ICD-10-CM | POA: Diagnosis not present

## 2019-01-19 DIAGNOSIS — Z1159 Encounter for screening for other viral diseases: Secondary | ICD-10-CM | POA: Diagnosis not present

## 2019-01-20 DIAGNOSIS — K219 Gastro-esophageal reflux disease without esophagitis: Secondary | ICD-10-CM | POA: Diagnosis not present

## 2019-01-20 DIAGNOSIS — F329 Major depressive disorder, single episode, unspecified: Secondary | ICD-10-CM | POA: Diagnosis not present

## 2019-01-20 DIAGNOSIS — Z86718 Personal history of other venous thrombosis and embolism: Secondary | ICD-10-CM | POA: Diagnosis not present

## 2019-01-20 DIAGNOSIS — R569 Unspecified convulsions: Secondary | ICD-10-CM | POA: Diagnosis not present

## 2019-01-20 DIAGNOSIS — F0390 Unspecified dementia without behavioral disturbance: Secondary | ICD-10-CM | POA: Diagnosis not present

## 2019-01-20 DIAGNOSIS — I4891 Unspecified atrial fibrillation: Secondary | ICD-10-CM | POA: Diagnosis not present

## 2019-01-20 DIAGNOSIS — E039 Hypothyroidism, unspecified: Secondary | ICD-10-CM | POA: Diagnosis not present

## 2019-01-20 DIAGNOSIS — Z7901 Long term (current) use of anticoagulants: Secondary | ICD-10-CM | POA: Diagnosis not present

## 2019-01-20 DIAGNOSIS — I10 Essential (primary) hypertension: Secondary | ICD-10-CM | POA: Diagnosis not present

## 2019-01-20 DIAGNOSIS — R262 Difficulty in walking, not elsewhere classified: Secondary | ICD-10-CM | POA: Diagnosis not present

## 2019-01-20 DIAGNOSIS — I82412 Acute embolism and thrombosis of left femoral vein: Secondary | ICD-10-CM | POA: Diagnosis not present

## 2019-01-25 DIAGNOSIS — Z1159 Encounter for screening for other viral diseases: Secondary | ICD-10-CM | POA: Diagnosis not present

## 2019-01-26 DIAGNOSIS — Z79899 Other long term (current) drug therapy: Secondary | ICD-10-CM | POA: Diagnosis not present

## 2019-01-26 DIAGNOSIS — I4891 Unspecified atrial fibrillation: Secondary | ICD-10-CM | POA: Diagnosis not present

## 2019-01-29 DIAGNOSIS — Z79899 Other long term (current) drug therapy: Secondary | ICD-10-CM | POA: Diagnosis not present

## 2019-01-29 DIAGNOSIS — I4891 Unspecified atrial fibrillation: Secondary | ICD-10-CM | POA: Diagnosis not present

## 2019-02-02 DIAGNOSIS — Z1159 Encounter for screening for other viral diseases: Secondary | ICD-10-CM | POA: Diagnosis not present

## 2019-02-04 DIAGNOSIS — Z7901 Long term (current) use of anticoagulants: Secondary | ICD-10-CM | POA: Diagnosis not present

## 2019-02-04 DIAGNOSIS — I4891 Unspecified atrial fibrillation: Secondary | ICD-10-CM | POA: Diagnosis not present

## 2019-02-09 DIAGNOSIS — Z1159 Encounter for screening for other viral diseases: Secondary | ICD-10-CM | POA: Diagnosis not present

## 2019-02-09 DIAGNOSIS — I4891 Unspecified atrial fibrillation: Secondary | ICD-10-CM | POA: Diagnosis not present

## 2019-02-09 DIAGNOSIS — I82412 Acute embolism and thrombosis of left femoral vein: Secondary | ICD-10-CM | POA: Diagnosis not present

## 2019-02-16 DIAGNOSIS — Z1159 Encounter for screening for other viral diseases: Secondary | ICD-10-CM | POA: Diagnosis not present

## 2019-02-19 DIAGNOSIS — Z7901 Long term (current) use of anticoagulants: Secondary | ICD-10-CM | POA: Diagnosis not present

## 2019-02-19 DIAGNOSIS — I4891 Unspecified atrial fibrillation: Secondary | ICD-10-CM | POA: Diagnosis not present

## 2019-02-22 DIAGNOSIS — I4891 Unspecified atrial fibrillation: Secondary | ICD-10-CM | POA: Diagnosis not present

## 2019-02-22 DIAGNOSIS — Z7901 Long term (current) use of anticoagulants: Secondary | ICD-10-CM | POA: Diagnosis not present

## 2019-02-23 DIAGNOSIS — F331 Major depressive disorder, recurrent, moderate: Secondary | ICD-10-CM | POA: Diagnosis not present

## 2019-02-23 DIAGNOSIS — Z1159 Encounter for screening for other viral diseases: Secondary | ICD-10-CM | POA: Diagnosis not present

## 2019-02-23 DIAGNOSIS — G40909 Epilepsy, unspecified, not intractable, without status epilepticus: Secondary | ICD-10-CM | POA: Diagnosis not present

## 2019-02-23 DIAGNOSIS — G309 Alzheimer's disease, unspecified: Secondary | ICD-10-CM | POA: Diagnosis not present

## 2019-02-23 DIAGNOSIS — F0281 Dementia in other diseases classified elsewhere with behavioral disturbance: Secondary | ICD-10-CM | POA: Diagnosis not present

## 2019-02-23 DIAGNOSIS — E538 Deficiency of other specified B group vitamins: Secondary | ICD-10-CM | POA: Diagnosis not present

## 2019-03-02 DIAGNOSIS — Z1159 Encounter for screening for other viral diseases: Secondary | ICD-10-CM | POA: Diagnosis not present

## 2019-03-03 DIAGNOSIS — I2699 Other pulmonary embolism without acute cor pulmonale: Secondary | ICD-10-CM | POA: Diagnosis not present

## 2019-03-03 DIAGNOSIS — I4891 Unspecified atrial fibrillation: Secondary | ICD-10-CM | POA: Diagnosis not present

## 2019-03-09 DIAGNOSIS — Z1159 Encounter for screening for other viral diseases: Secondary | ICD-10-CM | POA: Diagnosis not present

## 2019-03-16 DIAGNOSIS — Z1159 Encounter for screening for other viral diseases: Secondary | ICD-10-CM | POA: Diagnosis not present

## 2019-03-23 DIAGNOSIS — Z86718 Personal history of other venous thrombosis and embolism: Secondary | ICD-10-CM | POA: Diagnosis not present

## 2019-03-23 DIAGNOSIS — F329 Major depressive disorder, single episode, unspecified: Secondary | ICD-10-CM | POA: Diagnosis not present

## 2019-03-23 DIAGNOSIS — R262 Difficulty in walking, not elsewhere classified: Secondary | ICD-10-CM | POA: Diagnosis not present

## 2019-03-23 DIAGNOSIS — L853 Xerosis cutis: Secondary | ICD-10-CM | POA: Diagnosis not present

## 2019-03-23 DIAGNOSIS — R569 Unspecified convulsions: Secondary | ICD-10-CM | POA: Diagnosis not present

## 2019-03-23 DIAGNOSIS — E1151 Type 2 diabetes mellitus with diabetic peripheral angiopathy without gangrene: Secondary | ICD-10-CM | POA: Diagnosis not present

## 2019-03-23 DIAGNOSIS — Z1159 Encounter for screening for other viral diseases: Secondary | ICD-10-CM | POA: Diagnosis not present

## 2019-03-23 DIAGNOSIS — F0390 Unspecified dementia without behavioral disturbance: Secondary | ICD-10-CM | POA: Diagnosis not present

## 2019-03-23 DIAGNOSIS — Z794 Long term (current) use of insulin: Secondary | ICD-10-CM | POA: Diagnosis not present

## 2019-03-23 DIAGNOSIS — B351 Tinea unguium: Secondary | ICD-10-CM | POA: Diagnosis not present

## 2019-03-23 DIAGNOSIS — E039 Hypothyroidism, unspecified: Secondary | ICD-10-CM | POA: Diagnosis not present

## 2019-03-24 DIAGNOSIS — I2699 Other pulmonary embolism without acute cor pulmonale: Secondary | ICD-10-CM | POA: Diagnosis not present

## 2019-03-24 DIAGNOSIS — R569 Unspecified convulsions: Secondary | ICD-10-CM | POA: Diagnosis not present

## 2019-03-24 DIAGNOSIS — I4891 Unspecified atrial fibrillation: Secondary | ICD-10-CM | POA: Diagnosis not present

## 2019-03-24 DIAGNOSIS — D649 Anemia, unspecified: Secondary | ICD-10-CM | POA: Diagnosis not present

## 2019-04-20 DIAGNOSIS — G309 Alzheimer's disease, unspecified: Secondary | ICD-10-CM | POA: Diagnosis not present

## 2019-04-20 DIAGNOSIS — F0281 Dementia in other diseases classified elsewhere with behavioral disturbance: Secondary | ICD-10-CM | POA: Diagnosis not present

## 2019-04-20 DIAGNOSIS — G40909 Epilepsy, unspecified, not intractable, without status epilepticus: Secondary | ICD-10-CM | POA: Diagnosis not present

## 2019-04-20 DIAGNOSIS — F331 Major depressive disorder, recurrent, moderate: Secondary | ICD-10-CM | POA: Diagnosis not present

## 2019-04-20 DIAGNOSIS — E538 Deficiency of other specified B group vitamins: Secondary | ICD-10-CM | POA: Diagnosis not present

## 2019-04-21 DIAGNOSIS — Z79899 Other long term (current) drug therapy: Secondary | ICD-10-CM | POA: Diagnosis not present

## 2019-04-21 DIAGNOSIS — I2699 Other pulmonary embolism without acute cor pulmonale: Secondary | ICD-10-CM | POA: Diagnosis not present

## 2019-04-21 DIAGNOSIS — I4891 Unspecified atrial fibrillation: Secondary | ICD-10-CM | POA: Diagnosis not present

## 2019-04-28 DIAGNOSIS — Z79899 Other long term (current) drug therapy: Secondary | ICD-10-CM | POA: Diagnosis not present

## 2019-04-28 DIAGNOSIS — R319 Hematuria, unspecified: Secondary | ICD-10-CM | POA: Diagnosis not present

## 2019-04-28 DIAGNOSIS — N390 Urinary tract infection, site not specified: Secondary | ICD-10-CM | POA: Diagnosis not present

## 2019-05-03 DIAGNOSIS — F0390 Unspecified dementia without behavioral disturbance: Secondary | ICD-10-CM | POA: Diagnosis not present

## 2019-05-03 DIAGNOSIS — M6281 Muscle weakness (generalized): Secondary | ICD-10-CM | POA: Diagnosis not present

## 2019-05-03 DIAGNOSIS — R569 Unspecified convulsions: Secondary | ICD-10-CM | POA: Diagnosis not present

## 2019-05-03 DIAGNOSIS — Z86711 Personal history of pulmonary embolism: Secondary | ICD-10-CM | POA: Diagnosis not present

## 2019-05-03 DIAGNOSIS — I10 Essential (primary) hypertension: Secondary | ICD-10-CM | POA: Diagnosis not present

## 2019-05-03 DIAGNOSIS — E039 Hypothyroidism, unspecified: Secondary | ICD-10-CM | POA: Diagnosis not present

## 2019-05-03 DIAGNOSIS — R262 Difficulty in walking, not elsewhere classified: Secondary | ICD-10-CM | POA: Diagnosis not present

## 2019-05-03 DIAGNOSIS — K219 Gastro-esophageal reflux disease without esophagitis: Secondary | ICD-10-CM | POA: Diagnosis not present

## 2019-05-03 DIAGNOSIS — F3289 Other specified depressive episodes: Secondary | ICD-10-CM | POA: Diagnosis not present

## 2019-05-04 DIAGNOSIS — K219 Gastro-esophageal reflux disease without esophagitis: Secondary | ICD-10-CM | POA: Diagnosis not present

## 2019-05-04 DIAGNOSIS — R262 Difficulty in walking, not elsewhere classified: Secondary | ICD-10-CM | POA: Diagnosis not present

## 2019-05-04 DIAGNOSIS — I10 Essential (primary) hypertension: Secondary | ICD-10-CM | POA: Diagnosis not present

## 2019-05-04 DIAGNOSIS — R569 Unspecified convulsions: Secondary | ICD-10-CM | POA: Diagnosis not present

## 2019-05-04 DIAGNOSIS — M6281 Muscle weakness (generalized): Secondary | ICD-10-CM | POA: Diagnosis not present

## 2019-05-04 DIAGNOSIS — Z86711 Personal history of pulmonary embolism: Secondary | ICD-10-CM | POA: Diagnosis not present

## 2019-05-04 DIAGNOSIS — E039 Hypothyroidism, unspecified: Secondary | ICD-10-CM | POA: Diagnosis not present

## 2019-05-04 DIAGNOSIS — F0390 Unspecified dementia without behavioral disturbance: Secondary | ICD-10-CM | POA: Diagnosis not present

## 2019-05-04 DIAGNOSIS — F3289 Other specified depressive episodes: Secondary | ICD-10-CM | POA: Diagnosis not present

## 2019-05-05 DIAGNOSIS — K219 Gastro-esophageal reflux disease without esophagitis: Secondary | ICD-10-CM | POA: Diagnosis not present

## 2019-05-05 DIAGNOSIS — R262 Difficulty in walking, not elsewhere classified: Secondary | ICD-10-CM | POA: Diagnosis not present

## 2019-05-05 DIAGNOSIS — Z86711 Personal history of pulmonary embolism: Secondary | ICD-10-CM | POA: Diagnosis not present

## 2019-05-05 DIAGNOSIS — F3289 Other specified depressive episodes: Secondary | ICD-10-CM | POA: Diagnosis not present

## 2019-05-05 DIAGNOSIS — E039 Hypothyroidism, unspecified: Secondary | ICD-10-CM | POA: Diagnosis not present

## 2019-05-05 DIAGNOSIS — I10 Essential (primary) hypertension: Secondary | ICD-10-CM | POA: Diagnosis not present

## 2019-05-05 DIAGNOSIS — F0390 Unspecified dementia without behavioral disturbance: Secondary | ICD-10-CM | POA: Diagnosis not present

## 2019-05-05 DIAGNOSIS — R569 Unspecified convulsions: Secondary | ICD-10-CM | POA: Diagnosis not present

## 2019-05-05 DIAGNOSIS — M6281 Muscle weakness (generalized): Secondary | ICD-10-CM | POA: Diagnosis not present

## 2019-05-06 DIAGNOSIS — M6281 Muscle weakness (generalized): Secondary | ICD-10-CM | POA: Diagnosis not present

## 2019-05-06 DIAGNOSIS — F0390 Unspecified dementia without behavioral disturbance: Secondary | ICD-10-CM | POA: Diagnosis not present

## 2019-05-06 DIAGNOSIS — E039 Hypothyroidism, unspecified: Secondary | ICD-10-CM | POA: Diagnosis not present

## 2019-05-06 DIAGNOSIS — R262 Difficulty in walking, not elsewhere classified: Secondary | ICD-10-CM | POA: Diagnosis not present

## 2019-05-06 DIAGNOSIS — F3289 Other specified depressive episodes: Secondary | ICD-10-CM | POA: Diagnosis not present

## 2019-05-06 DIAGNOSIS — I10 Essential (primary) hypertension: Secondary | ICD-10-CM | POA: Diagnosis not present

## 2019-05-06 DIAGNOSIS — R569 Unspecified convulsions: Secondary | ICD-10-CM | POA: Diagnosis not present

## 2019-05-06 DIAGNOSIS — Z86711 Personal history of pulmonary embolism: Secondary | ICD-10-CM | POA: Diagnosis not present

## 2019-05-06 DIAGNOSIS — K219 Gastro-esophageal reflux disease without esophagitis: Secondary | ICD-10-CM | POA: Diagnosis not present

## 2019-05-07 DIAGNOSIS — R262 Difficulty in walking, not elsewhere classified: Secondary | ICD-10-CM | POA: Diagnosis not present

## 2019-05-07 DIAGNOSIS — I10 Essential (primary) hypertension: Secondary | ICD-10-CM | POA: Diagnosis not present

## 2019-05-07 DIAGNOSIS — R569 Unspecified convulsions: Secondary | ICD-10-CM | POA: Diagnosis not present

## 2019-05-07 DIAGNOSIS — E039 Hypothyroidism, unspecified: Secondary | ICD-10-CM | POA: Diagnosis not present

## 2019-05-07 DIAGNOSIS — M6281 Muscle weakness (generalized): Secondary | ICD-10-CM | POA: Diagnosis not present

## 2019-05-07 DIAGNOSIS — F3289 Other specified depressive episodes: Secondary | ICD-10-CM | POA: Diagnosis not present

## 2019-05-07 DIAGNOSIS — K219 Gastro-esophageal reflux disease without esophagitis: Secondary | ICD-10-CM | POA: Diagnosis not present

## 2019-05-07 DIAGNOSIS — Z86711 Personal history of pulmonary embolism: Secondary | ICD-10-CM | POA: Diagnosis not present

## 2019-05-07 DIAGNOSIS — F0390 Unspecified dementia without behavioral disturbance: Secondary | ICD-10-CM | POA: Diagnosis not present

## 2019-05-20 DIAGNOSIS — E039 Hypothyroidism, unspecified: Secondary | ICD-10-CM | POA: Diagnosis not present

## 2019-05-20 DIAGNOSIS — R569 Unspecified convulsions: Secondary | ICD-10-CM | POA: Diagnosis not present

## 2019-05-20 DIAGNOSIS — Z86718 Personal history of other venous thrombosis and embolism: Secondary | ICD-10-CM | POA: Diagnosis not present

## 2019-05-21 DIAGNOSIS — I4891 Unspecified atrial fibrillation: Secondary | ICD-10-CM | POA: Diagnosis not present

## 2019-05-21 DIAGNOSIS — Z7901 Long term (current) use of anticoagulants: Secondary | ICD-10-CM | POA: Diagnosis not present

## 2019-05-26 DIAGNOSIS — Z1159 Encounter for screening for other viral diseases: Secondary | ICD-10-CM | POA: Diagnosis not present

## 2019-06-01 DIAGNOSIS — Z1159 Encounter for screening for other viral diseases: Secondary | ICD-10-CM | POA: Diagnosis not present

## 2019-06-08 DIAGNOSIS — Z1159 Encounter for screening for other viral diseases: Secondary | ICD-10-CM | POA: Diagnosis not present

## 2019-06-18 DIAGNOSIS — I4891 Unspecified atrial fibrillation: Secondary | ICD-10-CM | POA: Diagnosis not present

## 2019-06-18 DIAGNOSIS — Z7901 Long term (current) use of anticoagulants: Secondary | ICD-10-CM | POA: Diagnosis not present

## 2019-06-22 DIAGNOSIS — F0281 Dementia in other diseases classified elsewhere with behavioral disturbance: Secondary | ICD-10-CM | POA: Diagnosis not present

## 2019-06-22 DIAGNOSIS — G309 Alzheimer's disease, unspecified: Secondary | ICD-10-CM | POA: Diagnosis not present

## 2019-06-22 DIAGNOSIS — E538 Deficiency of other specified B group vitamins: Secondary | ICD-10-CM | POA: Diagnosis not present

## 2019-06-22 DIAGNOSIS — F331 Major depressive disorder, recurrent, moderate: Secondary | ICD-10-CM | POA: Diagnosis not present

## 2019-06-22 DIAGNOSIS — G40909 Epilepsy, unspecified, not intractable, without status epilepticus: Secondary | ICD-10-CM | POA: Diagnosis not present

## 2019-06-24 DIAGNOSIS — R569 Unspecified convulsions: Secondary | ICD-10-CM | POA: Diagnosis not present

## 2019-06-24 DIAGNOSIS — E039 Hypothyroidism, unspecified: Secondary | ICD-10-CM | POA: Diagnosis not present

## 2019-06-24 DIAGNOSIS — I2699 Other pulmonary embolism without acute cor pulmonale: Secondary | ICD-10-CM | POA: Diagnosis not present

## 2019-06-24 DIAGNOSIS — Z7901 Long term (current) use of anticoagulants: Secondary | ICD-10-CM | POA: Diagnosis not present

## 2019-06-24 DIAGNOSIS — F0390 Unspecified dementia without behavioral disturbance: Secondary | ICD-10-CM | POA: Diagnosis not present

## 2019-06-24 DIAGNOSIS — R262 Difficulty in walking, not elsewhere classified: Secondary | ICD-10-CM | POA: Diagnosis not present

## 2019-06-24 DIAGNOSIS — F3289 Other specified depressive episodes: Secondary | ICD-10-CM | POA: Diagnosis not present

## 2019-06-24 DIAGNOSIS — M6281 Muscle weakness (generalized): Secondary | ICD-10-CM | POA: Diagnosis not present

## 2019-06-24 DIAGNOSIS — K219 Gastro-esophageal reflux disease without esophagitis: Secondary | ICD-10-CM | POA: Diagnosis not present

## 2019-06-24 DIAGNOSIS — Z86711 Personal history of pulmonary embolism: Secondary | ICD-10-CM | POA: Diagnosis not present

## 2019-06-24 DIAGNOSIS — I4891 Unspecified atrial fibrillation: Secondary | ICD-10-CM | POA: Diagnosis not present

## 2019-06-28 DIAGNOSIS — E039 Hypothyroidism, unspecified: Secondary | ICD-10-CM | POA: Diagnosis not present

## 2019-06-28 DIAGNOSIS — Z86711 Personal history of pulmonary embolism: Secondary | ICD-10-CM | POA: Diagnosis not present

## 2019-06-28 DIAGNOSIS — F0390 Unspecified dementia without behavioral disturbance: Secondary | ICD-10-CM | POA: Diagnosis not present

## 2019-06-28 DIAGNOSIS — K219 Gastro-esophageal reflux disease without esophagitis: Secondary | ICD-10-CM | POA: Diagnosis not present

## 2019-06-28 DIAGNOSIS — I2699 Other pulmonary embolism without acute cor pulmonale: Secondary | ICD-10-CM | POA: Diagnosis not present

## 2019-06-28 DIAGNOSIS — F3289 Other specified depressive episodes: Secondary | ICD-10-CM | POA: Diagnosis not present

## 2019-06-28 DIAGNOSIS — M6281 Muscle weakness (generalized): Secondary | ICD-10-CM | POA: Diagnosis not present

## 2019-06-28 DIAGNOSIS — R569 Unspecified convulsions: Secondary | ICD-10-CM | POA: Diagnosis not present

## 2019-06-28 DIAGNOSIS — R262 Difficulty in walking, not elsewhere classified: Secondary | ICD-10-CM | POA: Diagnosis not present

## 2019-06-29 DIAGNOSIS — I2699 Other pulmonary embolism without acute cor pulmonale: Secondary | ICD-10-CM | POA: Diagnosis not present

## 2019-06-29 DIAGNOSIS — Z86711 Personal history of pulmonary embolism: Secondary | ICD-10-CM | POA: Diagnosis not present

## 2019-06-29 DIAGNOSIS — F3289 Other specified depressive episodes: Secondary | ICD-10-CM | POA: Diagnosis not present

## 2019-06-29 DIAGNOSIS — R569 Unspecified convulsions: Secondary | ICD-10-CM | POA: Diagnosis not present

## 2019-06-29 DIAGNOSIS — F0390 Unspecified dementia without behavioral disturbance: Secondary | ICD-10-CM | POA: Diagnosis not present

## 2019-06-29 DIAGNOSIS — K219 Gastro-esophageal reflux disease without esophagitis: Secondary | ICD-10-CM | POA: Diagnosis not present

## 2019-06-29 DIAGNOSIS — E039 Hypothyroidism, unspecified: Secondary | ICD-10-CM | POA: Diagnosis not present

## 2019-06-29 DIAGNOSIS — R262 Difficulty in walking, not elsewhere classified: Secondary | ICD-10-CM | POA: Diagnosis not present

## 2019-06-29 DIAGNOSIS — M6281 Muscle weakness (generalized): Secondary | ICD-10-CM | POA: Diagnosis not present

## 2019-06-30 DIAGNOSIS — Z86711 Personal history of pulmonary embolism: Secondary | ICD-10-CM | POA: Diagnosis not present

## 2019-06-30 DIAGNOSIS — I2699 Other pulmonary embolism without acute cor pulmonale: Secondary | ICD-10-CM | POA: Diagnosis not present

## 2019-06-30 DIAGNOSIS — F3289 Other specified depressive episodes: Secondary | ICD-10-CM | POA: Diagnosis not present

## 2019-06-30 DIAGNOSIS — K219 Gastro-esophageal reflux disease without esophagitis: Secondary | ICD-10-CM | POA: Diagnosis not present

## 2019-06-30 DIAGNOSIS — E039 Hypothyroidism, unspecified: Secondary | ICD-10-CM | POA: Diagnosis not present

## 2019-06-30 DIAGNOSIS — R569 Unspecified convulsions: Secondary | ICD-10-CM | POA: Diagnosis not present

## 2019-06-30 DIAGNOSIS — R262 Difficulty in walking, not elsewhere classified: Secondary | ICD-10-CM | POA: Diagnosis not present

## 2019-06-30 DIAGNOSIS — F0390 Unspecified dementia without behavioral disturbance: Secondary | ICD-10-CM | POA: Diagnosis not present

## 2019-06-30 DIAGNOSIS — M6281 Muscle weakness (generalized): Secondary | ICD-10-CM | POA: Diagnosis not present

## 2019-07-01 DIAGNOSIS — Z86711 Personal history of pulmonary embolism: Secondary | ICD-10-CM | POA: Diagnosis not present

## 2019-07-01 DIAGNOSIS — R262 Difficulty in walking, not elsewhere classified: Secondary | ICD-10-CM | POA: Diagnosis not present

## 2019-07-01 DIAGNOSIS — F3289 Other specified depressive episodes: Secondary | ICD-10-CM | POA: Diagnosis not present

## 2019-07-01 DIAGNOSIS — R569 Unspecified convulsions: Secondary | ICD-10-CM | POA: Diagnosis not present

## 2019-07-01 DIAGNOSIS — I2699 Other pulmonary embolism without acute cor pulmonale: Secondary | ICD-10-CM | POA: Diagnosis not present

## 2019-07-01 DIAGNOSIS — E039 Hypothyroidism, unspecified: Secondary | ICD-10-CM | POA: Diagnosis not present

## 2019-07-01 DIAGNOSIS — F0390 Unspecified dementia without behavioral disturbance: Secondary | ICD-10-CM | POA: Diagnosis not present

## 2019-07-01 DIAGNOSIS — M6281 Muscle weakness (generalized): Secondary | ICD-10-CM | POA: Diagnosis not present

## 2019-07-01 DIAGNOSIS — K219 Gastro-esophageal reflux disease without esophagitis: Secondary | ICD-10-CM | POA: Diagnosis not present

## 2019-07-08 DIAGNOSIS — Z7901 Long term (current) use of anticoagulants: Secondary | ICD-10-CM | POA: Diagnosis not present

## 2019-07-08 DIAGNOSIS — I4891 Unspecified atrial fibrillation: Secondary | ICD-10-CM | POA: Diagnosis not present

## 2019-07-10 DIAGNOSIS — I4891 Unspecified atrial fibrillation: Secondary | ICD-10-CM | POA: Diagnosis not present

## 2019-07-10 DIAGNOSIS — Z7901 Long term (current) use of anticoagulants: Secondary | ICD-10-CM | POA: Diagnosis not present

## 2019-07-13 DIAGNOSIS — I4891 Unspecified atrial fibrillation: Secondary | ICD-10-CM | POA: Diagnosis not present

## 2019-07-13 DIAGNOSIS — I82412 Acute embolism and thrombosis of left femoral vein: Secondary | ICD-10-CM | POA: Diagnosis not present

## 2019-07-17 DIAGNOSIS — Z7901 Long term (current) use of anticoagulants: Secondary | ICD-10-CM | POA: Diagnosis not present

## 2019-07-17 DIAGNOSIS — I4891 Unspecified atrial fibrillation: Secondary | ICD-10-CM | POA: Diagnosis not present

## 2019-07-20 DIAGNOSIS — F331 Major depressive disorder, recurrent, moderate: Secondary | ICD-10-CM | POA: Diagnosis not present

## 2019-07-20 DIAGNOSIS — E538 Deficiency of other specified B group vitamins: Secondary | ICD-10-CM | POA: Diagnosis not present

## 2019-07-20 DIAGNOSIS — G309 Alzheimer's disease, unspecified: Secondary | ICD-10-CM | POA: Diagnosis not present

## 2019-07-20 DIAGNOSIS — F0281 Dementia in other diseases classified elsewhere with behavioral disturbance: Secondary | ICD-10-CM | POA: Diagnosis not present

## 2019-07-20 DIAGNOSIS — G40909 Epilepsy, unspecified, not intractable, without status epilepticus: Secondary | ICD-10-CM | POA: Diagnosis not present

## 2019-07-22 DIAGNOSIS — Z86718 Personal history of other venous thrombosis and embolism: Secondary | ICD-10-CM | POA: Diagnosis not present

## 2019-07-22 DIAGNOSIS — R569 Unspecified convulsions: Secondary | ICD-10-CM | POA: Diagnosis not present

## 2019-07-22 DIAGNOSIS — E039 Hypothyroidism, unspecified: Secondary | ICD-10-CM | POA: Diagnosis not present

## 2019-07-23 DIAGNOSIS — Z7901 Long term (current) use of anticoagulants: Secondary | ICD-10-CM | POA: Diagnosis not present

## 2019-07-23 DIAGNOSIS — I4891 Unspecified atrial fibrillation: Secondary | ICD-10-CM | POA: Diagnosis not present

## 2019-07-31 DIAGNOSIS — I4891 Unspecified atrial fibrillation: Secondary | ICD-10-CM | POA: Diagnosis not present

## 2019-07-31 DIAGNOSIS — I82412 Acute embolism and thrombosis of left femoral vein: Secondary | ICD-10-CM | POA: Diagnosis not present

## 2019-08-10 DIAGNOSIS — I4891 Unspecified atrial fibrillation: Secondary | ICD-10-CM | POA: Diagnosis not present

## 2019-08-10 DIAGNOSIS — I82412 Acute embolism and thrombosis of left femoral vein: Secondary | ICD-10-CM | POA: Diagnosis not present

## 2019-08-17 DIAGNOSIS — G309 Alzheimer's disease, unspecified: Secondary | ICD-10-CM | POA: Diagnosis not present

## 2019-08-17 DIAGNOSIS — F0281 Dementia in other diseases classified elsewhere with behavioral disturbance: Secondary | ICD-10-CM | POA: Diagnosis not present

## 2019-08-17 DIAGNOSIS — E538 Deficiency of other specified B group vitamins: Secondary | ICD-10-CM | POA: Diagnosis not present

## 2019-08-17 DIAGNOSIS — F331 Major depressive disorder, recurrent, moderate: Secondary | ICD-10-CM | POA: Diagnosis not present

## 2019-08-17 DIAGNOSIS — G40909 Epilepsy, unspecified, not intractable, without status epilepticus: Secondary | ICD-10-CM | POA: Diagnosis not present

## 2019-08-19 DIAGNOSIS — I82412 Acute embolism and thrombosis of left femoral vein: Secondary | ICD-10-CM | POA: Diagnosis not present

## 2019-08-19 DIAGNOSIS — I4891 Unspecified atrial fibrillation: Secondary | ICD-10-CM | POA: Diagnosis not present

## 2019-08-24 DIAGNOSIS — R262 Difficulty in walking, not elsewhere classified: Secondary | ICD-10-CM | POA: Diagnosis not present

## 2019-08-24 DIAGNOSIS — I739 Peripheral vascular disease, unspecified: Secondary | ICD-10-CM | POA: Diagnosis not present

## 2019-08-24 DIAGNOSIS — B351 Tinea unguium: Secondary | ICD-10-CM | POA: Diagnosis not present

## 2019-08-24 DIAGNOSIS — L603 Nail dystrophy: Secondary | ICD-10-CM | POA: Diagnosis not present

## 2019-08-24 DIAGNOSIS — L853 Xerosis cutis: Secondary | ICD-10-CM | POA: Diagnosis not present

## 2019-09-09 DIAGNOSIS — Z7901 Long term (current) use of anticoagulants: Secondary | ICD-10-CM | POA: Diagnosis not present

## 2019-09-12 IMAGING — CR DG PELVIS 1-2V
1 series · 1 of 1 positions shown · non-contrast
Comparison: Limited views of the pelvis from an abdominal
radiograph of July 12, 2015

CLINICAL DATA: Known left hip fracture following mechanical fall
yesterday. History of previous seizure activity and chronic right
lower extremity weakness.

EXAM:
PELVIS - 1-2 VIEW

[dg pelvis 1-2 views]
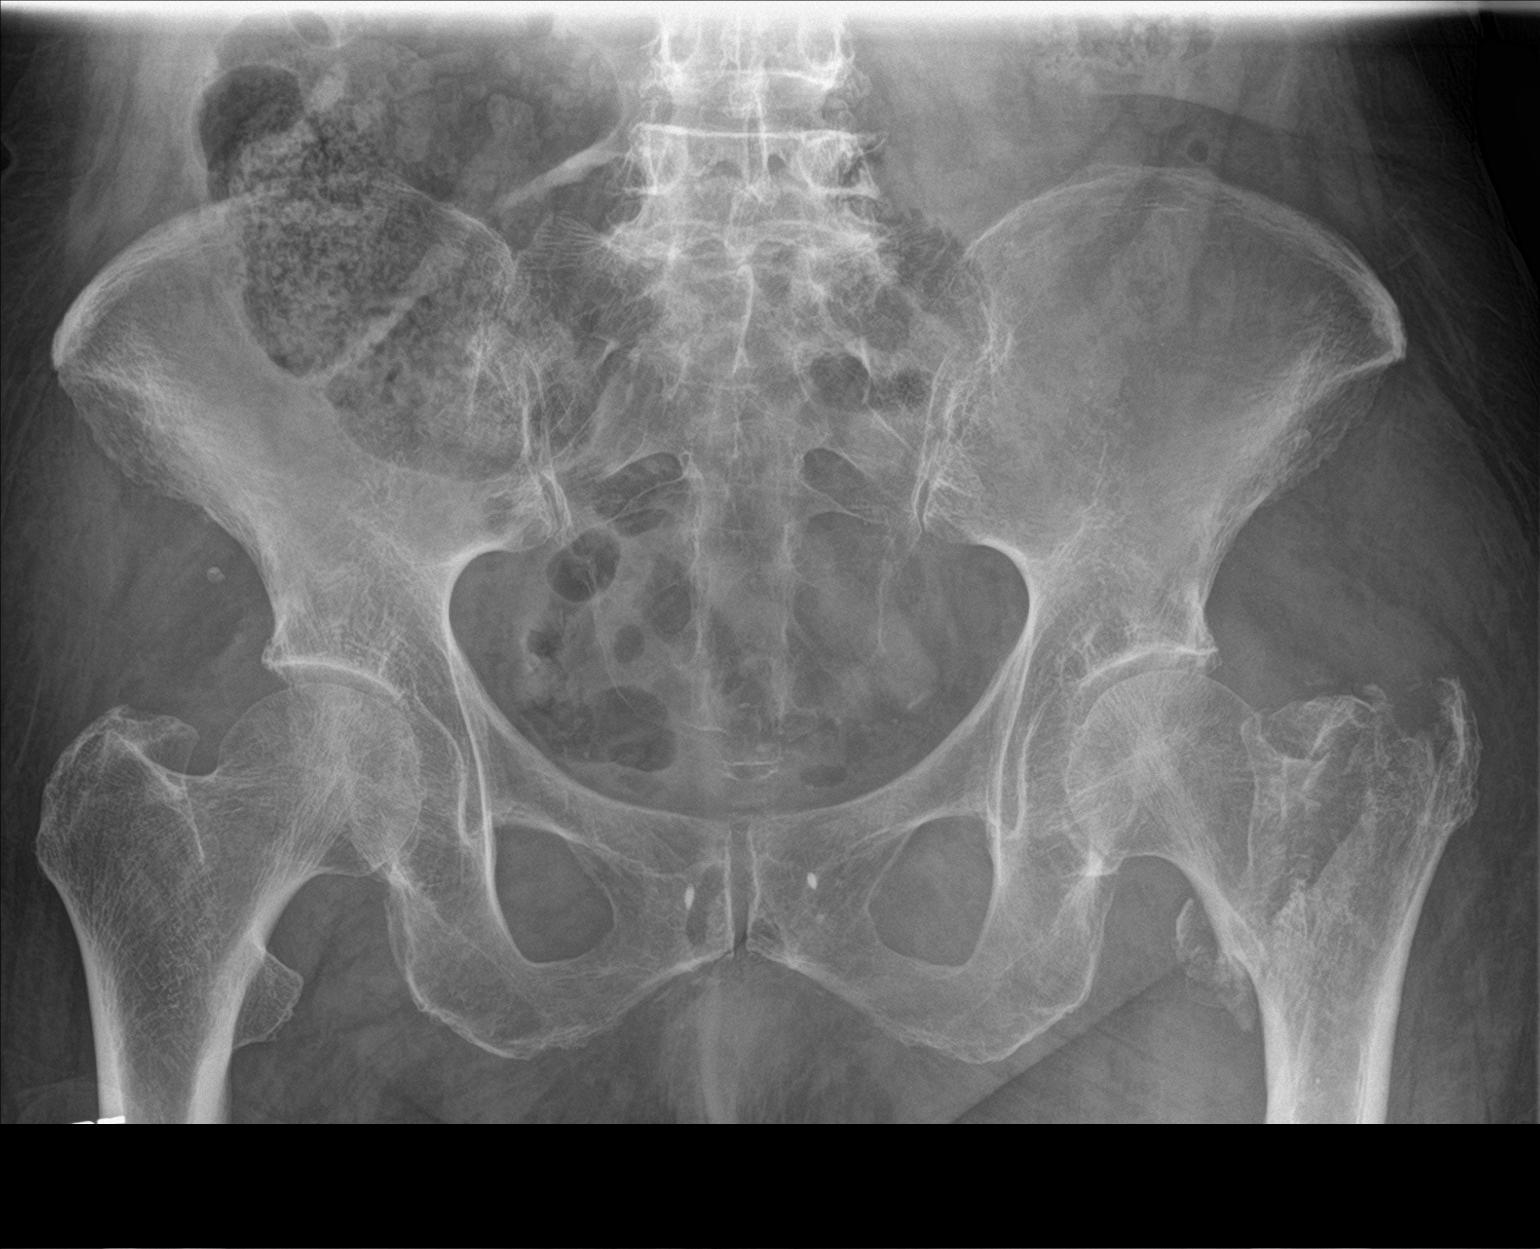

[1 of 1 positions shown; findings below may reference images not displayed]

FINDINGS: There is an acute comminuted mildly distracted fracture of the
intertrochanteric region of the left hip. The bones are subjectively
osteopenic. The iliac bones and pubic bones appear intact. The right
hip is grossly normal.
IMPRESSION: Acute intertrochanteric fracture of the left hip.

## 2019-09-12 IMAGING — CR DG FEMUR 2+V*L*
1 series · 4 of 4 positions shown · non-contrast
Comparison: AP view of the pelvis of today's date

CLINICAL DATA: Status post fall.  Known left hip fracture.

EXAM:
LEFT FEMUR 2 VIEWS

[Series 1: dg femur min 2 views left · 0.14mm/px · 4 of 4 slices shown]
[im 1/4]
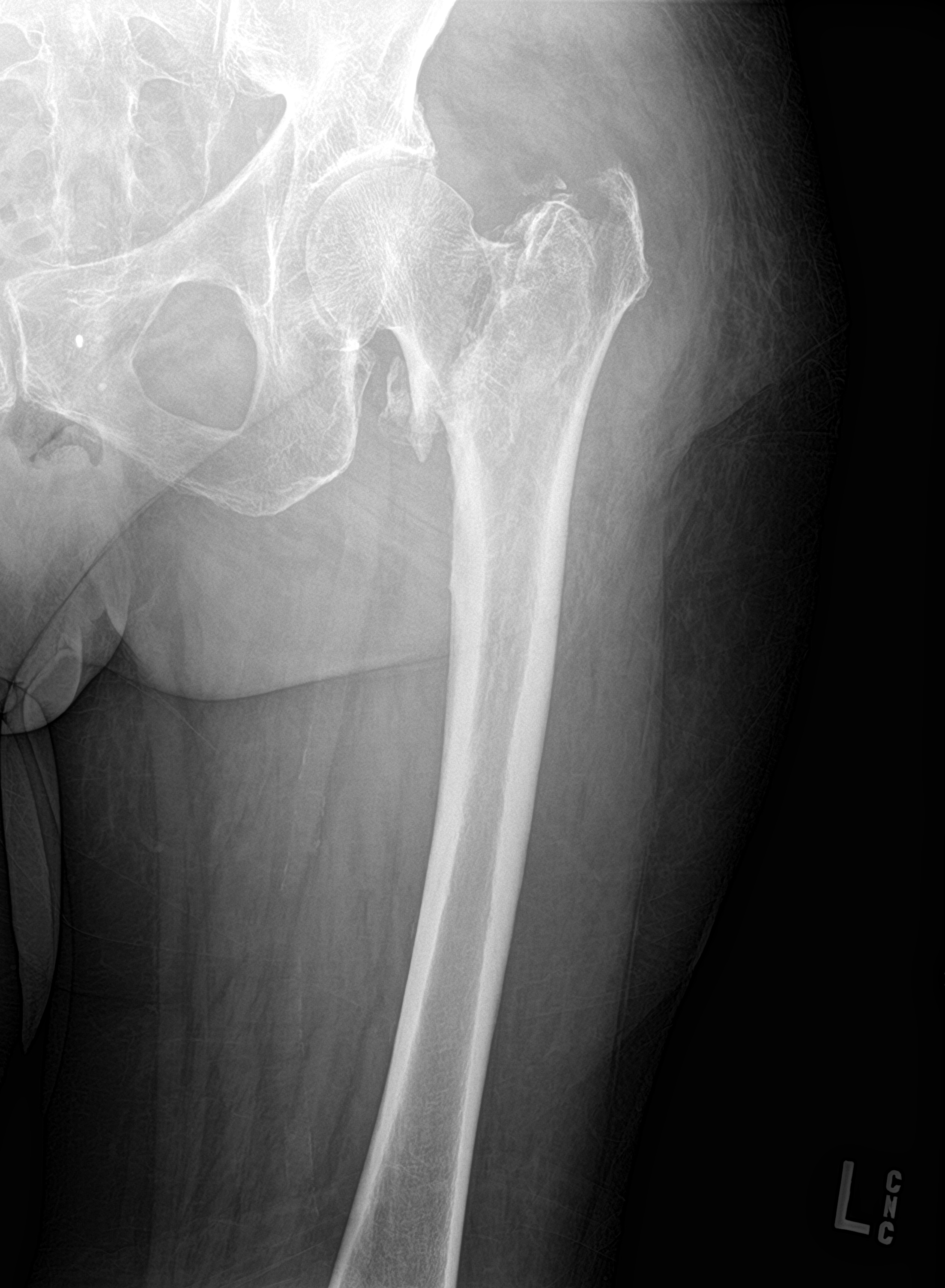
[im 2/4]
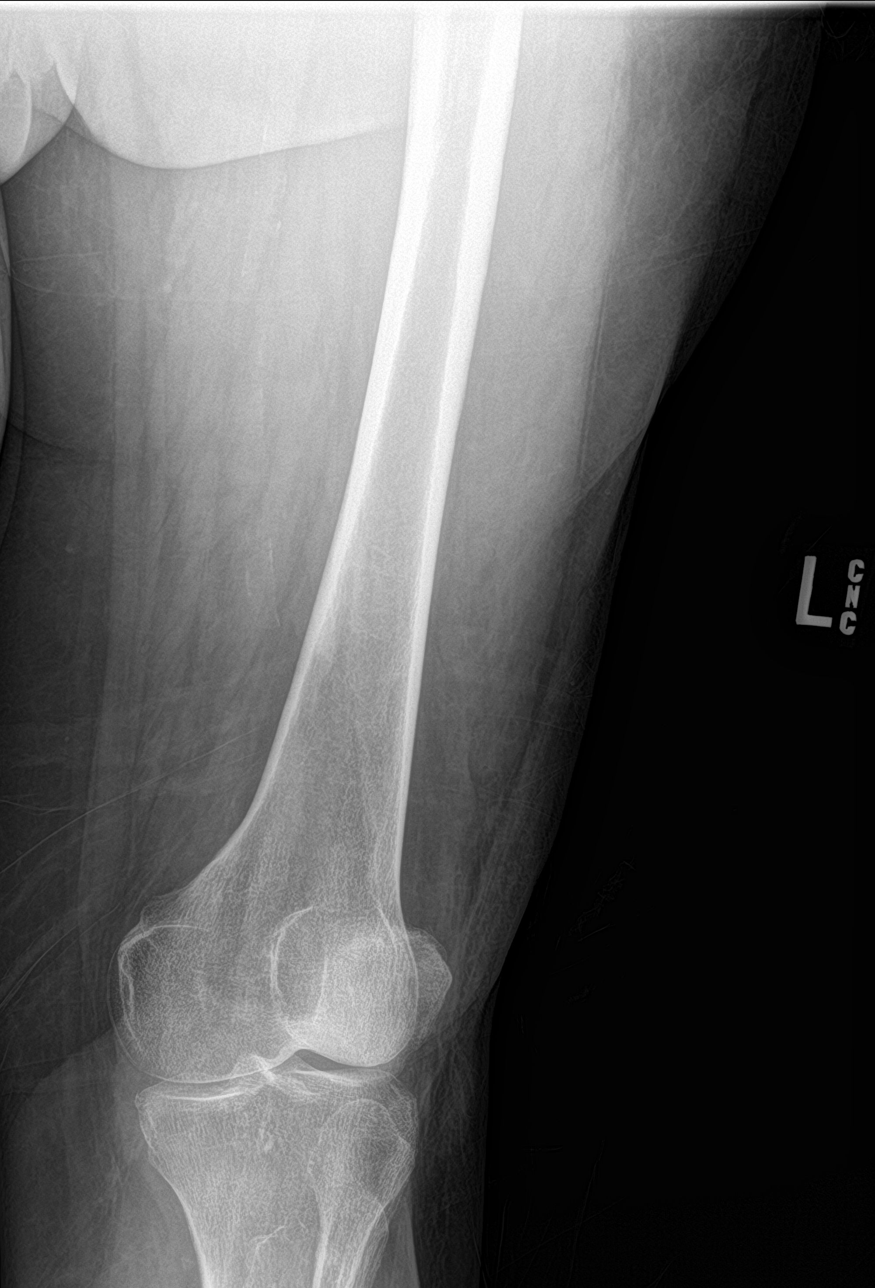
[im 3/4]
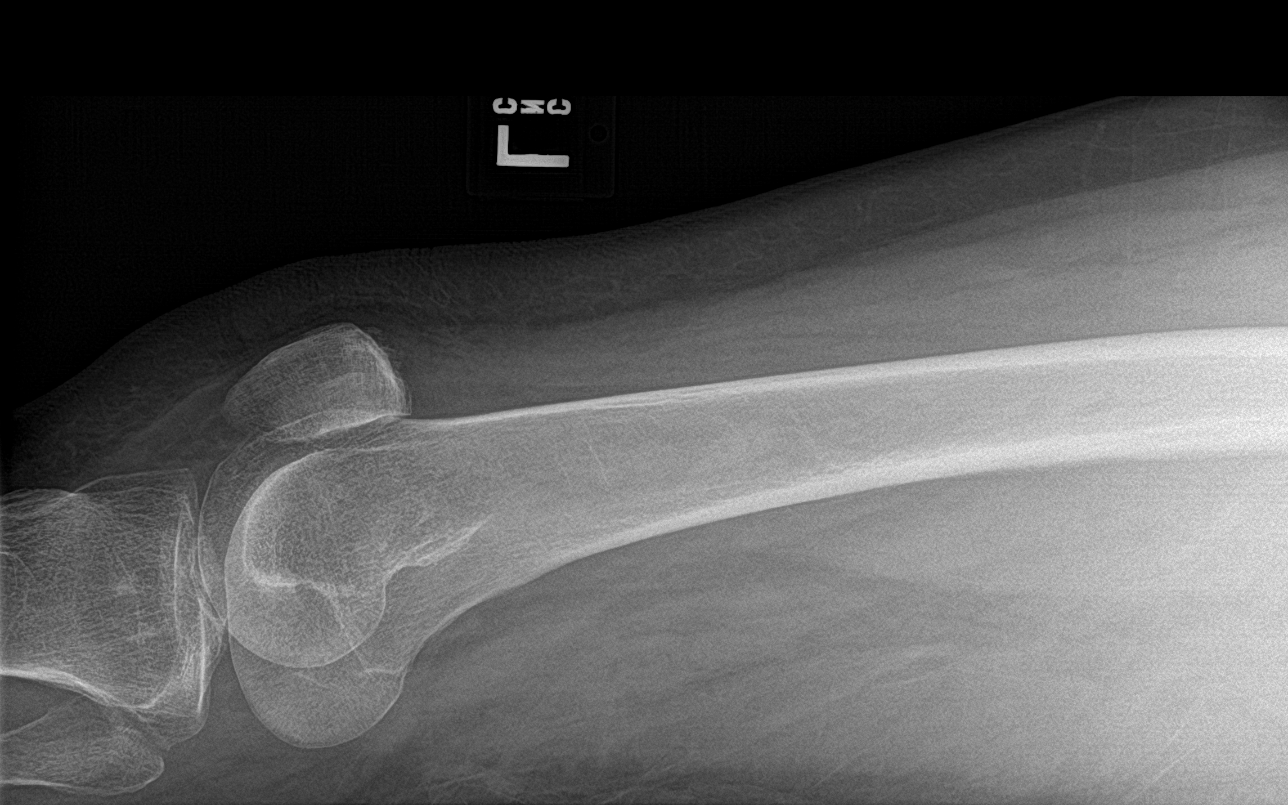
[im 4/4]
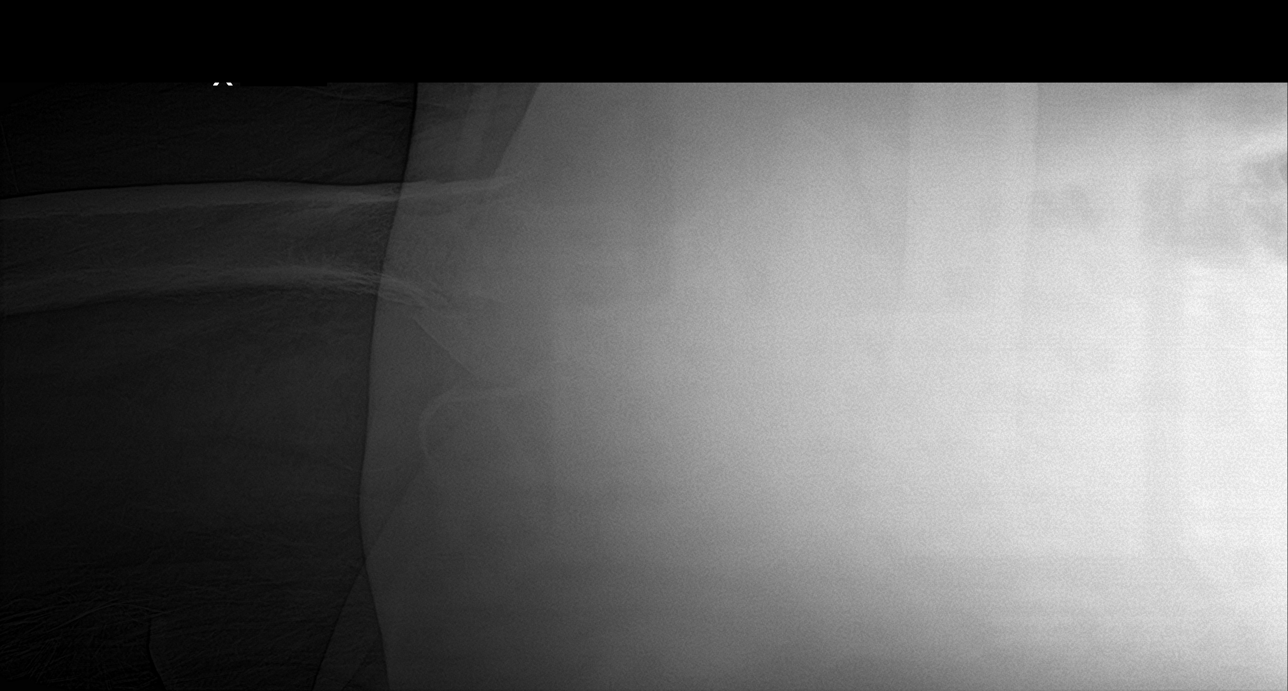

[4 of 4 positions shown; findings below may reference images not displayed]

FINDINGS: Again demonstrated is the comminuted and distracted
intertrochanteric fracture of the left hip. There is mild superior
migration of the femoral shaft with respect to the base of the
femoral neck. The femoral shaft is intact. The observed portions of
the left knee are within the limits of normal for age.
IMPRESSION: Acute comminuted distracted intertrochanteric fracture of the left
hip.

## 2019-09-12 IMAGING — XA DG HIP (WITH PELVIS) OPERATIVE*L*
6 series · 6 of 6 positions shown · non-contrast
Comparison: Preoperative study July 07, 2017

CLINICAL DATA: Status post ORIF for acute left hip fracture.

EXAM:
OPERATIVE left HIP (WITH PELVIS IF PERFORMED) 6 VIEWS
TECHNIQUE: Fluoroscopic spot image(s) were submitted for interpretation
post-operatively. Fluoro time reported is 1 minutes, 7 seconds

[Series 11: cont. · 1 of 1 slices shown (1 of 6)]
[im 1/1]
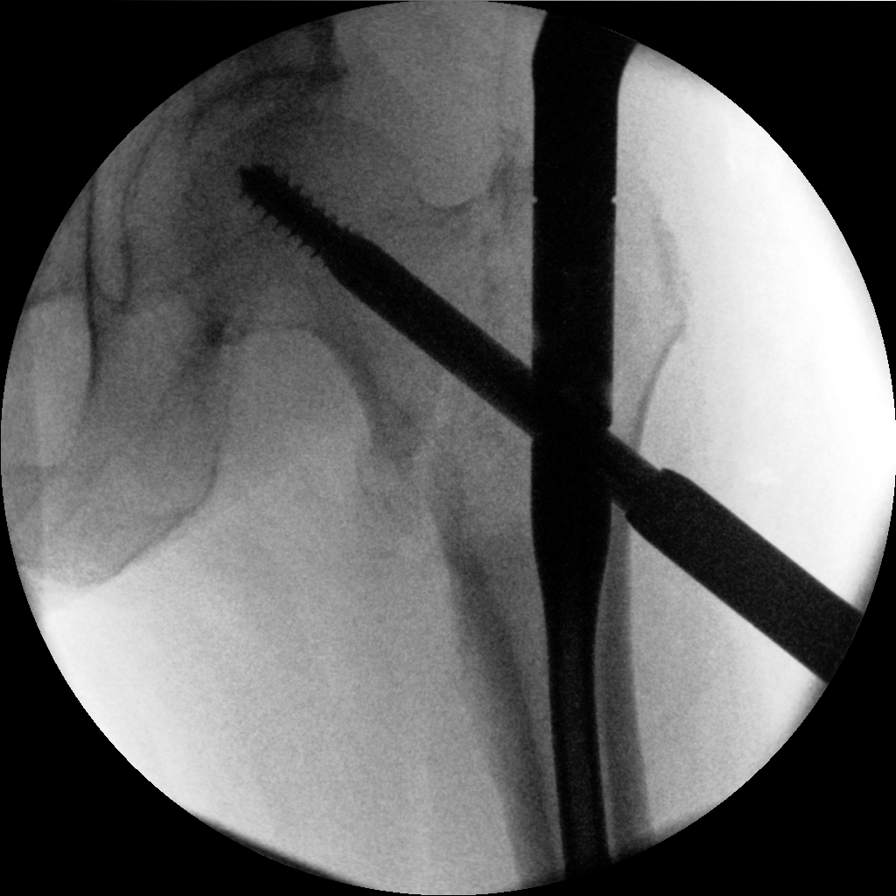

[Series 13: cont. · 1 of 1 slices shown (2 of 6)]
[im 1/1]
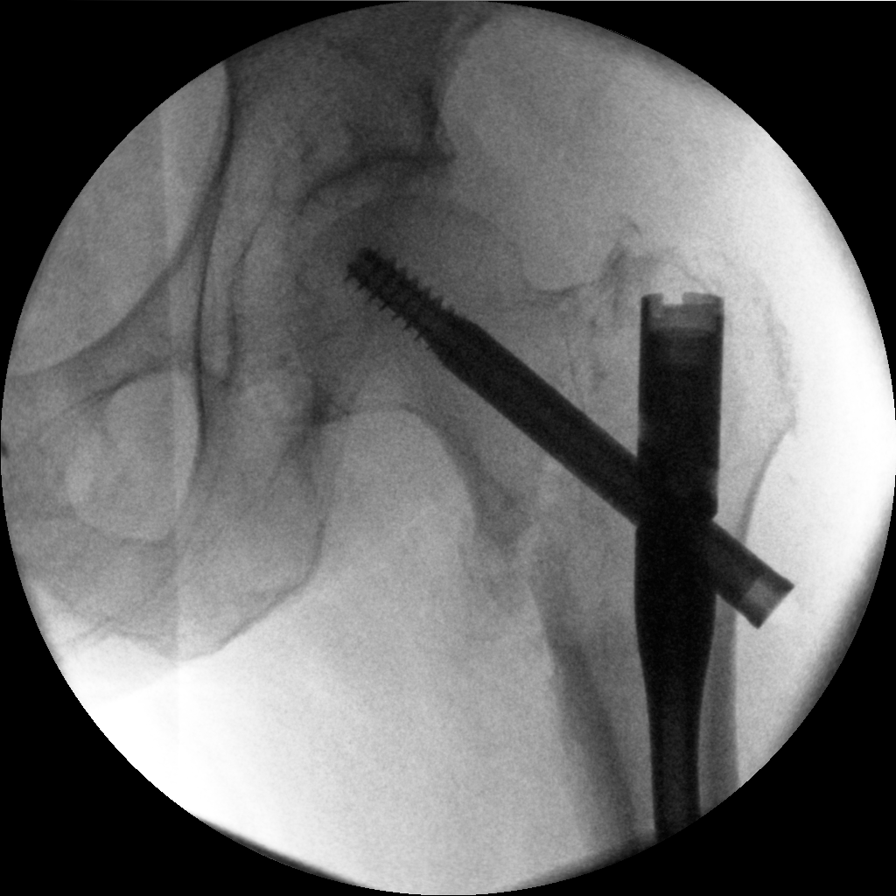

[Series 14: cont. · 1 of 1 slices shown (3 of 6)]
[im 1/1]
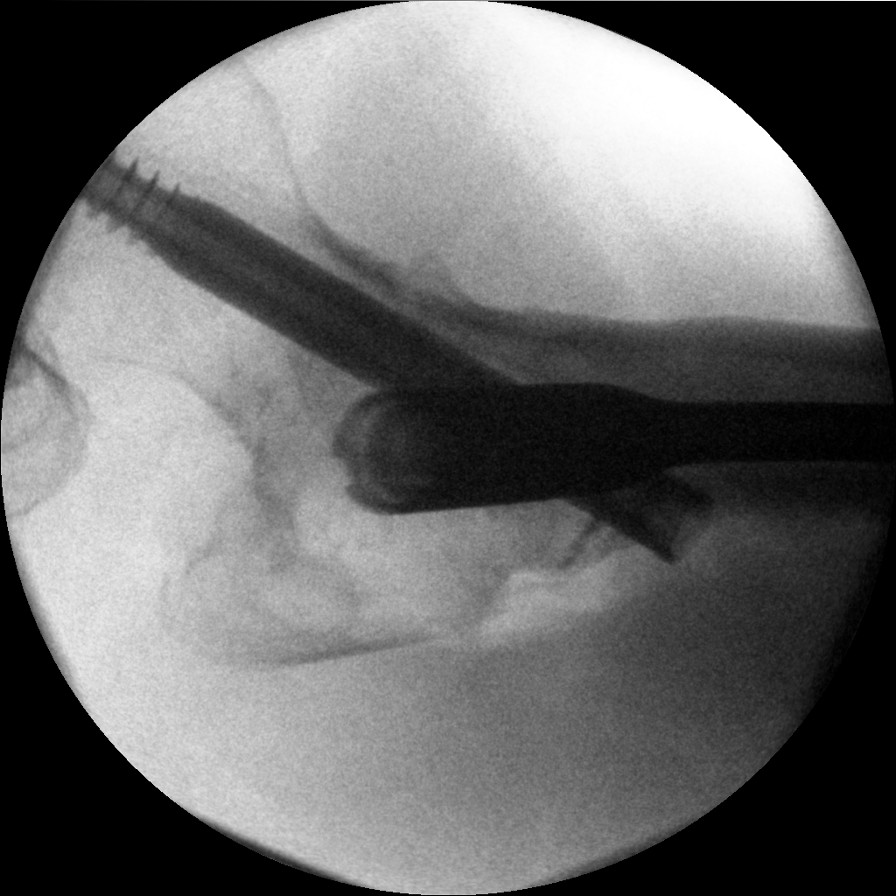

[Series 15: cont. · 1 of 1 slices shown (4 of 6)]
[im 1/1]
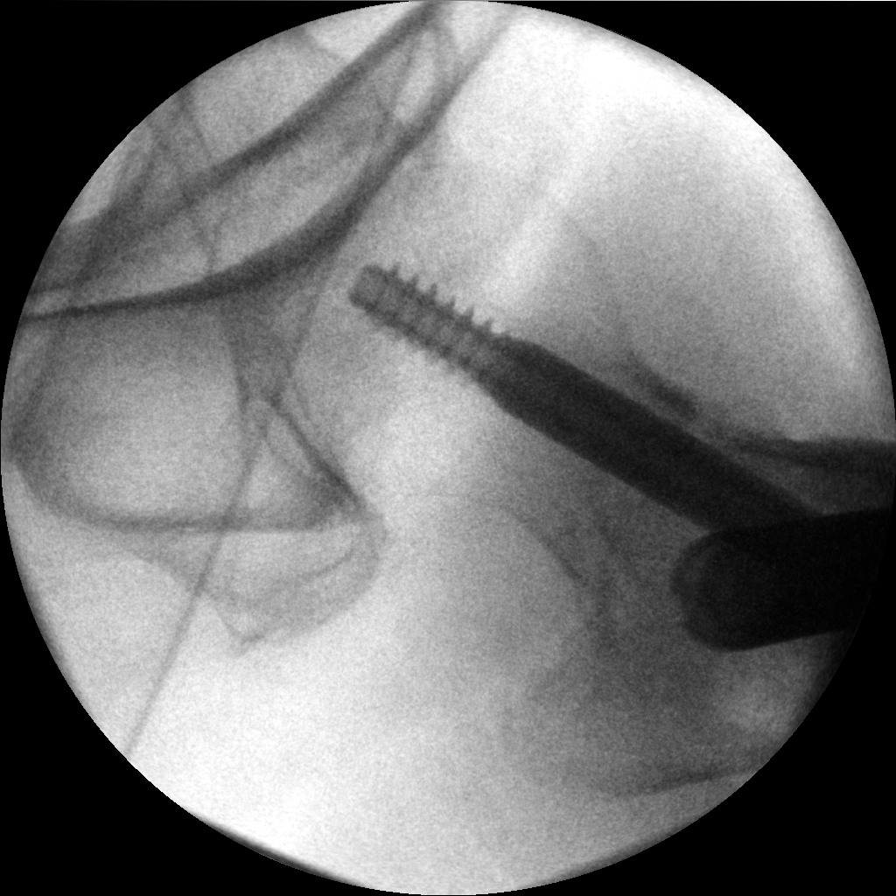

[Series 16: cont. · 1 of 1 slices shown (5 of 6)]
[im 1/1]
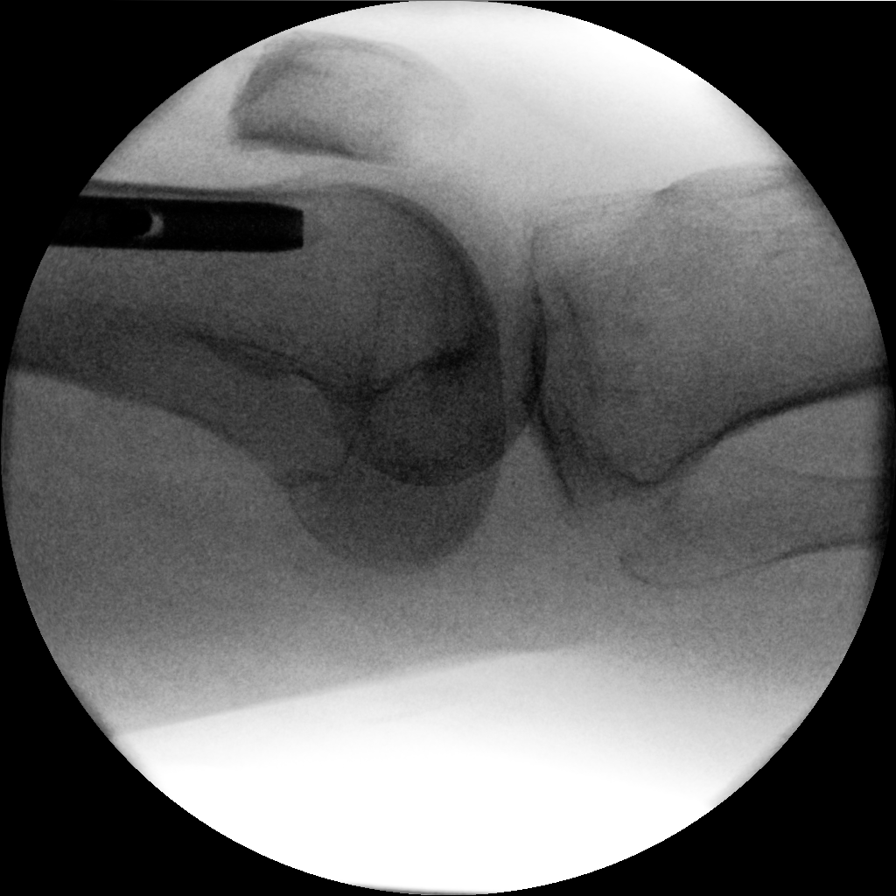

[Series 17: cont. · 1 of 1 slices shown (6 of 6)]
[im 1/1]
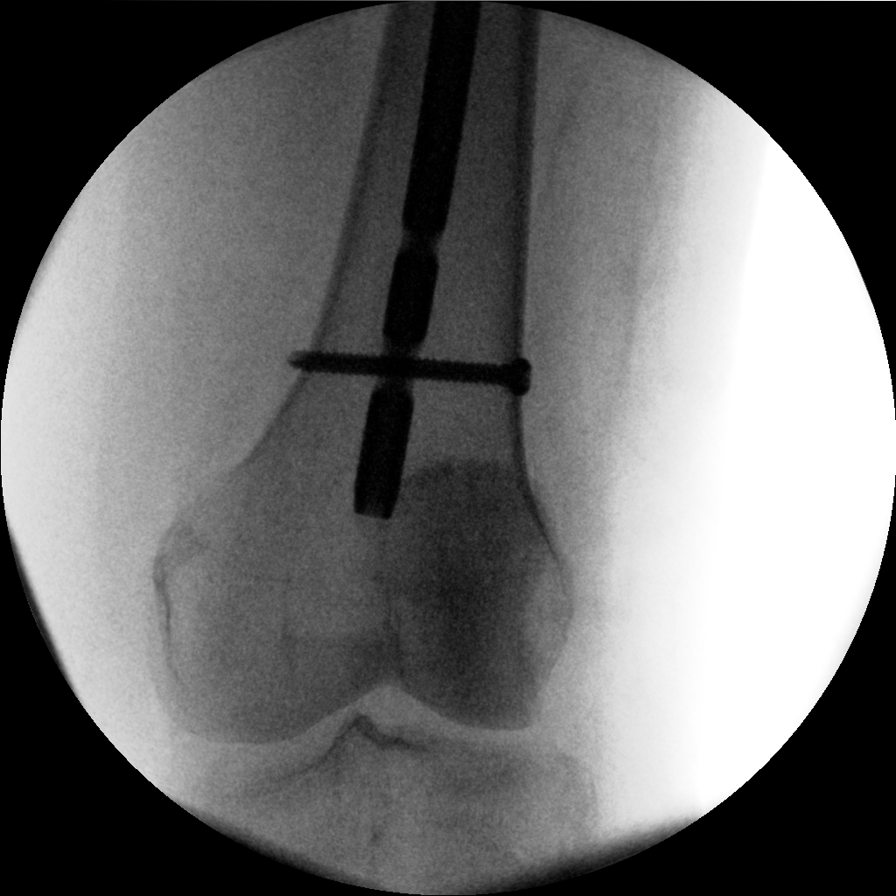

[6 of 6 positions shown; findings below may reference images not displayed]

FINDINGS: The patient has undergone intramedullary rod and telescoping screw
placement for fixation of a fracture of the intertrochanteric region
of the left hip. Radiographic positioning of the appliances is good.
The fracture fragments are now in near anatomic in alignment.
IMPRESSION: ORIF of an intertrochanteric fracture of the left hip.

## 2019-09-14 DIAGNOSIS — E538 Deficiency of other specified B group vitamins: Secondary | ICD-10-CM | POA: Diagnosis not present

## 2019-09-14 DIAGNOSIS — F331 Major depressive disorder, recurrent, moderate: Secondary | ICD-10-CM | POA: Diagnosis not present

## 2019-09-14 DIAGNOSIS — F0281 Dementia in other diseases classified elsewhere with behavioral disturbance: Secondary | ICD-10-CM | POA: Diagnosis not present

## 2019-09-14 DIAGNOSIS — G40909 Epilepsy, unspecified, not intractable, without status epilepticus: Secondary | ICD-10-CM | POA: Diagnosis not present

## 2019-09-14 DIAGNOSIS — G309 Alzheimer's disease, unspecified: Secondary | ICD-10-CM | POA: Diagnosis not present

## 2019-09-25 DIAGNOSIS — F0390 Unspecified dementia without behavioral disturbance: Secondary | ICD-10-CM | POA: Diagnosis not present

## 2019-09-25 DIAGNOSIS — R569 Unspecified convulsions: Secondary | ICD-10-CM | POA: Diagnosis not present

## 2019-09-25 DIAGNOSIS — Z86718 Personal history of other venous thrombosis and embolism: Secondary | ICD-10-CM | POA: Diagnosis not present

## 2019-09-25 DIAGNOSIS — E039 Hypothyroidism, unspecified: Secondary | ICD-10-CM | POA: Diagnosis not present

## 2019-09-25 DIAGNOSIS — F329 Major depressive disorder, single episode, unspecified: Secondary | ICD-10-CM | POA: Diagnosis not present

## 2019-09-27 DIAGNOSIS — M6281 Muscle weakness (generalized): Secondary | ICD-10-CM | POA: Diagnosis not present

## 2019-09-30 DIAGNOSIS — M6281 Muscle weakness (generalized): Secondary | ICD-10-CM | POA: Diagnosis not present

## 2019-10-01 DIAGNOSIS — M6281 Muscle weakness (generalized): Secondary | ICD-10-CM | POA: Diagnosis not present

## 2019-10-04 DIAGNOSIS — M6281 Muscle weakness (generalized): Secondary | ICD-10-CM | POA: Diagnosis not present

## 2019-10-05 DIAGNOSIS — M6281 Muscle weakness (generalized): Secondary | ICD-10-CM | POA: Diagnosis not present

## 2019-10-06 DIAGNOSIS — M6281 Muscle weakness (generalized): Secondary | ICD-10-CM | POA: Diagnosis not present

## 2019-10-07 DIAGNOSIS — Z7901 Long term (current) use of anticoagulants: Secondary | ICD-10-CM | POA: Diagnosis not present

## 2019-10-07 DIAGNOSIS — I10 Essential (primary) hypertension: Secondary | ICD-10-CM | POA: Diagnosis not present

## 2019-10-07 DIAGNOSIS — D649 Anemia, unspecified: Secondary | ICD-10-CM | POA: Diagnosis not present

## 2019-10-07 DIAGNOSIS — E039 Hypothyroidism, unspecified: Secondary | ICD-10-CM | POA: Diagnosis not present

## 2019-10-08 DIAGNOSIS — M6281 Muscle weakness (generalized): Secondary | ICD-10-CM | POA: Diagnosis not present

## 2019-10-11 DIAGNOSIS — M6281 Muscle weakness (generalized): Secondary | ICD-10-CM | POA: Diagnosis not present

## 2019-10-12 DIAGNOSIS — M6281 Muscle weakness (generalized): Secondary | ICD-10-CM | POA: Diagnosis not present

## 2019-10-13 DIAGNOSIS — M6281 Muscle weakness (generalized): Secondary | ICD-10-CM | POA: Diagnosis not present

## 2019-10-14 DIAGNOSIS — M6281 Muscle weakness (generalized): Secondary | ICD-10-CM | POA: Diagnosis not present

## 2019-11-04 DIAGNOSIS — I2699 Other pulmonary embolism without acute cor pulmonale: Secondary | ICD-10-CM | POA: Diagnosis not present

## 2019-11-09 DIAGNOSIS — F0281 Dementia in other diseases classified elsewhere with behavioral disturbance: Secondary | ICD-10-CM | POA: Diagnosis not present

## 2019-11-09 DIAGNOSIS — F331 Major depressive disorder, recurrent, moderate: Secondary | ICD-10-CM | POA: Diagnosis not present

## 2019-11-09 DIAGNOSIS — G40909 Epilepsy, unspecified, not intractable, without status epilepticus: Secondary | ICD-10-CM | POA: Diagnosis not present

## 2019-11-09 DIAGNOSIS — G309 Alzheimer's disease, unspecified: Secondary | ICD-10-CM | POA: Diagnosis not present

## 2019-11-16 DIAGNOSIS — Z794 Long term (current) use of insulin: Secondary | ICD-10-CM | POA: Diagnosis not present

## 2019-11-16 DIAGNOSIS — B351 Tinea unguium: Secondary | ICD-10-CM | POA: Diagnosis not present

## 2019-11-16 DIAGNOSIS — E1151 Type 2 diabetes mellitus with diabetic peripheral angiopathy without gangrene: Secondary | ICD-10-CM | POA: Diagnosis not present

## 2019-11-16 DIAGNOSIS — L603 Nail dystrophy: Secondary | ICD-10-CM | POA: Diagnosis not present

## 2019-11-16 DIAGNOSIS — R262 Difficulty in walking, not elsewhere classified: Secondary | ICD-10-CM | POA: Diagnosis not present

## 2019-11-16 DIAGNOSIS — L853 Xerosis cutis: Secondary | ICD-10-CM | POA: Diagnosis not present

## 2019-11-23 DIAGNOSIS — F32A Depression, unspecified: Secondary | ICD-10-CM | POA: Diagnosis not present

## 2019-11-23 DIAGNOSIS — E039 Hypothyroidism, unspecified: Secondary | ICD-10-CM | POA: Diagnosis not present

## 2019-11-23 DIAGNOSIS — F0390 Unspecified dementia without behavioral disturbance: Secondary | ICD-10-CM | POA: Diagnosis not present

## 2019-11-23 DIAGNOSIS — R569 Unspecified convulsions: Secondary | ICD-10-CM | POA: Diagnosis not present

## 2019-11-29 DIAGNOSIS — Z86711 Personal history of pulmonary embolism: Secondary | ICD-10-CM | POA: Diagnosis not present

## 2019-11-29 DIAGNOSIS — F3289 Other specified depressive episodes: Secondary | ICD-10-CM | POA: Diagnosis not present

## 2019-11-29 DIAGNOSIS — Z23 Encounter for immunization: Secondary | ICD-10-CM | POA: Diagnosis not present

## 2019-11-29 DIAGNOSIS — K5900 Constipation, unspecified: Secondary | ICD-10-CM | POA: Diagnosis not present

## 2019-11-29 DIAGNOSIS — F0390 Unspecified dementia without behavioral disturbance: Secondary | ICD-10-CM | POA: Diagnosis not present

## 2019-11-29 DIAGNOSIS — R569 Unspecified convulsions: Secondary | ICD-10-CM | POA: Diagnosis not present

## 2019-11-29 DIAGNOSIS — K219 Gastro-esophageal reflux disease without esophagitis: Secondary | ICD-10-CM | POA: Diagnosis not present

## 2019-11-29 DIAGNOSIS — I10 Essential (primary) hypertension: Secondary | ICD-10-CM | POA: Diagnosis not present

## 2019-11-29 DIAGNOSIS — E039 Hypothyroidism, unspecified: Secondary | ICD-10-CM | POA: Diagnosis not present

## 2019-12-02 DIAGNOSIS — Z7901 Long term (current) use of anticoagulants: Secondary | ICD-10-CM | POA: Diagnosis not present

## 2019-12-14 DIAGNOSIS — F0281 Dementia in other diseases classified elsewhere with behavioral disturbance: Secondary | ICD-10-CM | POA: Diagnosis not present

## 2019-12-14 DIAGNOSIS — R41841 Cognitive communication deficit: Secondary | ICD-10-CM | POA: Diagnosis not present

## 2019-12-14 DIAGNOSIS — F331 Major depressive disorder, recurrent, moderate: Secondary | ICD-10-CM | POA: Diagnosis not present

## 2019-12-14 DIAGNOSIS — G40909 Epilepsy, unspecified, not intractable, without status epilepticus: Secondary | ICD-10-CM | POA: Diagnosis not present

## 2019-12-14 DIAGNOSIS — G301 Alzheimer's disease with late onset: Secondary | ICD-10-CM | POA: Diagnosis not present

## 2019-12-14 DIAGNOSIS — I480 Paroxysmal atrial fibrillation: Secondary | ICD-10-CM | POA: Diagnosis not present

## 2019-12-16 DIAGNOSIS — F32A Depression, unspecified: Secondary | ICD-10-CM | POA: Diagnosis not present

## 2019-12-16 DIAGNOSIS — Z86718 Personal history of other venous thrombosis and embolism: Secondary | ICD-10-CM | POA: Diagnosis not present

## 2019-12-16 DIAGNOSIS — R569 Unspecified convulsions: Secondary | ICD-10-CM | POA: Diagnosis not present

## 2019-12-16 DIAGNOSIS — F0390 Unspecified dementia without behavioral disturbance: Secondary | ICD-10-CM | POA: Diagnosis not present

## 2019-12-16 DIAGNOSIS — E039 Hypothyroidism, unspecified: Secondary | ICD-10-CM | POA: Diagnosis not present

## 2019-12-18 DIAGNOSIS — I480 Paroxysmal atrial fibrillation: Secondary | ICD-10-CM | POA: Diagnosis not present

## 2019-12-20 DIAGNOSIS — F32A Depression, unspecified: Secondary | ICD-10-CM | POA: Diagnosis not present

## 2019-12-20 DIAGNOSIS — Z86718 Personal history of other venous thrombosis and embolism: Secondary | ICD-10-CM | POA: Diagnosis not present

## 2019-12-20 DIAGNOSIS — E039 Hypothyroidism, unspecified: Secondary | ICD-10-CM | POA: Diagnosis not present

## 2019-12-20 DIAGNOSIS — R569 Unspecified convulsions: Secondary | ICD-10-CM | POA: Diagnosis not present

## 2019-12-20 DIAGNOSIS — F0390 Unspecified dementia without behavioral disturbance: Secondary | ICD-10-CM | POA: Diagnosis not present

## 2019-12-21 DIAGNOSIS — I10 Essential (primary) hypertension: Secondary | ICD-10-CM | POA: Diagnosis not present

## 2019-12-21 DIAGNOSIS — E039 Hypothyroidism, unspecified: Secondary | ICD-10-CM | POA: Diagnosis not present

## 2019-12-21 DIAGNOSIS — I482 Chronic atrial fibrillation, unspecified: Secondary | ICD-10-CM | POA: Diagnosis not present

## 2019-12-21 DIAGNOSIS — I480 Paroxysmal atrial fibrillation: Secondary | ICD-10-CM | POA: Diagnosis not present

## 2019-12-24 DIAGNOSIS — I480 Paroxysmal atrial fibrillation: Secondary | ICD-10-CM | POA: Diagnosis not present

## 2019-12-27 DIAGNOSIS — E039 Hypothyroidism, unspecified: Secondary | ICD-10-CM | POA: Diagnosis not present

## 2019-12-27 DIAGNOSIS — R569 Unspecified convulsions: Secondary | ICD-10-CM | POA: Diagnosis not present

## 2019-12-27 DIAGNOSIS — F0390 Unspecified dementia without behavioral disturbance: Secondary | ICD-10-CM | POA: Diagnosis not present

## 2019-12-27 DIAGNOSIS — Z86718 Personal history of other venous thrombosis and embolism: Secondary | ICD-10-CM | POA: Diagnosis not present

## 2019-12-27 DIAGNOSIS — Z7901 Long term (current) use of anticoagulants: Secondary | ICD-10-CM | POA: Diagnosis not present

## 2019-12-27 DIAGNOSIS — F32A Depression, unspecified: Secondary | ICD-10-CM | POA: Diagnosis not present

## 2019-12-29 DIAGNOSIS — M6281 Muscle weakness (generalized): Secondary | ICD-10-CM | POA: Diagnosis not present

## 2019-12-29 DIAGNOSIS — F3289 Other specified depressive episodes: Secondary | ICD-10-CM | POA: Diagnosis not present

## 2019-12-29 DIAGNOSIS — Z86718 Personal history of other venous thrombosis and embolism: Secondary | ICD-10-CM | POA: Diagnosis not present

## 2019-12-29 DIAGNOSIS — I10 Essential (primary) hypertension: Secondary | ICD-10-CM | POA: Diagnosis not present

## 2019-12-29 DIAGNOSIS — K219 Gastro-esophageal reflux disease without esophagitis: Secondary | ICD-10-CM | POA: Diagnosis not present

## 2019-12-29 DIAGNOSIS — R569 Unspecified convulsions: Secondary | ICD-10-CM | POA: Diagnosis not present

## 2019-12-29 DIAGNOSIS — E039 Hypothyroidism, unspecified: Secondary | ICD-10-CM | POA: Diagnosis not present

## 2019-12-29 DIAGNOSIS — I82412 Acute embolism and thrombosis of left femoral vein: Secondary | ICD-10-CM | POA: Diagnosis not present

## 2019-12-29 DIAGNOSIS — Z7901 Long term (current) use of anticoagulants: Secondary | ICD-10-CM | POA: Diagnosis not present

## 2019-12-29 DIAGNOSIS — F0390 Unspecified dementia without behavioral disturbance: Secondary | ICD-10-CM | POA: Diagnosis not present

## 2019-12-29 DIAGNOSIS — N390 Urinary tract infection, site not specified: Secondary | ICD-10-CM | POA: Diagnosis not present

## 2019-12-29 DIAGNOSIS — R2681 Unsteadiness on feet: Secondary | ICD-10-CM | POA: Diagnosis not present

## 2019-12-29 DIAGNOSIS — F32A Depression, unspecified: Secondary | ICD-10-CM | POA: Diagnosis not present

## 2019-12-30 DIAGNOSIS — Z7901 Long term (current) use of anticoagulants: Secondary | ICD-10-CM | POA: Diagnosis not present

## 2019-12-31 DIAGNOSIS — I480 Paroxysmal atrial fibrillation: Secondary | ICD-10-CM | POA: Diagnosis not present

## 2019-12-31 DIAGNOSIS — Z86718 Personal history of other venous thrombosis and embolism: Secondary | ICD-10-CM | POA: Diagnosis not present

## 2019-12-31 DIAGNOSIS — F32A Depression, unspecified: Secondary | ICD-10-CM | POA: Diagnosis not present

## 2019-12-31 DIAGNOSIS — R569 Unspecified convulsions: Secondary | ICD-10-CM | POA: Diagnosis not present

## 2020-01-03 DIAGNOSIS — K219 Gastro-esophageal reflux disease without esophagitis: Secondary | ICD-10-CM | POA: Diagnosis not present

## 2020-01-03 DIAGNOSIS — F0390 Unspecified dementia without behavioral disturbance: Secondary | ICD-10-CM | POA: Diagnosis not present

## 2020-01-03 DIAGNOSIS — E039 Hypothyroidism, unspecified: Secondary | ICD-10-CM | POA: Diagnosis not present

## 2020-01-03 DIAGNOSIS — N390 Urinary tract infection, site not specified: Secondary | ICD-10-CM | POA: Diagnosis not present

## 2020-01-03 DIAGNOSIS — R569 Unspecified convulsions: Secondary | ICD-10-CM | POA: Diagnosis not present

## 2020-01-03 DIAGNOSIS — R2681 Unsteadiness on feet: Secondary | ICD-10-CM | POA: Diagnosis not present

## 2020-01-03 DIAGNOSIS — I10 Essential (primary) hypertension: Secondary | ICD-10-CM | POA: Diagnosis not present

## 2020-01-03 DIAGNOSIS — F3289 Other specified depressive episodes: Secondary | ICD-10-CM | POA: Diagnosis not present

## 2020-01-03 DIAGNOSIS — M6281 Muscle weakness (generalized): Secondary | ICD-10-CM | POA: Diagnosis not present

## 2020-01-04 DIAGNOSIS — F0390 Unspecified dementia without behavioral disturbance: Secondary | ICD-10-CM | POA: Diagnosis not present

## 2020-01-04 DIAGNOSIS — M6281 Muscle weakness (generalized): Secondary | ICD-10-CM | POA: Diagnosis not present

## 2020-01-04 DIAGNOSIS — R2681 Unsteadiness on feet: Secondary | ICD-10-CM | POA: Diagnosis not present

## 2020-01-04 DIAGNOSIS — E039 Hypothyroidism, unspecified: Secondary | ICD-10-CM | POA: Diagnosis not present

## 2020-01-04 DIAGNOSIS — I10 Essential (primary) hypertension: Secondary | ICD-10-CM | POA: Diagnosis not present

## 2020-01-04 DIAGNOSIS — R569 Unspecified convulsions: Secondary | ICD-10-CM | POA: Diagnosis not present

## 2020-01-04 DIAGNOSIS — F3289 Other specified depressive episodes: Secondary | ICD-10-CM | POA: Diagnosis not present

## 2020-01-04 DIAGNOSIS — N390 Urinary tract infection, site not specified: Secondary | ICD-10-CM | POA: Diagnosis not present

## 2020-01-04 DIAGNOSIS — K219 Gastro-esophageal reflux disease without esophagitis: Secondary | ICD-10-CM | POA: Diagnosis not present

## 2020-01-05 DIAGNOSIS — R569 Unspecified convulsions: Secondary | ICD-10-CM | POA: Diagnosis not present

## 2020-01-05 DIAGNOSIS — R2681 Unsteadiness on feet: Secondary | ICD-10-CM | POA: Diagnosis not present

## 2020-01-05 DIAGNOSIS — I10 Essential (primary) hypertension: Secondary | ICD-10-CM | POA: Diagnosis not present

## 2020-01-05 DIAGNOSIS — E039 Hypothyroidism, unspecified: Secondary | ICD-10-CM | POA: Diagnosis not present

## 2020-01-05 DIAGNOSIS — F3289 Other specified depressive episodes: Secondary | ICD-10-CM | POA: Diagnosis not present

## 2020-01-05 DIAGNOSIS — F0390 Unspecified dementia without behavioral disturbance: Secondary | ICD-10-CM | POA: Diagnosis not present

## 2020-01-05 DIAGNOSIS — N390 Urinary tract infection, site not specified: Secondary | ICD-10-CM | POA: Diagnosis not present

## 2020-01-05 DIAGNOSIS — K219 Gastro-esophageal reflux disease without esophagitis: Secondary | ICD-10-CM | POA: Diagnosis not present

## 2020-01-05 DIAGNOSIS — M6281 Muscle weakness (generalized): Secondary | ICD-10-CM | POA: Diagnosis not present

## 2020-01-06 DIAGNOSIS — F3289 Other specified depressive episodes: Secondary | ICD-10-CM | POA: Diagnosis not present

## 2020-01-06 DIAGNOSIS — M6281 Muscle weakness (generalized): Secondary | ICD-10-CM | POA: Diagnosis not present

## 2020-01-06 DIAGNOSIS — F0390 Unspecified dementia without behavioral disturbance: Secondary | ICD-10-CM | POA: Diagnosis not present

## 2020-01-06 DIAGNOSIS — N390 Urinary tract infection, site not specified: Secondary | ICD-10-CM | POA: Diagnosis not present

## 2020-01-06 DIAGNOSIS — E039 Hypothyroidism, unspecified: Secondary | ICD-10-CM | POA: Diagnosis not present

## 2020-01-06 DIAGNOSIS — R569 Unspecified convulsions: Secondary | ICD-10-CM | POA: Diagnosis not present

## 2020-01-06 DIAGNOSIS — I10 Essential (primary) hypertension: Secondary | ICD-10-CM | POA: Diagnosis not present

## 2020-01-06 DIAGNOSIS — K219 Gastro-esophageal reflux disease without esophagitis: Secondary | ICD-10-CM | POA: Diagnosis not present

## 2020-01-06 DIAGNOSIS — R2681 Unsteadiness on feet: Secondary | ICD-10-CM | POA: Diagnosis not present

## 2020-01-09 DIAGNOSIS — N390 Urinary tract infection, site not specified: Secondary | ICD-10-CM | POA: Diagnosis not present

## 2020-01-09 DIAGNOSIS — M6281 Muscle weakness (generalized): Secondary | ICD-10-CM | POA: Diagnosis not present

## 2020-01-09 DIAGNOSIS — K219 Gastro-esophageal reflux disease without esophagitis: Secondary | ICD-10-CM | POA: Diagnosis not present

## 2020-01-09 DIAGNOSIS — I10 Essential (primary) hypertension: Secondary | ICD-10-CM | POA: Diagnosis not present

## 2020-01-09 DIAGNOSIS — R2681 Unsteadiness on feet: Secondary | ICD-10-CM | POA: Diagnosis not present

## 2020-01-09 DIAGNOSIS — F3289 Other specified depressive episodes: Secondary | ICD-10-CM | POA: Diagnosis not present

## 2020-01-09 DIAGNOSIS — R569 Unspecified convulsions: Secondary | ICD-10-CM | POA: Diagnosis not present

## 2020-01-09 DIAGNOSIS — E039 Hypothyroidism, unspecified: Secondary | ICD-10-CM | POA: Diagnosis not present

## 2020-01-09 DIAGNOSIS — F0390 Unspecified dementia without behavioral disturbance: Secondary | ICD-10-CM | POA: Diagnosis not present

## 2020-01-10 DIAGNOSIS — R569 Unspecified convulsions: Secondary | ICD-10-CM | POA: Diagnosis not present

## 2020-01-10 DIAGNOSIS — F0390 Unspecified dementia without behavioral disturbance: Secondary | ICD-10-CM | POA: Diagnosis not present

## 2020-01-10 DIAGNOSIS — N390 Urinary tract infection, site not specified: Secondary | ICD-10-CM | POA: Diagnosis not present

## 2020-01-10 DIAGNOSIS — K219 Gastro-esophageal reflux disease without esophagitis: Secondary | ICD-10-CM | POA: Diagnosis not present

## 2020-01-10 DIAGNOSIS — E039 Hypothyroidism, unspecified: Secondary | ICD-10-CM | POA: Diagnosis not present

## 2020-01-10 DIAGNOSIS — F3289 Other specified depressive episodes: Secondary | ICD-10-CM | POA: Diagnosis not present

## 2020-01-10 DIAGNOSIS — R2681 Unsteadiness on feet: Secondary | ICD-10-CM | POA: Diagnosis not present

## 2020-01-10 DIAGNOSIS — M6281 Muscle weakness (generalized): Secondary | ICD-10-CM | POA: Diagnosis not present

## 2020-01-10 DIAGNOSIS — I10 Essential (primary) hypertension: Secondary | ICD-10-CM | POA: Diagnosis not present

## 2020-01-11 DIAGNOSIS — M6281 Muscle weakness (generalized): Secondary | ICD-10-CM | POA: Diagnosis not present

## 2020-01-11 DIAGNOSIS — E039 Hypothyroidism, unspecified: Secondary | ICD-10-CM | POA: Diagnosis not present

## 2020-01-11 DIAGNOSIS — N390 Urinary tract infection, site not specified: Secondary | ICD-10-CM | POA: Diagnosis not present

## 2020-01-11 DIAGNOSIS — R569 Unspecified convulsions: Secondary | ICD-10-CM | POA: Diagnosis not present

## 2020-01-11 DIAGNOSIS — I10 Essential (primary) hypertension: Secondary | ICD-10-CM | POA: Diagnosis not present

## 2020-01-11 DIAGNOSIS — F0390 Unspecified dementia without behavioral disturbance: Secondary | ICD-10-CM | POA: Diagnosis not present

## 2020-01-11 DIAGNOSIS — K219 Gastro-esophageal reflux disease without esophagitis: Secondary | ICD-10-CM | POA: Diagnosis not present

## 2020-01-11 DIAGNOSIS — F3289 Other specified depressive episodes: Secondary | ICD-10-CM | POA: Diagnosis not present

## 2020-01-11 DIAGNOSIS — R2681 Unsteadiness on feet: Secondary | ICD-10-CM | POA: Diagnosis not present

## 2020-01-12 DIAGNOSIS — R2681 Unsteadiness on feet: Secondary | ICD-10-CM | POA: Diagnosis not present

## 2020-01-12 DIAGNOSIS — M6281 Muscle weakness (generalized): Secondary | ICD-10-CM | POA: Diagnosis not present

## 2020-01-18 DIAGNOSIS — G40909 Epilepsy, unspecified, not intractable, without status epilepticus: Secondary | ICD-10-CM | POA: Diagnosis not present

## 2020-01-18 DIAGNOSIS — F0281 Dementia in other diseases classified elsewhere with behavioral disturbance: Secondary | ICD-10-CM | POA: Diagnosis not present

## 2020-01-18 DIAGNOSIS — G301 Alzheimer's disease with late onset: Secondary | ICD-10-CM | POA: Diagnosis not present

## 2020-01-18 DIAGNOSIS — R41841 Cognitive communication deficit: Secondary | ICD-10-CM | POA: Diagnosis not present

## 2020-01-18 DIAGNOSIS — F331 Major depressive disorder, recurrent, moderate: Secondary | ICD-10-CM | POA: Diagnosis not present

## 2020-01-25 DIAGNOSIS — F32A Depression, unspecified: Secondary | ICD-10-CM | POA: Diagnosis not present

## 2020-01-25 DIAGNOSIS — Z86718 Personal history of other venous thrombosis and embolism: Secondary | ICD-10-CM | POA: Diagnosis not present

## 2020-01-25 DIAGNOSIS — R569 Unspecified convulsions: Secondary | ICD-10-CM | POA: Diagnosis not present

## 2020-01-25 DIAGNOSIS — E039 Hypothyroidism, unspecified: Secondary | ICD-10-CM | POA: Diagnosis not present

## 2020-01-25 DIAGNOSIS — F0390 Unspecified dementia without behavioral disturbance: Secondary | ICD-10-CM | POA: Diagnosis not present

## 2020-02-22 DIAGNOSIS — G301 Alzheimer's disease with late onset: Secondary | ICD-10-CM | POA: Diagnosis not present

## 2020-02-22 DIAGNOSIS — R41841 Cognitive communication deficit: Secondary | ICD-10-CM | POA: Diagnosis not present

## 2020-02-22 DIAGNOSIS — F0281 Dementia in other diseases classified elsewhere with behavioral disturbance: Secondary | ICD-10-CM | POA: Diagnosis not present

## 2020-02-22 DIAGNOSIS — G40909 Epilepsy, unspecified, not intractable, without status epilepticus: Secondary | ICD-10-CM | POA: Diagnosis not present

## 2020-02-22 DIAGNOSIS — F331 Major depressive disorder, recurrent, moderate: Secondary | ICD-10-CM | POA: Diagnosis not present

## 2020-02-25 DIAGNOSIS — I10 Essential (primary) hypertension: Secondary | ICD-10-CM | POA: Diagnosis not present

## 2020-02-25 DIAGNOSIS — F32A Depression, unspecified: Secondary | ICD-10-CM | POA: Diagnosis not present

## 2020-02-25 DIAGNOSIS — Z86718 Personal history of other venous thrombosis and embolism: Secondary | ICD-10-CM | POA: Diagnosis not present

## 2020-02-25 DIAGNOSIS — F0390 Unspecified dementia without behavioral disturbance: Secondary | ICD-10-CM | POA: Diagnosis not present

## 2020-02-25 DIAGNOSIS — E876 Hypokalemia: Secondary | ICD-10-CM | POA: Diagnosis not present

## 2020-02-25 DIAGNOSIS — R569 Unspecified convulsions: Secondary | ICD-10-CM | POA: Diagnosis not present

## 2020-02-25 DIAGNOSIS — E039 Hypothyroidism, unspecified: Secondary | ICD-10-CM | POA: Diagnosis not present

## 2020-02-25 DIAGNOSIS — E569 Vitamin deficiency, unspecified: Secondary | ICD-10-CM | POA: Diagnosis not present

## 2020-02-29 DIAGNOSIS — D519 Vitamin B12 deficiency anemia, unspecified: Secondary | ICD-10-CM | POA: Diagnosis not present

## 2020-02-29 DIAGNOSIS — I10 Essential (primary) hypertension: Secondary | ICD-10-CM | POA: Diagnosis not present

## 2020-02-29 DIAGNOSIS — E559 Vitamin D deficiency, unspecified: Secondary | ICD-10-CM | POA: Diagnosis not present

## 2020-03-24 DIAGNOSIS — Z86718 Personal history of other venous thrombosis and embolism: Secondary | ICD-10-CM | POA: Diagnosis not present

## 2020-03-24 DIAGNOSIS — R569 Unspecified convulsions: Secondary | ICD-10-CM | POA: Diagnosis not present

## 2020-03-24 DIAGNOSIS — E039 Hypothyroidism, unspecified: Secondary | ICD-10-CM | POA: Diagnosis not present

## 2020-03-24 DIAGNOSIS — F32A Depression, unspecified: Secondary | ICD-10-CM | POA: Diagnosis not present

## 2020-03-24 DIAGNOSIS — M6281 Muscle weakness (generalized): Secondary | ICD-10-CM | POA: Diagnosis not present

## 2020-03-24 DIAGNOSIS — I6789 Other cerebrovascular disease: Secondary | ICD-10-CM | POA: Diagnosis not present

## 2020-03-24 DIAGNOSIS — I10 Essential (primary) hypertension: Secondary | ICD-10-CM | POA: Diagnosis not present

## 2020-03-24 DIAGNOSIS — F0390 Unspecified dementia without behavioral disturbance: Secondary | ICD-10-CM | POA: Diagnosis not present

## 2020-03-24 DIAGNOSIS — E569 Vitamin deficiency, unspecified: Secondary | ICD-10-CM | POA: Diagnosis not present

## 2020-03-30 DIAGNOSIS — R1312 Dysphagia, oropharyngeal phase: Secondary | ICD-10-CM | POA: Diagnosis not present

## 2020-03-30 DIAGNOSIS — R488 Other symbolic dysfunctions: Secondary | ICD-10-CM | POA: Diagnosis not present

## 2020-03-31 DIAGNOSIS — R488 Other symbolic dysfunctions: Secondary | ICD-10-CM | POA: Diagnosis not present

## 2020-03-31 DIAGNOSIS — R1312 Dysphagia, oropharyngeal phase: Secondary | ICD-10-CM | POA: Diagnosis not present

## 2020-04-03 DIAGNOSIS — R488 Other symbolic dysfunctions: Secondary | ICD-10-CM | POA: Diagnosis not present

## 2020-04-03 DIAGNOSIS — R1312 Dysphagia, oropharyngeal phase: Secondary | ICD-10-CM | POA: Diagnosis not present

## 2020-04-04 DIAGNOSIS — R488 Other symbolic dysfunctions: Secondary | ICD-10-CM | POA: Diagnosis not present

## 2020-04-04 DIAGNOSIS — R1312 Dysphagia, oropharyngeal phase: Secondary | ICD-10-CM | POA: Diagnosis not present

## 2020-04-05 DIAGNOSIS — R1312 Dysphagia, oropharyngeal phase: Secondary | ICD-10-CM | POA: Diagnosis not present

## 2020-04-05 DIAGNOSIS — R488 Other symbolic dysfunctions: Secondary | ICD-10-CM | POA: Diagnosis not present

## 2020-04-06 DIAGNOSIS — R488 Other symbolic dysfunctions: Secondary | ICD-10-CM | POA: Diagnosis not present

## 2020-04-06 DIAGNOSIS — R1312 Dysphagia, oropharyngeal phase: Secondary | ICD-10-CM | POA: Diagnosis not present

## 2020-04-07 DIAGNOSIS — R1312 Dysphagia, oropharyngeal phase: Secondary | ICD-10-CM | POA: Diagnosis not present

## 2020-04-07 DIAGNOSIS — R488 Other symbolic dysfunctions: Secondary | ICD-10-CM | POA: Diagnosis not present

## 2020-04-17 DIAGNOSIS — G301 Alzheimer's disease with late onset: Secondary | ICD-10-CM | POA: Diagnosis not present

## 2020-04-17 DIAGNOSIS — F0281 Dementia in other diseases classified elsewhere with behavioral disturbance: Secondary | ICD-10-CM | POA: Diagnosis not present

## 2020-04-17 DIAGNOSIS — G40909 Epilepsy, unspecified, not intractable, without status epilepticus: Secondary | ICD-10-CM | POA: Diagnosis not present

## 2020-04-17 DIAGNOSIS — R41841 Cognitive communication deficit: Secondary | ICD-10-CM | POA: Diagnosis not present

## 2020-04-17 DIAGNOSIS — F331 Major depressive disorder, recurrent, moderate: Secondary | ICD-10-CM | POA: Diagnosis not present

## 2020-05-16 DIAGNOSIS — G40909 Epilepsy, unspecified, not intractable, without status epilepticus: Secondary | ICD-10-CM | POA: Diagnosis not present

## 2020-05-16 DIAGNOSIS — G301 Alzheimer's disease with late onset: Secondary | ICD-10-CM | POA: Diagnosis not present

## 2020-05-16 DIAGNOSIS — F0281 Dementia in other diseases classified elsewhere with behavioral disturbance: Secondary | ICD-10-CM | POA: Diagnosis not present

## 2020-05-16 DIAGNOSIS — R41841 Cognitive communication deficit: Secondary | ICD-10-CM | POA: Diagnosis not present

## 2020-05-16 DIAGNOSIS — F331 Major depressive disorder, recurrent, moderate: Secondary | ICD-10-CM | POA: Diagnosis not present

## 2020-05-23 DIAGNOSIS — I739 Peripheral vascular disease, unspecified: Secondary | ICD-10-CM | POA: Diagnosis not present

## 2020-05-23 DIAGNOSIS — L602 Onychogryphosis: Secondary | ICD-10-CM | POA: Diagnosis not present

## 2020-05-25 DIAGNOSIS — F0390 Unspecified dementia without behavioral disturbance: Secondary | ICD-10-CM | POA: Diagnosis not present

## 2020-05-25 DIAGNOSIS — R569 Unspecified convulsions: Secondary | ICD-10-CM | POA: Diagnosis not present

## 2020-05-25 DIAGNOSIS — M6281 Muscle weakness (generalized): Secondary | ICD-10-CM | POA: Diagnosis not present

## 2020-05-25 DIAGNOSIS — S41111A Laceration without foreign body of right upper arm, initial encounter: Secondary | ICD-10-CM | POA: Diagnosis not present

## 2020-05-25 DIAGNOSIS — Z86718 Personal history of other venous thrombosis and embolism: Secondary | ICD-10-CM | POA: Diagnosis not present

## 2020-05-25 DIAGNOSIS — W19XXXA Unspecified fall, initial encounter: Secondary | ICD-10-CM | POA: Diagnosis not present

## 2020-06-06 DIAGNOSIS — E039 Hypothyroidism, unspecified: Secondary | ICD-10-CM | POA: Diagnosis not present

## 2020-06-06 DIAGNOSIS — F0390 Unspecified dementia without behavioral disturbance: Secondary | ICD-10-CM | POA: Diagnosis not present

## 2020-06-06 DIAGNOSIS — I10 Essential (primary) hypertension: Secondary | ICD-10-CM | POA: Diagnosis not present

## 2020-06-06 DIAGNOSIS — R569 Unspecified convulsions: Secondary | ICD-10-CM | POA: Diagnosis not present

## 2020-06-06 DIAGNOSIS — F32A Depression, unspecified: Secondary | ICD-10-CM | POA: Diagnosis not present

## 2020-06-06 DIAGNOSIS — Z86718 Personal history of other venous thrombosis and embolism: Secondary | ICD-10-CM | POA: Diagnosis not present

## 2020-06-06 DIAGNOSIS — R197 Diarrhea, unspecified: Secondary | ICD-10-CM | POA: Diagnosis not present

## 2020-06-06 DIAGNOSIS — I6789 Other cerebrovascular disease: Secondary | ICD-10-CM | POA: Diagnosis not present

## 2020-06-06 DIAGNOSIS — M6281 Muscle weakness (generalized): Secondary | ICD-10-CM | POA: Diagnosis not present

## 2020-06-07 DIAGNOSIS — D519 Vitamin B12 deficiency anemia, unspecified: Secondary | ICD-10-CM | POA: Diagnosis not present

## 2020-06-07 DIAGNOSIS — E039 Hypothyroidism, unspecified: Secondary | ICD-10-CM | POA: Diagnosis not present

## 2020-06-07 DIAGNOSIS — Z79899 Other long term (current) drug therapy: Secondary | ICD-10-CM | POA: Diagnosis not present

## 2020-06-07 DIAGNOSIS — I10 Essential (primary) hypertension: Secondary | ICD-10-CM | POA: Diagnosis not present

## 2020-06-07 DIAGNOSIS — E559 Vitamin D deficiency, unspecified: Secondary | ICD-10-CM | POA: Diagnosis not present

## 2020-06-08 DIAGNOSIS — F0390 Unspecified dementia without behavioral disturbance: Secondary | ICD-10-CM | POA: Diagnosis not present

## 2020-06-08 DIAGNOSIS — E559 Vitamin D deficiency, unspecified: Secondary | ICD-10-CM | POA: Diagnosis not present

## 2020-06-08 DIAGNOSIS — Z86718 Personal history of other venous thrombosis and embolism: Secondary | ICD-10-CM | POA: Diagnosis not present

## 2020-06-08 DIAGNOSIS — R197 Diarrhea, unspecified: Secondary | ICD-10-CM | POA: Diagnosis not present

## 2020-06-08 DIAGNOSIS — M6281 Muscle weakness (generalized): Secondary | ICD-10-CM | POA: Diagnosis not present

## 2020-06-08 DIAGNOSIS — E039 Hypothyroidism, unspecified: Secondary | ICD-10-CM | POA: Diagnosis not present

## 2020-06-16 DIAGNOSIS — R262 Difficulty in walking, not elsewhere classified: Secondary | ICD-10-CM | POA: Diagnosis not present

## 2020-06-16 DIAGNOSIS — Z86711 Personal history of pulmonary embolism: Secondary | ICD-10-CM | POA: Diagnosis not present

## 2020-06-16 DIAGNOSIS — E039 Hypothyroidism, unspecified: Secondary | ICD-10-CM | POA: Diagnosis not present

## 2020-06-16 DIAGNOSIS — R569 Unspecified convulsions: Secondary | ICD-10-CM | POA: Diagnosis not present

## 2020-06-16 DIAGNOSIS — S72002A Fracture of unspecified part of neck of left femur, initial encounter for closed fracture: Secondary | ICD-10-CM | POA: Diagnosis not present

## 2020-06-16 DIAGNOSIS — M6281 Muscle weakness (generalized): Secondary | ICD-10-CM | POA: Diagnosis not present

## 2020-06-16 DIAGNOSIS — K219 Gastro-esophageal reflux disease without esophagitis: Secondary | ICD-10-CM | POA: Diagnosis not present

## 2020-06-16 DIAGNOSIS — I10 Essential (primary) hypertension: Secondary | ICD-10-CM | POA: Diagnosis not present

## 2020-06-16 DIAGNOSIS — F0390 Unspecified dementia without behavioral disturbance: Secondary | ICD-10-CM | POA: Diagnosis not present

## 2020-06-21 DIAGNOSIS — I69321 Dysphasia following cerebral infarction: Secondary | ICD-10-CM | POA: Diagnosis not present

## 2020-06-21 DIAGNOSIS — R41841 Cognitive communication deficit: Secondary | ICD-10-CM | POA: Diagnosis not present

## 2020-06-21 DIAGNOSIS — G40909 Epilepsy, unspecified, not intractable, without status epilepticus: Secondary | ICD-10-CM | POA: Diagnosis not present

## 2020-06-21 DIAGNOSIS — F331 Major depressive disorder, recurrent, moderate: Secondary | ICD-10-CM | POA: Diagnosis not present

## 2020-06-22 DIAGNOSIS — Z86711 Personal history of pulmonary embolism: Secondary | ICD-10-CM | POA: Diagnosis not present

## 2020-06-22 DIAGNOSIS — E039 Hypothyroidism, unspecified: Secondary | ICD-10-CM | POA: Diagnosis not present

## 2020-06-22 DIAGNOSIS — K219 Gastro-esophageal reflux disease without esophagitis: Secondary | ICD-10-CM | POA: Diagnosis not present

## 2020-06-22 DIAGNOSIS — R569 Unspecified convulsions: Secondary | ICD-10-CM | POA: Diagnosis not present

## 2020-06-22 DIAGNOSIS — R262 Difficulty in walking, not elsewhere classified: Secondary | ICD-10-CM | POA: Diagnosis not present

## 2020-06-22 DIAGNOSIS — F0390 Unspecified dementia without behavioral disturbance: Secondary | ICD-10-CM | POA: Diagnosis not present

## 2020-06-22 DIAGNOSIS — S72002A Fracture of unspecified part of neck of left femur, initial encounter for closed fracture: Secondary | ICD-10-CM | POA: Diagnosis not present

## 2020-06-22 DIAGNOSIS — M6281 Muscle weakness (generalized): Secondary | ICD-10-CM | POA: Diagnosis not present

## 2020-06-22 DIAGNOSIS — I10 Essential (primary) hypertension: Secondary | ICD-10-CM | POA: Diagnosis not present

## 2020-06-25 DIAGNOSIS — K219 Gastro-esophageal reflux disease without esophagitis: Secondary | ICD-10-CM | POA: Diagnosis not present

## 2020-06-25 DIAGNOSIS — E039 Hypothyroidism, unspecified: Secondary | ICD-10-CM | POA: Diagnosis not present

## 2020-06-25 DIAGNOSIS — S72002A Fracture of unspecified part of neck of left femur, initial encounter for closed fracture: Secondary | ICD-10-CM | POA: Diagnosis not present

## 2020-06-25 DIAGNOSIS — M6281 Muscle weakness (generalized): Secondary | ICD-10-CM | POA: Diagnosis not present

## 2020-06-25 DIAGNOSIS — R569 Unspecified convulsions: Secondary | ICD-10-CM | POA: Diagnosis not present

## 2020-06-25 DIAGNOSIS — Z86711 Personal history of pulmonary embolism: Secondary | ICD-10-CM | POA: Diagnosis not present

## 2020-06-25 DIAGNOSIS — R262 Difficulty in walking, not elsewhere classified: Secondary | ICD-10-CM | POA: Diagnosis not present

## 2020-06-25 DIAGNOSIS — I10 Essential (primary) hypertension: Secondary | ICD-10-CM | POA: Diagnosis not present

## 2020-06-25 DIAGNOSIS — F0390 Unspecified dementia without behavioral disturbance: Secondary | ICD-10-CM | POA: Diagnosis not present

## 2020-06-26 DIAGNOSIS — Z86711 Personal history of pulmonary embolism: Secondary | ICD-10-CM | POA: Diagnosis not present

## 2020-06-26 DIAGNOSIS — R262 Difficulty in walking, not elsewhere classified: Secondary | ICD-10-CM | POA: Diagnosis not present

## 2020-06-26 DIAGNOSIS — K219 Gastro-esophageal reflux disease without esophagitis: Secondary | ICD-10-CM | POA: Diagnosis not present

## 2020-06-26 DIAGNOSIS — E039 Hypothyroidism, unspecified: Secondary | ICD-10-CM | POA: Diagnosis not present

## 2020-06-26 DIAGNOSIS — M6281 Muscle weakness (generalized): Secondary | ICD-10-CM | POA: Diagnosis not present

## 2020-06-26 DIAGNOSIS — R569 Unspecified convulsions: Secondary | ICD-10-CM | POA: Diagnosis not present

## 2020-06-26 DIAGNOSIS — S72002A Fracture of unspecified part of neck of left femur, initial encounter for closed fracture: Secondary | ICD-10-CM | POA: Diagnosis not present

## 2020-06-26 DIAGNOSIS — F0390 Unspecified dementia without behavioral disturbance: Secondary | ICD-10-CM | POA: Diagnosis not present

## 2020-06-26 DIAGNOSIS — I10 Essential (primary) hypertension: Secondary | ICD-10-CM | POA: Diagnosis not present

## 2020-06-27 DIAGNOSIS — R262 Difficulty in walking, not elsewhere classified: Secondary | ICD-10-CM | POA: Diagnosis not present

## 2020-06-27 DIAGNOSIS — F0390 Unspecified dementia without behavioral disturbance: Secondary | ICD-10-CM | POA: Diagnosis not present

## 2020-06-27 DIAGNOSIS — Z86711 Personal history of pulmonary embolism: Secondary | ICD-10-CM | POA: Diagnosis not present

## 2020-06-27 DIAGNOSIS — I10 Essential (primary) hypertension: Secondary | ICD-10-CM | POA: Diagnosis not present

## 2020-06-27 DIAGNOSIS — K219 Gastro-esophageal reflux disease without esophagitis: Secondary | ICD-10-CM | POA: Diagnosis not present

## 2020-06-27 DIAGNOSIS — R569 Unspecified convulsions: Secondary | ICD-10-CM | POA: Diagnosis not present

## 2020-06-27 DIAGNOSIS — M6281 Muscle weakness (generalized): Secondary | ICD-10-CM | POA: Diagnosis not present

## 2020-06-27 DIAGNOSIS — E039 Hypothyroidism, unspecified: Secondary | ICD-10-CM | POA: Diagnosis not present

## 2020-06-27 DIAGNOSIS — S72002A Fracture of unspecified part of neck of left femur, initial encounter for closed fracture: Secondary | ICD-10-CM | POA: Diagnosis not present

## 2020-06-28 DIAGNOSIS — R262 Difficulty in walking, not elsewhere classified: Secondary | ICD-10-CM | POA: Diagnosis not present

## 2020-06-28 DIAGNOSIS — K219 Gastro-esophageal reflux disease without esophagitis: Secondary | ICD-10-CM | POA: Diagnosis not present

## 2020-06-28 DIAGNOSIS — F0390 Unspecified dementia without behavioral disturbance: Secondary | ICD-10-CM | POA: Diagnosis not present

## 2020-06-28 DIAGNOSIS — R569 Unspecified convulsions: Secondary | ICD-10-CM | POA: Diagnosis not present

## 2020-06-28 DIAGNOSIS — E039 Hypothyroidism, unspecified: Secondary | ICD-10-CM | POA: Diagnosis not present

## 2020-06-28 DIAGNOSIS — S72002A Fracture of unspecified part of neck of left femur, initial encounter for closed fracture: Secondary | ICD-10-CM | POA: Diagnosis not present

## 2020-06-28 DIAGNOSIS — M6281 Muscle weakness (generalized): Secondary | ICD-10-CM | POA: Diagnosis not present

## 2020-06-28 DIAGNOSIS — Z86711 Personal history of pulmonary embolism: Secondary | ICD-10-CM | POA: Diagnosis not present

## 2020-06-28 DIAGNOSIS — I10 Essential (primary) hypertension: Secondary | ICD-10-CM | POA: Diagnosis not present

## 2020-06-29 DIAGNOSIS — R569 Unspecified convulsions: Secondary | ICD-10-CM | POA: Diagnosis not present

## 2020-06-29 DIAGNOSIS — R262 Difficulty in walking, not elsewhere classified: Secondary | ICD-10-CM | POA: Diagnosis not present

## 2020-06-29 DIAGNOSIS — S72002A Fracture of unspecified part of neck of left femur, initial encounter for closed fracture: Secondary | ICD-10-CM | POA: Diagnosis not present

## 2020-06-29 DIAGNOSIS — E039 Hypothyroidism, unspecified: Secondary | ICD-10-CM | POA: Diagnosis not present

## 2020-06-29 DIAGNOSIS — I10 Essential (primary) hypertension: Secondary | ICD-10-CM | POA: Diagnosis not present

## 2020-06-29 DIAGNOSIS — M6281 Muscle weakness (generalized): Secondary | ICD-10-CM | POA: Diagnosis not present

## 2020-06-29 DIAGNOSIS — Z86711 Personal history of pulmonary embolism: Secondary | ICD-10-CM | POA: Diagnosis not present

## 2020-06-29 DIAGNOSIS — K219 Gastro-esophageal reflux disease without esophagitis: Secondary | ICD-10-CM | POA: Diagnosis not present

## 2020-06-29 DIAGNOSIS — F0390 Unspecified dementia without behavioral disturbance: Secondary | ICD-10-CM | POA: Diagnosis not present

## 2020-06-30 DIAGNOSIS — K219 Gastro-esophageal reflux disease without esophagitis: Secondary | ICD-10-CM | POA: Diagnosis not present

## 2020-06-30 DIAGNOSIS — Z86711 Personal history of pulmonary embolism: Secondary | ICD-10-CM | POA: Diagnosis not present

## 2020-06-30 DIAGNOSIS — R262 Difficulty in walking, not elsewhere classified: Secondary | ICD-10-CM | POA: Diagnosis not present

## 2020-06-30 DIAGNOSIS — M6281 Muscle weakness (generalized): Secondary | ICD-10-CM | POA: Diagnosis not present

## 2020-06-30 DIAGNOSIS — R569 Unspecified convulsions: Secondary | ICD-10-CM | POA: Diagnosis not present

## 2020-06-30 DIAGNOSIS — E039 Hypothyroidism, unspecified: Secondary | ICD-10-CM | POA: Diagnosis not present

## 2020-06-30 DIAGNOSIS — S72002A Fracture of unspecified part of neck of left femur, initial encounter for closed fracture: Secondary | ICD-10-CM | POA: Diagnosis not present

## 2020-06-30 DIAGNOSIS — I10 Essential (primary) hypertension: Secondary | ICD-10-CM | POA: Diagnosis not present

## 2020-06-30 DIAGNOSIS — F0390 Unspecified dementia without behavioral disturbance: Secondary | ICD-10-CM | POA: Diagnosis not present

## 2020-07-03 DIAGNOSIS — E039 Hypothyroidism, unspecified: Secondary | ICD-10-CM | POA: Diagnosis not present

## 2020-07-03 DIAGNOSIS — K219 Gastro-esophageal reflux disease without esophagitis: Secondary | ICD-10-CM | POA: Diagnosis not present

## 2020-07-03 DIAGNOSIS — S72002A Fracture of unspecified part of neck of left femur, initial encounter for closed fracture: Secondary | ICD-10-CM | POA: Diagnosis not present

## 2020-07-03 DIAGNOSIS — R569 Unspecified convulsions: Secondary | ICD-10-CM | POA: Diagnosis not present

## 2020-07-03 DIAGNOSIS — F0390 Unspecified dementia without behavioral disturbance: Secondary | ICD-10-CM | POA: Diagnosis not present

## 2020-07-03 DIAGNOSIS — Z86711 Personal history of pulmonary embolism: Secondary | ICD-10-CM | POA: Diagnosis not present

## 2020-07-03 DIAGNOSIS — I10 Essential (primary) hypertension: Secondary | ICD-10-CM | POA: Diagnosis not present

## 2020-07-03 DIAGNOSIS — M6281 Muscle weakness (generalized): Secondary | ICD-10-CM | POA: Diagnosis not present

## 2020-07-03 DIAGNOSIS — R262 Difficulty in walking, not elsewhere classified: Secondary | ICD-10-CM | POA: Diagnosis not present

## 2020-07-04 DIAGNOSIS — Z86711 Personal history of pulmonary embolism: Secondary | ICD-10-CM | POA: Diagnosis not present

## 2020-07-04 DIAGNOSIS — S72002A Fracture of unspecified part of neck of left femur, initial encounter for closed fracture: Secondary | ICD-10-CM | POA: Diagnosis not present

## 2020-07-04 DIAGNOSIS — F0390 Unspecified dementia without behavioral disturbance: Secondary | ICD-10-CM | POA: Diagnosis not present

## 2020-07-04 DIAGNOSIS — R569 Unspecified convulsions: Secondary | ICD-10-CM | POA: Diagnosis not present

## 2020-07-04 DIAGNOSIS — I10 Essential (primary) hypertension: Secondary | ICD-10-CM | POA: Diagnosis not present

## 2020-07-04 DIAGNOSIS — R262 Difficulty in walking, not elsewhere classified: Secondary | ICD-10-CM | POA: Diagnosis not present

## 2020-07-04 DIAGNOSIS — K219 Gastro-esophageal reflux disease without esophagitis: Secondary | ICD-10-CM | POA: Diagnosis not present

## 2020-07-04 DIAGNOSIS — E039 Hypothyroidism, unspecified: Secondary | ICD-10-CM | POA: Diagnosis not present

## 2020-07-04 DIAGNOSIS — M6281 Muscle weakness (generalized): Secondary | ICD-10-CM | POA: Diagnosis not present

## 2020-07-14 DIAGNOSIS — I10 Essential (primary) hypertension: Secondary | ICD-10-CM | POA: Diagnosis not present

## 2020-07-14 DIAGNOSIS — I480 Paroxysmal atrial fibrillation: Secondary | ICD-10-CM | POA: Diagnosis not present

## 2020-07-14 DIAGNOSIS — I2699 Other pulmonary embolism without acute cor pulmonale: Secondary | ICD-10-CM | POA: Diagnosis not present

## 2020-07-14 DIAGNOSIS — I82412 Acute embolism and thrombosis of left femoral vein: Secondary | ICD-10-CM | POA: Diagnosis not present

## 2020-07-21 DIAGNOSIS — K047 Periapical abscess without sinus: Secondary | ICD-10-CM | POA: Diagnosis not present

## 2020-07-21 DIAGNOSIS — M6281 Muscle weakness (generalized): Secondary | ICD-10-CM | POA: Diagnosis not present

## 2020-07-26 DIAGNOSIS — E039 Hypothyroidism, unspecified: Secondary | ICD-10-CM | POA: Diagnosis not present

## 2020-07-26 DIAGNOSIS — F32A Depression, unspecified: Secondary | ICD-10-CM | POA: Diagnosis not present

## 2020-07-26 DIAGNOSIS — Z86711 Personal history of pulmonary embolism: Secondary | ICD-10-CM | POA: Diagnosis not present

## 2020-07-26 DIAGNOSIS — I639 Cerebral infarction, unspecified: Secondary | ICD-10-CM | POA: Diagnosis not present

## 2020-07-26 DIAGNOSIS — G4089 Other seizures: Secondary | ICD-10-CM | POA: Diagnosis not present

## 2020-08-22 ENCOUNTER — Encounter: Payer: Self-pay | Admitting: Emergency Medicine

## 2020-08-22 ENCOUNTER — Other Ambulatory Visit: Payer: Self-pay

## 2020-08-22 ENCOUNTER — Emergency Department
Admission: EM | Admit: 2020-08-22 | Discharge: 2020-08-22 | Disposition: A | Discharge status: Home / Self Care | Payer: Medicare HMO | Attending: Emergency Medicine | Admitting: Emergency Medicine

## 2020-08-22 ENCOUNTER — Emergency Department: Payer: Medicare HMO

## 2020-08-22 DIAGNOSIS — N390 Urinary tract infection, site not specified: Secondary | ICD-10-CM | POA: Diagnosis not present

## 2020-08-22 DIAGNOSIS — S0003XA Contusion of scalp, initial encounter: Secondary | ICD-10-CM | POA: Diagnosis not present

## 2020-08-22 DIAGNOSIS — S199XXA Unspecified injury of neck, initial encounter: Secondary | ICD-10-CM | POA: Diagnosis not present

## 2020-08-22 DIAGNOSIS — Z86711 Personal history of pulmonary embolism: Secondary | ICD-10-CM | POA: Diagnosis not present

## 2020-08-22 DIAGNOSIS — W1830XA Fall on same level, unspecified, initial encounter: Secondary | ICD-10-CM | POA: Insufficient documentation

## 2020-08-22 DIAGNOSIS — S0181XA Laceration without foreign body of other part of head, initial encounter: Secondary | ICD-10-CM

## 2020-08-22 DIAGNOSIS — R4182 Altered mental status, unspecified: Secondary | ICD-10-CM | POA: Diagnosis not present

## 2020-08-22 DIAGNOSIS — R279 Unspecified lack of coordination: Secondary | ICD-10-CM | POA: Diagnosis not present

## 2020-08-22 DIAGNOSIS — R5381 Other malaise: Secondary | ICD-10-CM | POA: Diagnosis not present

## 2020-08-22 DIAGNOSIS — S0990XA Unspecified injury of head, initial encounter: Secondary | ICD-10-CM | POA: Diagnosis not present

## 2020-08-22 DIAGNOSIS — Z7901 Long term (current) use of anticoagulants: Secondary | ICD-10-CM | POA: Diagnosis not present

## 2020-08-22 DIAGNOSIS — G4089 Other seizures: Secondary | ICD-10-CM | POA: Diagnosis not present

## 2020-08-22 DIAGNOSIS — W19XXXA Unspecified fall, initial encounter: Secondary | ICD-10-CM

## 2020-08-22 DIAGNOSIS — Z743 Need for continuous supervision: Secondary | ICD-10-CM | POA: Diagnosis not present

## 2020-08-22 DIAGNOSIS — R404 Transient alteration of awareness: Secondary | ICD-10-CM | POA: Diagnosis not present

## 2020-08-22 LAB — URINALYSIS, COMPLETE (UACMP) WITH MICROSCOPIC
Bilirubin Urine: NEGATIVE
Glucose, UA: NEGATIVE mg/dL
Ketones, ur: NEGATIVE mg/dL
Nitrite: POSITIVE — AB
Protein, ur: NEGATIVE mg/dL
Specific Gravity, Urine: 1.016 (ref 1.005–1.030)
WBC, UA: >50 WBC/hpf — ABNORMAL HIGH (ref 0–5)
pH: 6 (ref 5.0–8.0)

## 2020-08-22 LAB — CBC WITH DIFFERENTIAL/PLATELET
Abs Immature Granulocytes: 0.05 10*3/uL (ref 0.00–0.07)
Basophils Absolute: 0.1 10*3/uL (ref 0.0–0.1)
Basophils Relative: 1 %
Eosinophils Absolute: 0.4 10*3/uL (ref 0.0–0.5)
Eosinophils Relative: 6 %
HCT: 46.4 % — ABNORMAL HIGH (ref 36.0–46.0)
Hemoglobin: 15.3 g/dL — ABNORMAL HIGH (ref 12.0–15.0)
Immature Granulocytes: 1 %
Lymphocytes Relative: 28 %
Lymphs Abs: 2 10*3/uL (ref 0.7–4.0)
MCH: 29.9 pg (ref 26.0–34.0)
MCHC: 33 g/dL (ref 30.0–36.0)
MCV: 90.8 fL (ref 80.0–100.0)
Monocytes Absolute: 0.7 10*3/uL (ref 0.1–1.0)
Monocytes Relative: 9 %
Neutro Abs: 4.1 10*3/uL (ref 1.7–7.7)
Neutrophils Relative %: 55 %
Platelets: 243 10*3/uL (ref 150–400)
RBC: 5.11 MIL/uL (ref 3.87–5.11)
RDW: 13.5 % (ref 11.5–15.5)
WBC: 7.3 10*3/uL (ref 4.0–10.5)
nRBC: 0 % (ref 0.0–0.2)

## 2020-08-22 LAB — COMPREHENSIVE METABOLIC PANEL
ALT: 29 U/L (ref 0–44)
AST: 41 U/L (ref 15–41)
Albumin: 3.4 g/dL — ABNORMAL LOW (ref 3.5–5.0)
Alkaline Phosphatase: 122 U/L (ref 38–126)
Anion gap: 6 (ref 5–15)
BUN: 11 mg/dL (ref 8–23)
CO2: 26 mmol/L (ref 22–32)
Calcium: 8.3 mg/dL — ABNORMAL LOW (ref 8.9–10.3)
Chloride: 106 mmol/L (ref 98–111)
Creatinine, Ser: 0.74 mg/dL (ref 0.44–1.00)
GFR, Estimated: >60 mL/min (ref >60)
Glucose, Bld: 111 mg/dL — ABNORMAL HIGH (ref 70–99)
Potassium: 4.1 mmol/L (ref 3.5–5.1)
Sodium: 138 mmol/L (ref 135–145)
Total Bilirubin: 0.6 mg/dL (ref 0.3–1.2)
Total Protein: 6.8 g/dL (ref 6.5–8.1)

## 2020-08-22 MED ORDER — CEFDINIR 300 MG PO CAPS
300.0000 mg | ORAL_CAPSULE | Freq: Two times a day (BID) | ORAL | Status: DC
  Administered 2020-08-22: 300 mg via ORAL
  Filled 2020-08-22: qty 1

## 2020-08-22 MED ORDER — CEFDINIR 300 MG PO CAPS: 300.0000 mg | ORAL_CAPSULE | Freq: Two times a day (BID) | ORAL | 0 refills | Status: AC

## 2020-08-22 NOTE — ED Provider Notes (Signed)
[facility] Emergency Department Provider Note  ____________________________________________   Event Date/Time   First MD Initiated Contact with Patient 08/22/20 1319     (approximate)  I have reviewed the triage vital signs and the nursing notes.   HISTORY  Chief Complaint Fall    HPI Brittany Hogan is a 85 y.o. female presents emergency department from [facility] with an unwitnessed fall.  Patient has a large lump on her head with a small laceration overlying the lump.  Unable to get any more information other thanwhat [facility] states.  Past Medical History:  Diagnosis Date   Seizures (HCC)    Stroke [facility])     Patient Active Problem List   Diagnosis Date Noted   Respiratory failure (HCC) 07/15/2017   Hip fracture, unspecified laterality, closed, initial encounter (HCC) 07/07/2017   Hip fracture (HCC) 07/07/2017   Lower abdominal pain 10/29/2016   Vaginal lesion 10/29/2016    Past Surgical History:  Procedure Laterality Date   APPENDECTOMY     BREAST BIOPSY Right    neg   CHOLECYSTECTOMY N/A 09/27/2014   Procedure: LAPAROSCOPIC CHOLECYSTECTOMY with cholangiogram;  Surgeon: Kieth Brightly, MD;  Location: [facility] ORS;  Service: General;  Laterality: N/A;   INTRAMEDULLARY (IM) NAIL INTERTROCHANTERIC Left 07/08/2017   Procedure: INTRAMEDULLARY (IM) NAIL INTERTROCHANTRIC;  Surgeon: Juanell Fairly, MD;  Location: [facility] ORS;  Service: Orthopedics;  Laterality: Left;   SHOULDER SURGERY Right     Prior to Admission medications   Medication Sig Start Date End Date Taking? Authorizing Provider  cefdinir (OMNICEF) 300 MG capsule Take 1 capsule (300 mg total) by mouth 2 (two) times daily. 08/22/20  Yes Mussa Groesbeck, Roselyn Bering, PA-C  citalopram (CELEXA) 20 MG tablet Take 20 mg by mouth daily. 06/30/17   [provider]  hydrochlorothiazide (HYDRODIURIL) 25 MG tablet Take 25 mg by mouth daily.  10/14/16   [provider]   HYDROcodone-acetaminophen (NORCO/VICODIN) 5-325 MG tablet Take 1-2 tablets by mouth every 4 (four) hours as needed for moderate pain. Patient not taking: Reported on 07/15/2017 07/10/17   Katha Hamming, MD  levothyroxine (SYNTHROID, LEVOTHROID) 25 MCG tablet Take 25 mcg by mouth daily. 06/16/17   [provider]  pantoprazole (PROTONIX) 40 MG tablet Take 40 mg by mouth daily.  10/21/16   [provider]  PHENobarbital (LUMINAL) 64.8 MG tablet Take 1 tablet (64.8 mg total) by mouth 2 (two) times daily. 07/23/17   Milagros Loll, MD  phenytoin (DILANTIN) 100 MG ER capsule Take 100 mg by mouth 3 (three) times daily.    [provider]  polyethylene glycol (MIRALAX) packet Take 17 g by mouth daily as needed for mild constipation or moderate constipation. Patient taking differently: Take 17 g by mouth daily.  01/30/15   Myrna Blazer, MD  polyethylene glycol-electrolytes (TRILYTE) 420 G solution Take 4,000 mLs by mouth once. Patient not taking: Reported on 07/08/2017 02/01/15   Emily Filbert, MD  warfarin (COUMADIN) 2.5 MG tablet Take 1 tablet (2.5 mg total) by mouth daily at 6 PM. 07/21/17   Milagros Loll, MD    Allergies Patient has no known allergies.  History reviewed. No pertinent family history.  Social History Social History   Tobacco Use   Smoking status: Never   Smokeless tobacco: Never  Vaping Use   Vaping Use: Never used  Substance Use Topics   Alcohol use: No    Alcohol/week: 0.0 standard drinks   Drug use: No  Review of Systems Poor historian, patient is nonverbal at baseline  Constitutional: No fever/chills Eyes: No visual changes. ENT: No sore throat. Respiratory: Denies cough Cardiovascular: Denies chest pain Gastrointestinal: Denies abdominal pain Genitourinary: Negative for dysuria. Musculoskeletal: Negative for back pain. Skin: Negative for rash. Psychiatric: no mood changes,      ____________________________________________   PHYSICAL EXAM:  VITAL SIGNS: ED Triage Vitals [08/22/20 1337]  Enc Vitals Group     BP 139/68     Pulse Rate 70     Resp 20     Temp 98.2 F (36.8 C)     Temp Source Oral     SpO2 100 %     Weight 155 lb (70.3 kg)     Height 5\' 2"  (1.575 m)     Head Circumference      [facility]      Pain Score      Pain Loc      Pain Edu?      Excl. in GC?     Constitutional: Alert and oriented. Well appearing and in no acute distress. Eyes: Conjunctivae are normal.  Head: Large hematoma noted to the right side of the forehead Nose: No congestion/rhinnorhea. Mouth/Throat: Mucous membranes are moist.   Neck:  supple no lymphadenopathy noted Cardiovascular: Normal rate, regular rhythm. Heart sounds are normal Respiratory: Normal respiratory effort.  No retractions, lungs c t a  Abd: soft nontender bs normal all 4 quad GU: deferred Musculoskeletal: FROM all extremities, warm and well perfused Neurologic: Unable to assess since patient is nonverbal  skin:  Skin is warm, dry and intact. No rash noted. Psychiatric: Mood and affect are normal. Speech and behavior are normal.  ____________________________________________   LABS (all labs ordered are listed, but only abnormal results are displayed)  Labs Reviewed  CBC WITH DIFFERENTIAL/PLATELET - Abnormal; Notable for the following components:      Result Value   Hemoglobin 15.3 (*)    HCT 46.4 (*)    All other components within normal limits  URINALYSIS, COMPLETE (UACMP) WITH MICROSCOPIC - Abnormal; Notable for the following components:   Color, Urine YELLOW (*)    APPearance CLOUDY (*)    Hgb urine dipstick SMALL (*)    Nitrite POSITIVE (*)    Leukocytes,Ua LARGE (*)    WBC, UA >50 (*)    Bacteria, UA MANY (*)    All other components within normal limits  COMPREHENSIVE METABOLIC PANEL - Abnormal; Notable for the following components:   Glucose, Bld 111 (*)    Calcium 8.3 (*)     Albumin 3.4 (*)    All other components within normal limits  URINE CULTURE   ____________________________________________   ____________________________________________  RADIOLOGY  CT of the head and C-spine  ____________________________________________   PROCEDURES  Procedure(s) performed:   Marland KitchenLaceration Repair  Date/Time: 08/22/2020 5:11 PM Performed by: 10/23/2020, PA-C Authorized by: Faythe Ghee, PA-C   Consent:    Consent obtained:  Verbal   Consent given by:  Patient   Risks discussed:  Infection, pain, poor wound healing and poor cosmetic result   Alternatives discussed:  No treatment Universal protocol:    Procedure explained and questions answered to patient or proxy's satisfaction: yes     Patient identity confirmed:  Arm band Anesthesia:    Anesthesia method:  None Laceration details:    Location:  Face   Face location:  Forehead   Length (cm):  2 Exploration:    Hemostasis achieved  with:  Direct pressure   Wound exploration: wound explored through full range of motion     Wound extent: no foreign bodies/material noted, no muscle damage noted, no underlying fracture noted and no vascular damage noted     Contaminated: no   Treatment:    Area cleansed with:  Saline   Amount of cleaning:  Standard   Irrigation solution:  Sterile saline   Debridement:  None   Undermining:  None   Scar revision: no   Skin repair:    Repair method:  Tissue adhesive Approximation:    Approximation:  Close Repair type:    Repair type:  Simple Post-procedure details:    Dressing:  Open (no dressing)    ____________________________________________   INITIAL IMPRESSION / ASSESSMENT AND PLAN / ED COURSE  Pertinent labs & imaging results that were available during my care of the patient were reviewed by me and considered in my medical decision making (see chart for details).   Patient is an 85 year old female with history of CVA and is nonverbal,  presents from [facility] with a head injury.  See HPI  Physical exam shows patient to appear stable.  DDx: Scalp contusion, skull fracture, SAH, subdural hematoma, sepsis, UTI  CT of the head and C-spine reviewed by me confirmed by radiology to have no acute abnormalities other than the forehead hematoma.  Labs are reassuring except for her urine, CBC and metabolic panel are normal, urinalysis shows positive nitrites, large amount of leuks, greater than 50 WBCs with many bacteria noted.  Patient has had 2 UTIs in the past.  Son is concerned and asking why she keeps getting them.  Tried to explain to him that with her being in diapers and bedridden but this is normal.  She was given Omnicef here in the ED.  Urine culture was ordered.  Prescription for Omnicef at discharge.     Brittany Hogan was evaluated in Emergency Department on 08/22/2020 for the symptoms described in the history of present illness. She was evaluated in the context of the global COVID-19 pandemic, which necessitated consideration that the patient might be at risk for infection with the SARS-CoV-2 virus that causes COVID-19. Institutional protocols and algorithms that pertain to the evaluation of patients at risk for COVID-19 are in a state of rapid change based on information released by regulatory bodies including the CDC and federal and state organizations. These policies and algorithms were followed during the patient's care in the ED.    As part of my medical decision making, I reviewed the following data within the electronic MEDICAL RECORD NUMBER History obtained from family, Nursing notes reviewed and incorporated, Labs reviewed , Old chart reviewed, Radiograph reviewed , Notes from prior ED visits, and [place] Controlled Substance Database  ____________________________________________   FINAL CLINICAL IMPRESSION(S) / ED DIAGNOSES  Final diagnoses:  Fall, initial encounter  Minor head injury, initial encounter  Lower  urinary tract infectious disease  Forehead laceration, initial encounter      NEW MEDICATIONS STARTED DURING THIS VISIT:  New Prescriptions   CEFDINIR (OMNICEF) 300 MG CAPSULE    Take 1 capsule (300 mg total) by mouth 2 (two) times daily.     Note:  This document was prepared using Dragon voice recognition software and may include unintentional dictation errors.    Faythe Ghee, PA-C 08/22/20 1713    Shaune Pollack, MD 08/24/20 [phone]

## 2020-08-22 NOTE — ED Notes (Signed)
This RN at bedside attempted to get IV access. This RN failed twice.

## 2020-08-22 NOTE — ED Notes (Signed)
Report given to 200 hall nurse at [facility] at this time.

## 2020-08-22 NOTE — ED Notes (Signed)
Pt transported to CT at this time.

## 2020-08-22 NOTE — ED Triage Notes (Signed)
Pt via EMS from [facility]. Pt had a unwitnessed fall this AM. Staff states they found her on the ground. Pt has a laceration the R elbow and hematoma the size of a golf on her forehead. Pt is non-verbal at baseline. Pt is alert but disoriented.

## 2020-08-22 NOTE — Discharge Instructions (Signed)
Clean the wound on her forehead daily Omnicef twice daily for 7 days We ordered a urine culture, if it resistant to omnicef someone will call you

## 2020-08-23 DIAGNOSIS — W19XXXA Unspecified fall, initial encounter: Secondary | ICD-10-CM | POA: Diagnosis not present

## 2020-08-23 DIAGNOSIS — F0390 Unspecified dementia without behavioral disturbance: Secondary | ICD-10-CM | POA: Diagnosis not present

## 2020-08-23 DIAGNOSIS — N390 Urinary tract infection, site not specified: Secondary | ICD-10-CM | POA: Diagnosis not present

## 2020-08-23 DIAGNOSIS — M6281 Muscle weakness (generalized): Secondary | ICD-10-CM | POA: Diagnosis not present

## 2020-08-24 DIAGNOSIS — E039 Hypothyroidism, unspecified: Secondary | ICD-10-CM | POA: Diagnosis not present

## 2020-08-24 DIAGNOSIS — K219 Gastro-esophageal reflux disease without esophagitis: Secondary | ICD-10-CM | POA: Diagnosis not present

## 2020-08-24 DIAGNOSIS — Z86711 Personal history of pulmonary embolism: Secondary | ICD-10-CM | POA: Diagnosis not present

## 2020-08-24 DIAGNOSIS — R569 Unspecified convulsions: Secondary | ICD-10-CM | POA: Diagnosis not present

## 2020-08-24 DIAGNOSIS — M6281 Muscle weakness (generalized): Secondary | ICD-10-CM | POA: Diagnosis not present

## 2020-08-24 DIAGNOSIS — I10 Essential (primary) hypertension: Secondary | ICD-10-CM | POA: Diagnosis not present

## 2020-08-24 DIAGNOSIS — R262 Difficulty in walking, not elsewhere classified: Secondary | ICD-10-CM | POA: Diagnosis not present

## 2020-08-24 DIAGNOSIS — F0390 Unspecified dementia without behavioral disturbance: Secondary | ICD-10-CM | POA: Diagnosis not present

## 2020-08-24 DIAGNOSIS — F3289 Other specified depressive episodes: Secondary | ICD-10-CM | POA: Diagnosis not present

## 2020-08-25 DIAGNOSIS — R262 Difficulty in walking, not elsewhere classified: Secondary | ICD-10-CM | POA: Diagnosis not present

## 2020-08-25 DIAGNOSIS — M6281 Muscle weakness (generalized): Secondary | ICD-10-CM | POA: Diagnosis not present

## 2020-08-25 DIAGNOSIS — I10 Essential (primary) hypertension: Secondary | ICD-10-CM | POA: Diagnosis not present

## 2020-08-25 DIAGNOSIS — F3289 Other specified depressive episodes: Secondary | ICD-10-CM | POA: Diagnosis not present

## 2020-08-25 DIAGNOSIS — R569 Unspecified convulsions: Secondary | ICD-10-CM | POA: Diagnosis not present

## 2020-08-25 DIAGNOSIS — Z86711 Personal history of pulmonary embolism: Secondary | ICD-10-CM | POA: Diagnosis not present

## 2020-08-25 DIAGNOSIS — K219 Gastro-esophageal reflux disease without esophagitis: Secondary | ICD-10-CM | POA: Diagnosis not present

## 2020-08-25 DIAGNOSIS — E039 Hypothyroidism, unspecified: Secondary | ICD-10-CM | POA: Diagnosis not present

## 2020-08-25 DIAGNOSIS — F0390 Unspecified dementia without behavioral disturbance: Secondary | ICD-10-CM | POA: Diagnosis not present

## 2020-08-25 LAB — URINE CULTURE: Culture: >=100000 — AB

## 2020-08-28 DIAGNOSIS — F3289 Other specified depressive episodes: Secondary | ICD-10-CM | POA: Diagnosis not present

## 2020-08-28 DIAGNOSIS — R569 Unspecified convulsions: Secondary | ICD-10-CM | POA: Diagnosis not present

## 2020-08-28 DIAGNOSIS — F0390 Unspecified dementia without behavioral disturbance: Secondary | ICD-10-CM | POA: Diagnosis not present

## 2020-08-28 DIAGNOSIS — R262 Difficulty in walking, not elsewhere classified: Secondary | ICD-10-CM | POA: Diagnosis not present

## 2020-08-28 DIAGNOSIS — M6281 Muscle weakness (generalized): Secondary | ICD-10-CM | POA: Diagnosis not present

## 2020-08-28 DIAGNOSIS — Z86711 Personal history of pulmonary embolism: Secondary | ICD-10-CM | POA: Diagnosis not present

## 2020-08-28 DIAGNOSIS — I10 Essential (primary) hypertension: Secondary | ICD-10-CM | POA: Diagnosis not present

## 2020-08-28 DIAGNOSIS — E039 Hypothyroidism, unspecified: Secondary | ICD-10-CM | POA: Diagnosis not present

## 2020-08-28 DIAGNOSIS — K219 Gastro-esophageal reflux disease without esophagitis: Secondary | ICD-10-CM | POA: Diagnosis not present

## 2020-08-29 DIAGNOSIS — E039 Hypothyroidism, unspecified: Secondary | ICD-10-CM | POA: Diagnosis not present

## 2020-08-29 DIAGNOSIS — R569 Unspecified convulsions: Secondary | ICD-10-CM | POA: Diagnosis not present

## 2020-08-29 DIAGNOSIS — K219 Gastro-esophageal reflux disease without esophagitis: Secondary | ICD-10-CM | POA: Diagnosis not present

## 2020-08-29 DIAGNOSIS — M6281 Muscle weakness (generalized): Secondary | ICD-10-CM | POA: Diagnosis not present

## 2020-08-29 DIAGNOSIS — F3289 Other specified depressive episodes: Secondary | ICD-10-CM | POA: Diagnosis not present

## 2020-08-29 DIAGNOSIS — R262 Difficulty in walking, not elsewhere classified: Secondary | ICD-10-CM | POA: Diagnosis not present

## 2020-08-29 DIAGNOSIS — F0390 Unspecified dementia without behavioral disturbance: Secondary | ICD-10-CM | POA: Diagnosis not present

## 2020-08-29 DIAGNOSIS — Z86711 Personal history of pulmonary embolism: Secondary | ICD-10-CM | POA: Diagnosis not present

## 2020-08-29 DIAGNOSIS — I10 Essential (primary) hypertension: Secondary | ICD-10-CM | POA: Diagnosis not present

## 2020-08-30 DIAGNOSIS — R41841 Cognitive communication deficit: Secondary | ICD-10-CM | POA: Diagnosis not present

## 2020-08-30 DIAGNOSIS — G301 Alzheimer's disease with late onset: Secondary | ICD-10-CM | POA: Diagnosis not present

## 2020-08-30 DIAGNOSIS — R569 Unspecified convulsions: Secondary | ICD-10-CM | POA: Diagnosis not present

## 2020-08-30 DIAGNOSIS — I69318 Other symptoms and signs involving cognitive functions following cerebral infarction: Secondary | ICD-10-CM | POA: Diagnosis not present

## 2020-08-30 DIAGNOSIS — F0151 Vascular dementia with behavioral disturbance: Secondary | ICD-10-CM | POA: Diagnosis not present

## 2020-08-30 DIAGNOSIS — F331 Major depressive disorder, recurrent, moderate: Secondary | ICD-10-CM | POA: Diagnosis not present

## 2020-08-30 DIAGNOSIS — M6281 Muscle weakness (generalized): Secondary | ICD-10-CM | POA: Diagnosis not present

## 2020-08-30 DIAGNOSIS — G4089 Other seizures: Secondary | ICD-10-CM | POA: Diagnosis not present

## 2020-08-30 DIAGNOSIS — R262 Difficulty in walking, not elsewhere classified: Secondary | ICD-10-CM | POA: Diagnosis not present

## 2020-08-30 DIAGNOSIS — K219 Gastro-esophageal reflux disease without esophagitis: Secondary | ICD-10-CM | POA: Diagnosis not present

## 2020-08-30 DIAGNOSIS — F3289 Other specified depressive episodes: Secondary | ICD-10-CM | POA: Diagnosis not present

## 2020-08-30 DIAGNOSIS — N390 Urinary tract infection, site not specified: Secondary | ICD-10-CM | POA: Diagnosis not present

## 2020-08-30 DIAGNOSIS — Z86711 Personal history of pulmonary embolism: Secondary | ICD-10-CM | POA: Diagnosis not present

## 2020-08-30 DIAGNOSIS — I10 Essential (primary) hypertension: Secondary | ICD-10-CM | POA: Diagnosis not present

## 2020-08-30 DIAGNOSIS — E039 Hypothyroidism, unspecified: Secondary | ICD-10-CM | POA: Diagnosis not present

## 2020-08-30 DIAGNOSIS — F0390 Unspecified dementia without behavioral disturbance: Secondary | ICD-10-CM | POA: Diagnosis not present

## 2020-08-31 DIAGNOSIS — Z86711 Personal history of pulmonary embolism: Secondary | ICD-10-CM | POA: Diagnosis not present

## 2020-08-31 DIAGNOSIS — E039 Hypothyroidism, unspecified: Secondary | ICD-10-CM | POA: Diagnosis not present

## 2020-08-31 DIAGNOSIS — M6281 Muscle weakness (generalized): Secondary | ICD-10-CM | POA: Diagnosis not present

## 2020-08-31 DIAGNOSIS — K219 Gastro-esophageal reflux disease without esophagitis: Secondary | ICD-10-CM | POA: Diagnosis not present

## 2020-08-31 DIAGNOSIS — F0390 Unspecified dementia without behavioral disturbance: Secondary | ICD-10-CM | POA: Diagnosis not present

## 2020-08-31 DIAGNOSIS — R569 Unspecified convulsions: Secondary | ICD-10-CM | POA: Diagnosis not present

## 2020-08-31 DIAGNOSIS — F3289 Other specified depressive episodes: Secondary | ICD-10-CM | POA: Diagnosis not present

## 2020-08-31 DIAGNOSIS — R262 Difficulty in walking, not elsewhere classified: Secondary | ICD-10-CM | POA: Diagnosis not present

## 2020-08-31 DIAGNOSIS — I10 Essential (primary) hypertension: Secondary | ICD-10-CM | POA: Diagnosis not present

## 2020-09-01 DIAGNOSIS — K219 Gastro-esophageal reflux disease without esophagitis: Secondary | ICD-10-CM | POA: Diagnosis not present

## 2020-09-01 DIAGNOSIS — E039 Hypothyroidism, unspecified: Secondary | ICD-10-CM | POA: Diagnosis not present

## 2020-09-01 DIAGNOSIS — R262 Difficulty in walking, not elsewhere classified: Secondary | ICD-10-CM | POA: Diagnosis not present

## 2020-09-01 DIAGNOSIS — M6281 Muscle weakness (generalized): Secondary | ICD-10-CM | POA: Diagnosis not present

## 2020-09-01 DIAGNOSIS — F0390 Unspecified dementia without behavioral disturbance: Secondary | ICD-10-CM | POA: Diagnosis not present

## 2020-09-01 DIAGNOSIS — R569 Unspecified convulsions: Secondary | ICD-10-CM | POA: Diagnosis not present

## 2020-09-01 DIAGNOSIS — Z86711 Personal history of pulmonary embolism: Secondary | ICD-10-CM | POA: Diagnosis not present

## 2020-09-01 DIAGNOSIS — I10 Essential (primary) hypertension: Secondary | ICD-10-CM | POA: Diagnosis not present

## 2020-09-01 DIAGNOSIS — F3289 Other specified depressive episodes: Secondary | ICD-10-CM | POA: Diagnosis not present

## 2020-09-04 DIAGNOSIS — I10 Essential (primary) hypertension: Secondary | ICD-10-CM | POA: Diagnosis not present

## 2020-09-04 DIAGNOSIS — F0390 Unspecified dementia without behavioral disturbance: Secondary | ICD-10-CM | POA: Diagnosis not present

## 2020-09-04 DIAGNOSIS — Z86711 Personal history of pulmonary embolism: Secondary | ICD-10-CM | POA: Diagnosis not present

## 2020-09-04 DIAGNOSIS — E039 Hypothyroidism, unspecified: Secondary | ICD-10-CM | POA: Diagnosis not present

## 2020-09-04 DIAGNOSIS — M6281 Muscle weakness (generalized): Secondary | ICD-10-CM | POA: Diagnosis not present

## 2020-09-04 DIAGNOSIS — F3289 Other specified depressive episodes: Secondary | ICD-10-CM | POA: Diagnosis not present

## 2020-09-04 DIAGNOSIS — K219 Gastro-esophageal reflux disease without esophagitis: Secondary | ICD-10-CM | POA: Diagnosis not present

## 2020-09-04 DIAGNOSIS — R569 Unspecified convulsions: Secondary | ICD-10-CM | POA: Diagnosis not present

## 2020-09-04 DIAGNOSIS — R262 Difficulty in walking, not elsewhere classified: Secondary | ICD-10-CM | POA: Diagnosis not present

## 2020-09-05 DIAGNOSIS — R262 Difficulty in walking, not elsewhere classified: Secondary | ICD-10-CM | POA: Diagnosis not present

## 2020-09-05 DIAGNOSIS — K219 Gastro-esophageal reflux disease without esophagitis: Secondary | ICD-10-CM | POA: Diagnosis not present

## 2020-09-05 DIAGNOSIS — I10 Essential (primary) hypertension: Secondary | ICD-10-CM | POA: Diagnosis not present

## 2020-09-05 DIAGNOSIS — R569 Unspecified convulsions: Secondary | ICD-10-CM | POA: Diagnosis not present

## 2020-09-05 DIAGNOSIS — M6281 Muscle weakness (generalized): Secondary | ICD-10-CM | POA: Diagnosis not present

## 2020-09-05 DIAGNOSIS — E039 Hypothyroidism, unspecified: Secondary | ICD-10-CM | POA: Diagnosis not present

## 2020-09-05 DIAGNOSIS — F3289 Other specified depressive episodes: Secondary | ICD-10-CM | POA: Diagnosis not present

## 2020-09-05 DIAGNOSIS — F0390 Unspecified dementia without behavioral disturbance: Secondary | ICD-10-CM | POA: Diagnosis not present

## 2020-09-05 DIAGNOSIS — Z86711 Personal history of pulmonary embolism: Secondary | ICD-10-CM | POA: Diagnosis not present

## 2020-09-06 DIAGNOSIS — R569 Unspecified convulsions: Secondary | ICD-10-CM | POA: Diagnosis not present

## 2020-09-06 DIAGNOSIS — K219 Gastro-esophageal reflux disease without esophagitis: Secondary | ICD-10-CM | POA: Diagnosis not present

## 2020-09-06 DIAGNOSIS — E039 Hypothyroidism, unspecified: Secondary | ICD-10-CM | POA: Diagnosis not present

## 2020-09-06 DIAGNOSIS — F0390 Unspecified dementia without behavioral disturbance: Secondary | ICD-10-CM | POA: Diagnosis not present

## 2020-09-06 DIAGNOSIS — Z86711 Personal history of pulmonary embolism: Secondary | ICD-10-CM | POA: Diagnosis not present

## 2020-09-06 DIAGNOSIS — F3289 Other specified depressive episodes: Secondary | ICD-10-CM | POA: Diagnosis not present

## 2020-09-06 DIAGNOSIS — M6281 Muscle weakness (generalized): Secondary | ICD-10-CM | POA: Diagnosis not present

## 2020-09-06 DIAGNOSIS — I10 Essential (primary) hypertension: Secondary | ICD-10-CM | POA: Diagnosis not present

## 2020-09-06 DIAGNOSIS — R262 Difficulty in walking, not elsewhere classified: Secondary | ICD-10-CM | POA: Diagnosis not present

## 2020-09-26 DIAGNOSIS — F0390 Unspecified dementia without behavioral disturbance: Secondary | ICD-10-CM | POA: Diagnosis not present

## 2020-09-26 DIAGNOSIS — G4089 Other seizures: Secondary | ICD-10-CM | POA: Diagnosis not present

## 2020-09-26 DIAGNOSIS — E039 Hypothyroidism, unspecified: Secondary | ICD-10-CM | POA: Diagnosis not present

## 2020-09-26 DIAGNOSIS — Z86711 Personal history of pulmonary embolism: Secondary | ICD-10-CM | POA: Diagnosis not present

## 2020-09-26 DIAGNOSIS — M6281 Muscle weakness (generalized): Secondary | ICD-10-CM | POA: Diagnosis not present

## 2020-09-26 DIAGNOSIS — F3340 Major depressive disorder, recurrent, in remission, unspecified: Secondary | ICD-10-CM | POA: Diagnosis not present

## 2020-09-29 DIAGNOSIS — W07XXXA Fall from chair, initial encounter: Secondary | ICD-10-CM | POA: Diagnosis not present

## 2020-09-29 DIAGNOSIS — F0390 Unspecified dementia without behavioral disturbance: Secondary | ICD-10-CM | POA: Diagnosis not present

## 2020-09-29 DIAGNOSIS — M6281 Muscle weakness (generalized): Secondary | ICD-10-CM | POA: Diagnosis not present

## 2020-11-07 DIAGNOSIS — L603 Nail dystrophy: Secondary | ICD-10-CM | POA: Diagnosis not present

## 2020-11-07 DIAGNOSIS — E1151 Type 2 diabetes mellitus with diabetic peripheral angiopathy without gangrene: Secondary | ICD-10-CM | POA: Diagnosis not present

## 2020-11-07 DIAGNOSIS — B351 Tinea unguium: Secondary | ICD-10-CM | POA: Diagnosis not present

## 2020-11-07 DIAGNOSIS — Z794 Long term (current) use of insulin: Secondary | ICD-10-CM | POA: Diagnosis not present

## 2020-11-07 DIAGNOSIS — L602 Onychogryphosis: Secondary | ICD-10-CM | POA: Diagnosis not present

## 2020-11-07 DIAGNOSIS — B353 Tinea pedis: Secondary | ICD-10-CM | POA: Diagnosis not present

## 2020-11-14 DIAGNOSIS — G4089 Other seizures: Secondary | ICD-10-CM | POA: Diagnosis not present

## 2020-11-14 DIAGNOSIS — E039 Hypothyroidism, unspecified: Secondary | ICD-10-CM | POA: Diagnosis not present

## 2020-11-14 DIAGNOSIS — F0390 Unspecified dementia without behavioral disturbance: Secondary | ICD-10-CM | POA: Diagnosis not present

## 2020-11-14 DIAGNOSIS — Z86711 Personal history of pulmonary embolism: Secondary | ICD-10-CM | POA: Diagnosis not present

## 2020-11-14 DIAGNOSIS — F3340 Major depressive disorder, recurrent, in remission, unspecified: Secondary | ICD-10-CM | POA: Diagnosis not present

## 2020-11-14 DIAGNOSIS — M6281 Muscle weakness (generalized): Secondary | ICD-10-CM | POA: Diagnosis not present

## 2020-11-20 DIAGNOSIS — R404 Transient alteration of awareness: Secondary | ICD-10-CM | POA: Diagnosis not present

## 2020-11-20 DIAGNOSIS — G4089 Other seizures: Secondary | ICD-10-CM | POA: Diagnosis not present

## 2020-11-20 DIAGNOSIS — I499 Cardiac arrhythmia, unspecified: Secondary | ICD-10-CM | POA: Diagnosis not present

## 2020-11-20 DIAGNOSIS — I469 Cardiac arrest, cause unspecified: Secondary | ICD-10-CM | POA: Diagnosis not present

## 2020-11-20 DIAGNOSIS — Z86711 Personal history of pulmonary embolism: Secondary | ICD-10-CM | POA: Diagnosis not present

## 2020-11-20 DIAGNOSIS — R000 Tachycardia, unspecified: Secondary | ICD-10-CM | POA: Diagnosis not present

## 2020-12-12 DIAGNOSIS — 419620001 Death: Secondary | SNOMED CT | POA: Diagnosis not present

## 2020-12-12 DEATH — deceased
